# Patient Record
Sex: Male | Born: 1950 | Race: White | Hispanic: No | Marital: Married | Smoking: Former smoker
Health system: Southern US, Community
[De-identification: ages and names within clinical notes are randomized; demographics above are authoritative.]

## PROBLEM LIST (undated history)

## (undated) DIAGNOSIS — I5042 Chronic combined systolic (congestive) and diastolic (congestive) heart failure: Secondary | ICD-10-CM

## (undated) DIAGNOSIS — L989 Disorder of the skin and subcutaneous tissue, unspecified: Secondary | ICD-10-CM

## (undated) DIAGNOSIS — I10 Essential (primary) hypertension: Secondary | ICD-10-CM

## (undated) DIAGNOSIS — G4730 Sleep apnea, unspecified: Secondary | ICD-10-CM

## (undated) DIAGNOSIS — M1710 Unilateral primary osteoarthritis, unspecified knee: Secondary | ICD-10-CM

## (undated) DIAGNOSIS — N529 Male erectile dysfunction, unspecified: Secondary | ICD-10-CM

## (undated) DIAGNOSIS — I4891 Unspecified atrial fibrillation: Secondary | ICD-10-CM

## (undated) DIAGNOSIS — R7301 Impaired fasting glucose: Secondary | ICD-10-CM

## (undated) DIAGNOSIS — I2510 Atherosclerotic heart disease of native coronary artery without angina pectoris: Secondary | ICD-10-CM

## (undated) DIAGNOSIS — K219 Gastro-esophageal reflux disease without esophagitis: Secondary | ICD-10-CM

## (undated) DIAGNOSIS — R5383 Other fatigue: Secondary | ICD-10-CM

## (undated) DIAGNOSIS — E785 Hyperlipidemia, unspecified: Secondary | ICD-10-CM

## (undated) DIAGNOSIS — F329 Major depressive disorder, single episode, unspecified: Secondary | ICD-10-CM

## (undated) DIAGNOSIS — F419 Anxiety disorder, unspecified: Secondary | ICD-10-CM

## (undated) DIAGNOSIS — F32A Depression, unspecified: Secondary | ICD-10-CM

## (undated) HISTORY — DX: Anxiety disorder, unspecified: F41.9

## (undated) HISTORY — DX: Hyperlipidemia, unspecified: E78.5

## (undated) HISTORY — PX: KNEE SURGERY: SHX244

## (undated) HISTORY — DX: Male erectile dysfunction, unspecified: N52.9

## (undated) HISTORY — DX: Major depressive disorder, single episode, unspecified: F32.9

## (undated) HISTORY — DX: Unspecified atrial fibrillation: I48.91

## (undated) HISTORY — DX: Other fatigue: R53.83

## (undated) HISTORY — DX: Unilateral primary osteoarthritis, unspecified knee: M17.10

## (undated) HISTORY — DX: Impaired fasting glucose: R73.01

## (undated) HISTORY — DX: Disorder of the skin and subcutaneous tissue, unspecified: L98.9

## (undated) HISTORY — DX: Depression, unspecified: F32.A

## (undated) HISTORY — DX: Essential (primary) hypertension: I10

## (undated) HISTORY — DX: Atherosclerotic heart disease of native coronary artery without angina pectoris: I25.10

## (undated) HISTORY — PX: HERNIA REPAIR: SHX51

## (undated) HISTORY — PX: WISDOM TOOTH EXTRACTION: SHX21

## (undated) HISTORY — PX: TONSILLECTOMY: SUR1361

## (undated) HISTORY — DX: Chronic combined systolic (congestive) and diastolic (congestive) heart failure: I50.42

---

## 2007-03-04 ENCOUNTER — Emergency Department (HOSPITAL_COMMUNITY): Admission: EM | Admit: 2007-03-04 | Discharge: 2007-03-04 | Payer: Self-pay | Admitting: Emergency Medicine

## 2009-03-02 ENCOUNTER — Encounter (INDEPENDENT_AMBULATORY_CARE_PROVIDER_SITE_OTHER): Payer: Self-pay | Admitting: General Practice

## 2009-12-19 DIAGNOSIS — E785 Hyperlipidemia, unspecified: Secondary | ICD-10-CM

## 2009-12-19 DIAGNOSIS — I10 Essential (primary) hypertension: Secondary | ICD-10-CM

## 2009-12-19 DIAGNOSIS — M179 Osteoarthritis of knee, unspecified: Secondary | ICD-10-CM

## 2009-12-19 DIAGNOSIS — F419 Anxiety disorder, unspecified: Secondary | ICD-10-CM

## 2009-12-19 DIAGNOSIS — F32A Depression, unspecified: Secondary | ICD-10-CM

## 2009-12-19 DIAGNOSIS — R7301 Impaired fasting glucose: Secondary | ICD-10-CM | POA: Insufficient documentation

## 2009-12-19 DIAGNOSIS — Z008 Encounter for other general examination: Secondary | ICD-10-CM | POA: Insufficient documentation

## 2009-12-19 DIAGNOSIS — R5383 Other fatigue: Secondary | ICD-10-CM | POA: Insufficient documentation

## 2009-12-19 DIAGNOSIS — Z79899 Other long term (current) drug therapy: Secondary | ICD-10-CM | POA: Insufficient documentation

## 2009-12-19 DIAGNOSIS — M1710 Unilateral primary osteoarthritis, unspecified knee: Secondary | ICD-10-CM

## 2009-12-19 DIAGNOSIS — K921 Melena: Secondary | ICD-10-CM | POA: Insufficient documentation

## 2009-12-19 HISTORY — DX: Osteoarthritis of knee, unspecified: M17.9

## 2009-12-19 HISTORY — DX: Depression, unspecified: F32.A

## 2009-12-19 HISTORY — DX: Anxiety disorder, unspecified: F41.9

## 2009-12-19 HISTORY — DX: Unilateral primary osteoarthritis, unspecified knee: M17.10

## 2009-12-19 HISTORY — DX: Essential (primary) hypertension: I10

## 2009-12-19 HISTORY — DX: Other fatigue: R53.83

## 2009-12-19 HISTORY — DX: Hyperlipidemia, unspecified: E78.5

## 2009-12-19 HISTORY — DX: Impaired fasting glucose: R73.01

## 2010-06-05 ENCOUNTER — Encounter (INDEPENDENT_AMBULATORY_CARE_PROVIDER_SITE_OTHER): Payer: Self-pay | Admitting: *Deleted

## 2010-07-25 ENCOUNTER — Encounter (INDEPENDENT_AMBULATORY_CARE_PROVIDER_SITE_OTHER): Payer: Self-pay | Admitting: *Deleted

## 2010-07-26 ENCOUNTER — Ambulatory Visit: Payer: Self-pay | Admitting: Gastroenterology

## 2010-07-31 ENCOUNTER — Ambulatory Visit
Admission: RE | Admit: 2010-07-31 | Discharge: 2010-07-31 | Payer: Self-pay | Source: Home / Self Care | Attending: Gastroenterology | Admitting: Gastroenterology

## 2010-08-08 ENCOUNTER — Telehealth: Payer: Self-pay | Admitting: Gastroenterology

## 2010-08-09 ENCOUNTER — Ambulatory Visit: Admit: 2010-08-09 | Payer: Self-pay | Admitting: Gastroenterology

## 2010-08-28 NOTE — Letter (Signed)
Summary: Pre Visit Letter Revised  [facility]  [address] [place], [place] [address]   Phone: [phone]  Fax: [phone]        06/05/2010 MRN: [id number] [facility] [address] [place], [place]  [address]             Procedure Date:  08-09-10   Welcome to the Gastroenterology Division at [facility].    You are scheduled to see a nurse for your pre-procedure visit on 07-26-10 at 8:00a.m. on the 3rd floor at [facility], [address]  We ask that you try to arrive at our office 15 minutes prior to your appointment time to allow for check-in.  Please take a minute to review the attached form.  If you answer "Yes" to one or more of the questions on the first page, we ask that you call the person listed at your earliest opportunity.  If you answer "No" to all of the questions, please complete the rest of the form and bring it to your appointment.    Your nurse visit will consist of discussing your medical and surgical history, your immediate family medical history, and your medications.   If you are unable to list all of your medications on the form, please bring the medication bottles to your appointment and we will list them.  We will need to be aware of both prescribed and over the counter drugs.  We will need to know exact dosage information as well.    Please be prepared to read and sign documents such as consent forms, a financial agreement, and acknowledgement forms.  If necessary, and with your consent, a friend or relative is welcome to sit-in on the nurse visit with you.  Please bring your insurance card so that we may make a copy of it.  If your insurance requires a referral to see a specialist, please bring your referral form from your primary care physician.  No co-pay is required for this nurse visit.     If you cannot keep your appointment, please call [phone] to cancel or reschedule prior to your appointment date.  This  allows [place] the opportunity to schedule an appointment for another patient in need of care.    Thank you for choosing [facility] for your medical needs.  We appreciate the opportunity to care for you.  Please visit [place] at our website  to learn more about our practice.  Sincerely, The Gastroenterology Division

## 2010-08-30 NOTE — Letter (Signed)
Summary: [facility] Instructions  [facility]  [address] [place], [place] [address]   Phone: [phone]  Fax: [phone]       Abdulla Thackeray    Nov 24, 1950    MRN: [id number]        Procedure Day /Date:  Thursday 08/09/2010     Arrival Time: 7:30 am      Procedure Time:  8:30 am     Location of Procedure:                    _ x_  [facility] (4th Floor)                        PREPARATION FOR COLONOSCOPY WITH MOVIPREP   Starting 5 days prior to your procedure Saturday 08/04/10 do not eat nuts, seeds, popcorn, corn, beans, peas,  salads, or any raw vegetables.  Do not take any fiber supplements (e.g. Metamucil, Citrucel, and Benefiber).  THE DAY BEFORE YOUR PROCEDURE         DATE: Wednesday 08/08/10  1.  Drink clear liquids the entire day-NO SOLID FOOD  2.  Do not drink anything colored red or purple.  Avoid juices with pulp.  No orange juice.  3.  Drink at least 64 oz. (8 glasses) of fluid/clear liquids during the day to prevent dehydration and help the prep work efficiently.  CLEAR LIQUIDS INCLUDE: Water Jello Ice Popsicles Tea (sugar ok, no milk/cream) Powdered fruit flavored drinks Coffee (sugar ok, no milk/cream) Gatorade Juice: apple, white grape, white cranberry  Lemonade Clear bullion, consomm, broth Carbonated beverages (any kind) Strained chicken noodle soup Hard Candy                             4.  In the morning, mix first dose of MoviPrep solution:    Empty 1 Pouch A and 1 Pouch B into the disposable container    Add lukewarm drinking water to the top line of the container. Mix to dissolve    Refrigerate (mixed solution should be used within 24 hrs)  5.  Begin drinking the prep at 5:00 p.m. The MoviPrep container is divided by 4 marks.   Every 15 minutes drink the solution down to the next mark (approximately 8 oz) until the full liter is complete.   6.  Follow completed prep with 16 oz of clear liquid of your choice  (Nothing red or purple).  Continue to drink clear liquids until bedtime.  7.  Before going to bed, mix second dose of MoviPrep solution:    Empty 1 Pouch A and 1 Pouch B into the disposable container    Add lukewarm drinking water to the top line of the container. Mix to dissolve    Refrigerate  THE DAY OF YOUR PROCEDURE      DATE: Thursday 08/09/10  Beginning at 3:30 a.m. (5 hours before procedure):         1. Every 15 minutes, drink the solution down to the next mark (approx 8 oz) until the full liter is complete.  2. Follow completed prep with 16 oz. of clear liquid of your choice.    3. You may drink clear liquids until 6:30 am (2 HOURS BEFORE PROCEDURE).   MEDICATION INSTRUCTIONS  Unless otherwise instructed, you should take regular prescription medications with a small sip of water   as early as possible the morning  of your procedure.  Additional medication instruction:  Hold Lisinopril/HCTZ the morning of procedure.         OTHER INSTRUCTIONS  You will need a responsible adult at least 59 years of age to accompany you and drive you home.   This person must remain in the waiting room during your procedure.  Wear loose fitting clothing that is easily removed.  Leave jewelry and other valuables at home.  However, you may wish to bring a book to read or  an iPod/MP3 player to listen to music as you wait for your procedure to start.  Remove all body piercing jewelry and leave at home.  Total time from sign-in until discharge is approximately 2-3 hours.  You should go home directly after your procedure and rest.  You can resume normal activities the  day after your procedure.  The day of your procedure you should not:   Drive   Make legal decisions   Operate machinery   Drink alcohol   Return to work  You will receive specific instructions about eating, activities and medications before you leave.    The above instructions have been reviewed and  explained to me by   Wyona Almas RN  July 31, 2010 10:06 AM     I fully understand and can verbalize these instructions _____________________________ Date _________

## 2010-08-30 NOTE — Miscellaneous (Signed)
Summary: [facility] Previsit/prep  Clinical Lists Changes  Medications: Added new medication of MOVIPREP 100 GM  SOLR (PEG-KCL-NACL-NASULF-NA ASC-C) As per prep instructions. - Signed Rx of MOVIPREP 100 GM  SOLR (PEG-KCL-NACL-NASULF-NA ASC-C) As per prep instructions.;  #1 x 0;  Signed;  Entered by: Wyona Almas RN;  Authorized by: Meryl Dare MD Clementeen Graham;  Method used: Electronically to [id number] [address], [place], [place], [place]  [address], Ph: [phone] or [phone], Fax: [phone] Observations: Added new observation of NKA: T (07/31/2010 9:42)    Prescriptions: MOVIPREP 100 GM  SOLR (PEG-KCL-NACL-NASULF-NA ASC-C) As per prep instructions.  #1 x 0   Entered by:   Wyona Almas RN   Authorized by:   Meryl Dare MD [facility]   Signed by:   Wyona Almas RN on 07/31/2010   Method used:   Electronically to        [facility] [phone]* (retail)       [address]       [place], [place]  [address]       Ph: [phone] or [phone]       Fax: [phone]   RxID:   [id number]

## 2010-08-30 NOTE — Progress Notes (Signed)
Summary: Procedure tomorrow  Phone Note Call from Patient Call back at Home Phone [phone] Call back at Work Phone [phone]   Call For: Dr Russella Dar Summary of Call: Scheduled for a procedure tomorrow and his friend at work told him he was unable to drink the moviprep. Wants to speak to nurse because he thinks he wont be able to drink it either. Initial call taken by: Leanor Kail [facility],  August 08, 2010 10:39 AM  Follow-up for Phone Call        instructed pt. to please try to drink prep that he may be able to drink it even though his friend was not able to drink it and that if he is having trouble getting it down in the . intervals that he could drink it at a slower pace and if he has a problem than call the on call Dr. if having any problems with prep. Pt. stated that he would try. Follow-up by: Greer Ee RN,  August 08, 2010 1:16 PM

## 2010-09-05 NOTE — Progress Notes (Signed)
Summary: Canceled COL  Phone Note Call from Patient Call back at [facility] Phone [phone]   Caller: Patient Call For: Dr. Russella Dar Summary of Call: pt. canceled his COL for tomorrow b/c his deductible is too high and he cannot afford it right now. Would you like pt. charge the cancelation fee? Initial call taken by: Karna Christmas,  August 08, 2010 4:58 PM  Follow-up for Phone Call        Yes to charging cancellation fee. Follow-up by: Meryl Dare MD FACG,  August 08, 2010 10:29 PM  Additional Follow-up for Phone Call Additional follow up Details #1::        Patient BILLED.  Additional Follow-up by: Leanor Kail [facility],  August 28, 2010 5:03 PM

## 2010-11-16 DIAGNOSIS — M79643 Pain in unspecified hand: Secondary | ICD-10-CM | POA: Insufficient documentation

## 2011-05-13 LAB — I-STAT 8, (EC8 V) (CONVERTED LAB)
Acid-Base Excess: 2
Bicarbonate: 25.8 — ABNORMAL HIGH
Chloride: 103
HCT: 43
Operator id: 282201
TCO2: 27
pCO2, Ven: 36.2 — ABNORMAL LOW

## 2011-05-13 LAB — CBC
HCT: 41.2
Hemoglobin: 14.2
Platelets: 219
WBC: 4.9

## 2011-05-13 LAB — POCT CARDIAC MARKERS
CKMB, poc: 4.8
Operator id: 282201
Troponin i, poc: <0.05

## 2011-05-13 LAB — DIFFERENTIAL
Eosinophils Relative: 3
Lymphocytes Relative: 27
Lymphs Abs: 1.4

## 2011-05-13 LAB — POCT I-STAT CREATININE: Operator id: 282201

## 2011-08-16 DIAGNOSIS — J111 Influenza due to unidentified influenza virus with other respiratory manifestations: Secondary | ICD-10-CM | POA: Insufficient documentation

## 2011-09-02 ENCOUNTER — Encounter: Payer: Self-pay | Admitting: Gastroenterology

## 2011-09-02 DIAGNOSIS — L989 Disorder of the skin and subcutaneous tissue, unspecified: Secondary | ICD-10-CM

## 2011-09-02 DIAGNOSIS — N529 Male erectile dysfunction, unspecified: Secondary | ICD-10-CM

## 2011-09-02 HISTORY — DX: Male erectile dysfunction, unspecified: N52.9

## 2011-09-02 HISTORY — DX: Disorder of the skin and subcutaneous tissue, unspecified: L98.9

## 2011-09-26 ENCOUNTER — Encounter: Payer: Self-pay | Admitting: Gastroenterology

## 2011-10-16 DIAGNOSIS — R42 Dizziness and giddiness: Secondary | ICD-10-CM | POA: Insufficient documentation

## 2013-08-09 DIAGNOSIS — R05 Cough: Secondary | ICD-10-CM | POA: Insufficient documentation

## 2013-08-09 DIAGNOSIS — R059 Cough, unspecified: Secondary | ICD-10-CM | POA: Insufficient documentation

## 2013-09-20 DIAGNOSIS — Z1389 Encounter for screening for other disorder: Secondary | ICD-10-CM | POA: Insufficient documentation

## 2013-09-20 DIAGNOSIS — I4891 Unspecified atrial fibrillation: Secondary | ICD-10-CM

## 2013-09-20 DIAGNOSIS — I480 Paroxysmal atrial fibrillation: Secondary | ICD-10-CM | POA: Insufficient documentation

## 2013-09-20 HISTORY — DX: Unspecified atrial fibrillation: I48.91

## 2013-09-21 ENCOUNTER — Institutional Professional Consult (permissible substitution): Payer: Self-pay | Admitting: Cardiology

## 2013-09-22 ENCOUNTER — Ambulatory Visit (INDEPENDENT_AMBULATORY_CARE_PROVIDER_SITE_OTHER): Payer: BC Managed Care – PPO | Admitting: Cardiovascular Disease

## 2013-09-22 ENCOUNTER — Other Ambulatory Visit: Payer: Self-pay | Admitting: Cardiology

## 2013-09-22 ENCOUNTER — Other Ambulatory Visit: Payer: Self-pay | Admitting: *Deleted

## 2013-09-22 ENCOUNTER — Encounter: Payer: Self-pay | Admitting: Cardiovascular Disease

## 2013-09-22 VITALS — BP 120/84 | HR 76 | Ht 68.0 in | Wt 195.0 lb

## 2013-09-22 DIAGNOSIS — I10 Essential (primary) hypertension: Secondary | ICD-10-CM

## 2013-09-22 DIAGNOSIS — I4891 Unspecified atrial fibrillation: Secondary | ICD-10-CM

## 2013-09-22 LAB — T4, FREE: FREE T4: 0.9 ng/dL (ref 0.60–1.60)

## 2013-09-22 LAB — TSH: TSH: 1.28 u[IU]/mL (ref 0.35–5.50)

## 2013-09-22 LAB — BASIC METABOLIC PANEL
BUN: 19 mg/dL (ref 6–23)
CHLORIDE: 102 meq/L (ref 96–112)
CO2: 27 mEq/L (ref 19–32)
CREATININE: 1 mg/dL (ref 0.4–1.5)
Calcium: 9.2 mg/dL (ref 8.4–10.5)
GFR: 81.43 mL/min (ref >60.00)
Glucose, Bld: 76 mg/dL (ref 70–99)
POTASSIUM: 3.2 meq/L — AB (ref 3.5–5.1)
SODIUM: 137 meq/L (ref 135–145)

## 2013-09-22 LAB — CBC WITH DIFFERENTIAL/PLATELET
BASOS ABS: 0 10*3/uL (ref 0.0–0.1)
Basophils Relative: 0.3 % (ref 0.0–3.0)
EOS ABS: 0.1 10*3/uL (ref 0.0–0.7)
Eosinophils Relative: 1.5 % (ref 0.0–5.0)
HCT: 46.8 % (ref 39.0–52.0)
Hemoglobin: 15.3 g/dL (ref 13.0–17.0)
LYMPHS PCT: 25.9 % (ref 12.0–46.0)
Lymphs Abs: 1.4 10*3/uL (ref 0.7–4.0)
MCHC: 32.6 g/dL (ref 30.0–36.0)
MCV: 97.1 fl (ref 78.0–100.0)
Monocytes Absolute: 0.5 10*3/uL (ref 0.1–1.0)
Monocytes Relative: 9.8 % (ref 3.0–12.0)
NEUTROS PCT: 62.5 % (ref 43.0–77.0)
Neutro Abs: 3.3 10*3/uL (ref 1.4–7.7)
PLATELETS: 221 10*3/uL (ref 150.0–400.0)
RBC: 4.82 Mil/uL (ref 4.22–5.81)
RDW: 12.9 % (ref 11.5–14.6)
WBC: 5.3 10*3/uL (ref 4.5–10.5)

## 2013-09-22 MED ORDER — APIXABAN 5 MG PO TABS: 5.0000 mg | ORAL_TABLET | Freq: Two times a day (BID) | ORAL | Status: DC

## 2013-09-22 NOTE — Assessment & Plan Note (Signed)
Well controlled.  Continue current medications and low sodium Dash type diet.    

## 2013-09-22 NOTE — Progress Notes (Signed)
Patient ID: Juan Montes, male   DOB: 04/04/1951, 63 y.o.   MRN: [id number]  63 yo patient of Dr Abner Greenspan referred for afib.  No history of cardiac issues.  Over 10 years ago went to [facility] for chest pain but was hypokalemic and thought due to cramps.  Had URI couple of months ago and has not felt well since. Fatigue dyspnea and dizziness .  Doesn't have stamina.  Does not note palpitations. Started on ASA and metoprolol  CHADVASC score 1.  ETOH most nights but only a beer.  Chest gets tight during day but seems more related to fatigue than exertion No history of MI.  No cough fever Still has head congestion No family history of DCM.  No edema or swelling.  No bleeding issues or contraindications to anticoagulation  Has well Rx HTN     ROS: Denies fever, malais, weight loss, blurry vision, decreased visual acuity, cough, sputum, SOB, hemoptysis, pleuritic pain, palpitaitons, heartburn, abdominal pain, melena, lower extremity edema, claudication, or rash.  All other systems reviewed and negative   General: Affect appropriate Healthy:  appears stated age 63: normal Neck supple with no adenopathy JVP normal no bruits no thyromegaly Lungs clear with no wheezing and good diaphragmatic motion Heart:  S1/S2 no murmur,rub, gallop or click PMI normal Abdomen: benighn, BS positve, no tenderness, no AAA previous right hernia repair  no bruit.  No HSM or HJR Distal pulses intact with no bruits No edema Neuro non-focal Skin warm and dry No muscular weakness Previous left knee surgery   Medications Current Outpatient Prescriptions  Medication Sig Dispense Refill  . lisinopril-hydrochlorothiazide (PRINZIDE,ZESTORETIC) 20-25 MG per tablet Take 1 tablet by mouth daily.      . metoprolol tartrate (LOPRESSOR) 25 MG tablet Take 25 mg by mouth 2 (two) times daily.      . sertraline (ZOLOFT) 100 MG tablet 100 mg daily.      Marland Kitchen apixaban (ELIQUIS) 5 MG TABS tablet Take 1 tablet (5 mg total) by mouth 2 (two)  times daily.  60 tablet  3   No current facility-administered medications for this visit.    Allergies Review of patient's allergies indicates not on file.  Family History: Family History  Problem Relation Age of Onset  . Hypertension Father   . Kidney Stones Mother   . Other Father     kidney problems    Social History: History   Social History  . Marital Status: Married    Spouse Name: N/A    Number of Children: N/A  . Years of Education: N/A   Occupational History  . Not on file.   Social History Main Topics  . Smoking status: Light Tobacco Smoker  . Smokeless tobacco: Not on file     Comment: occasional cigar  . Alcohol Use: No  . Drug Use: No  . Sexual Activity: Not on file   Other Topics Concern  . Not on file   Social History Narrative  . No narrative on file    Electrocardiogram: afib rate controlled no ST/ T wave changes   Assessment and Plan

## 2013-09-22 NOTE — Patient Instructions (Signed)
Your physician recommends that you schedule a follow-up appointment in:  NEXT AVAILABLE WITH DR [facility]  Your physician has recommended you make the following change in your medication:  START  ELIQUIS   5 MG  Reader physician has requested that you have an echocardiogram. Echocardiography is a painless test that uses sound waves to create images of your heart. It provides your doctor with information about the size and shape of your heart and how well your heart's chambers and valves are working. This procedure takes approximately one hour. There are no restrictions for this procedure.  Your physician recommends that you return for lab work in:  TODAY  BMET  CBC   TSH  FREE T4

## 2013-09-22 NOTE — Assessment & Plan Note (Signed)
Long discussion regarding diagnosis , Rx and risk of stroke.  Try to limit ETOH  Check labs to include CBC, BMET and TSH.  Echo to assess EF and atrial size  Concerned that he may have DCM given fatigue and dyspnea.  Start Eliquis with eye torward [place] in 4 weeks.  Will need ETT at some point but prefer to see what echo shows and take care of afib first.

## 2013-09-22 NOTE — Addendum Note (Signed)
Addended by: Phineas Inches D on: 09/22/2013 01:53 PM   Modules accepted: Orders

## 2013-09-28 ENCOUNTER — Institutional Professional Consult (permissible substitution): Payer: Self-pay | Admitting: Internal Medicine

## 2013-10-08 ENCOUNTER — Encounter: Payer: Self-pay | Admitting: Cardiology

## 2013-10-08 ENCOUNTER — Ambulatory Visit (HOSPITAL_COMMUNITY): Payer: BC Managed Care – PPO | Attending: Cardiology | Admitting: Cardiology

## 2013-10-08 DIAGNOSIS — I4891 Unspecified atrial fibrillation: Secondary | ICD-10-CM

## 2013-10-08 NOTE — Progress Notes (Signed)
Echo performed. 

## 2013-10-11 ENCOUNTER — Other Ambulatory Visit: Payer: Self-pay | Admitting: *Deleted

## 2013-10-11 DIAGNOSIS — I8290 Acute embolism and thrombosis of unspecified vein: Secondary | ICD-10-CM

## 2013-10-19 ENCOUNTER — Ambulatory Visit (INDEPENDENT_AMBULATORY_CARE_PROVIDER_SITE_OTHER): Payer: BC Managed Care – PPO | Admitting: Cardiovascular Disease

## 2013-10-19 ENCOUNTER — Encounter: Payer: Self-pay | Admitting: Cardiovascular Disease

## 2013-10-19 VITALS — BP 130/86 | HR 59 | Ht 68.0 in | Wt 198.0 lb

## 2013-10-19 DIAGNOSIS — R0989 Other specified symptoms and signs involving the circulatory and respiratory systems: Secondary | ICD-10-CM

## 2013-10-19 DIAGNOSIS — I10 Essential (primary) hypertension: Secondary | ICD-10-CM

## 2013-10-19 DIAGNOSIS — R0600 Dyspnea, unspecified: Secondary | ICD-10-CM | POA: Insufficient documentation

## 2013-10-19 DIAGNOSIS — R0609 Other forms of dyspnea: Secondary | ICD-10-CM

## 2013-10-19 DIAGNOSIS — I4891 Unspecified atrial fibrillation: Secondary | ICD-10-CM

## 2013-10-19 NOTE — Assessment & Plan Note (Signed)
Well controlled.  Continue current medications and low sodium Dash type diet.    

## 2013-10-19 NOTE — Progress Notes (Signed)
Patient ID: Juan Montes, male   DOB: [birth year], 63 y.o.   MRN: [id number] 63 yo patient of Dr Abner Greenspan referred for afib. No history of cardiac issues. Over 10 years ago went to [facility] for chest pain but was hypokalemic and thought due to cramps. Had URI couple of months ago and has not felt well since. Fatigue dyspnea and dizziness . Doesn't have stamina. Does not note palpitations. Started on ASA and metoprolol CHADVASC score 1. ETOH most nights but only a beer. Chest gets tight during day but seems more related to fatigue than exertion No history of MI. No cough fever Still has head congestion  No family history of DCM. No edema or swelling. No bleeding issues or contraindications to anticoagulation Has well Rx HTN    F/u echo 3/23  With ? Apical thrombus EF 50-55%   Started on Eliquis last visit.  Has converted to NSR   No further pain, dyspnea or fatigue  MRI has not been scheduled yet     ROS: Denies fever, malais, weight loss, blurry vision, decreased visual acuity, cough, sputum, SOB, hemoptysis, pleuritic pain, palpitaitons, heartburn, abdominal pain, melena, lower extremity edema, claudication, or rash.  All other systems reviewed and negative  General: Affect appropriate Healthy:  appears stated age 63: normal Neck supple with no adenopathy JVP normal no bruits no thyromegaly Lungs clear with no wheezing and good diaphragmatic motion Heart:  S1/S2 no murmur, no rub, gallop or click PMI normal Abdomen: benighn, BS positve, no tenderness, no AAA no bruit.  No HSM or HJR Distal pulses intact with no bruits No edema Neuro non-focal Skin warm and dry No muscular weakness   Current Outpatient Prescriptions  Medication Sig Dispense Refill  . apixaban (ELIQUIS) 5 MG TABS tablet Take 1 tablet (5 mg total) by mouth 2 (two) times daily.  60 tablet  1  . calcium carbonate (TUMS) 500 MG chewable tablet 500 mg.      . lisinopril-hydrochlorothiazide (PRINZIDE,ZESTORETIC) 20-25 MG  per tablet Take 1 tablet by mouth daily.      . metoprolol tartrate (LOPRESSOR) 25 MG tablet Take 25 mg by mouth 2 (two) times daily.      . sertraline (ZOLOFT) 100 MG tablet 100 mg daily.       No current facility-administered medications for this visit.    Allergies  Celebrex  Electrocardiogram:  NSR rate 59  Normal ECG   Assessment and Plan

## 2013-10-19 NOTE — Assessment & Plan Note (Signed)
Dyspnea with EF 50-55% on echo CRF;s  F/U ETT to r/o CAD and assess BP response to exercise

## 2013-10-19 NOTE — Patient Instructions (Addendum)
Your physician recommends that you schedule a follow-up appointment in:  3 months with [place]  Your physician has requested that you have an exercise tolerance test. Can be done with NP or PA.  For further information please visit [email or web address]. Please also follow instruction sheet, as given.   Your physician has requested that you have a cardiac MRI. Cardiac MRI uses a computer to create images of your heart as its beating, producing both still and moving pictures of your heart and major blood vessels. For further information please visit [email or web address] Please follow the instruction sheet given to you today for more information. Scheduled for October 20, 2013. Please arrive at [facility] at 11:45.

## 2013-10-19 NOTE — Assessment & Plan Note (Signed)
Converted to NSR  Need cardiac MRI to r/o thrombus before stopping eliquis  Would be unusual to have this without distinct RWMA  Can also assess LAA given recent PAF

## 2013-10-20 ENCOUNTER — Ambulatory Visit (HOSPITAL_COMMUNITY)
Admission: RE | Admit: 2013-10-20 | Discharge: 2013-10-20 | Disposition: A | Discharge status: Home / Self Care | Payer: BC Managed Care – PPO | Source: Ambulatory Visit | Attending: Cardiovascular Disease | Admitting: Cardiovascular Disease

## 2013-10-20 DIAGNOSIS — I749 Embolism and thrombosis of unspecified artery: Secondary | ICD-10-CM

## 2013-10-20 DIAGNOSIS — I517 Cardiomegaly: Secondary | ICD-10-CM | POA: Insufficient documentation

## 2013-10-20 DIAGNOSIS — I8290 Acute embolism and thrombosis of unspecified vein: Secondary | ICD-10-CM

## 2013-10-20 MED ORDER — GADOBENATE DIMEGLUMINE 529 MG/ML IV SOLN
30.0000 mL | Freq: Once | INTRAVENOUS | Status: AC | PRN
  Administered 2013-10-20: 30 mL via INTRAVENOUS

## 2013-11-03 ENCOUNTER — Ambulatory Visit (INDEPENDENT_AMBULATORY_CARE_PROVIDER_SITE_OTHER): Payer: BC Managed Care – PPO

## 2013-11-03 DIAGNOSIS — R0609 Other forms of dyspnea: Secondary | ICD-10-CM

## 2013-11-03 DIAGNOSIS — R0600 Dyspnea, unspecified: Secondary | ICD-10-CM

## 2013-11-03 DIAGNOSIS — R0989 Other specified symptoms and signs involving the circulatory and respiratory systems: Secondary | ICD-10-CM

## 2013-11-03 DIAGNOSIS — I4891 Unspecified atrial fibrillation: Secondary | ICD-10-CM

## 2013-11-03 NOTE — Progress Notes (Signed)
Exercise Treadmill Test  Pre-Exercise Testing Evaluation Rhythm: normal sinus  Rate: 74 bpm     Test  Exercise Tolerance Test Ordering MD: Jenkins Rouge, MD  Interpreting MD: Melina Copa, PA  Unique Test No: 1  Treadmill:  1  Indication for ETT: exertional dyspnea/afib  Contraindication to ETT: No   Stress Modality: exercise - treadmill  Cardiac Imaging Performed: non   Protocol: standard Bruce - maximal  Max BP:  200/102  Max MPHR (bpm):  158 85% MPR (bpm):  134  MPHR obtained (bpm):  164 % MPHR obtained:  103%  Reached 85% MPHR (min:sec):  05:34 Total Exercise Time (min-sec):  08:59  Workload in METS:  10.1 Borg Scale: 17  Reason ETT Terminated:  dyspnea, history of hip pain, target HR achieved    ST Segment Analysis At Rest: normal ST segments - no evidence of significant ST depression, rare PVC With Exercise: no evidence of significant ST depression  Other Information Arrhythmia:  No sustained arrhythmias - rare PVC, one couplet  Angina during ETT:  absent (0) Quality of ETT:  diagnostic  ETT Interpretation:  normal - no evidence of ischemia by ST analysis  Comments: Excellent exercise tolerance.  Minor upsloping ST depression (<0.16mm) inf-lat but no significant ST changes suggestive of ischemia. No ST elevation. No chest pain. Mild dyspnea towards end of test but still able to conversate. Hypertensive response to exercise at very end. Rare PVC. Patient was asymptomatic in recovery.  Recommendations: No significant changes or symptoms to suggest ischemia. Followup with Dr. Johnsie Cancel as planned.

## 2013-11-11 ENCOUNTER — Other Ambulatory Visit (HOSPITAL_COMMUNITY): Payer: BC Managed Care – PPO

## 2013-12-03 ENCOUNTER — Telehealth: Payer: Self-pay | Admitting: Cardiovascular Disease

## 2013-12-03 ENCOUNTER — Encounter: Payer: Self-pay | Admitting: Cardiovascular Disease

## 2013-12-03 NOTE — Telephone Encounter (Signed)
error 

## 2014-01-18 ENCOUNTER — Encounter: Payer: Self-pay | Admitting: Cardiovascular Disease

## 2014-01-19 ENCOUNTER — Ambulatory Visit (INDEPENDENT_AMBULATORY_CARE_PROVIDER_SITE_OTHER): Payer: BC Managed Care – PPO | Admitting: Cardiovascular Disease

## 2014-01-19 ENCOUNTER — Encounter (INDEPENDENT_AMBULATORY_CARE_PROVIDER_SITE_OTHER): Payer: Self-pay

## 2014-01-19 ENCOUNTER — Encounter: Payer: Self-pay | Admitting: Cardiovascular Disease

## 2014-01-19 VITALS — BP 140/90 | HR 62 | Ht 68.0 in | Wt 190.1 lb

## 2014-01-19 DIAGNOSIS — E785 Hyperlipidemia, unspecified: Secondary | ICD-10-CM

## 2014-01-19 DIAGNOSIS — I10 Essential (primary) hypertension: Secondary | ICD-10-CM

## 2014-01-19 DIAGNOSIS — I480 Paroxysmal atrial fibrillation: Secondary | ICD-10-CM

## 2014-01-19 DIAGNOSIS — I4891 Unspecified atrial fibrillation: Secondary | ICD-10-CM

## 2014-01-19 NOTE — Patient Instructions (Signed)
Your physician wants you to follow-up in:  6 MONTHS WITH DR NISHAN  You will receive a reminder letter in the mail two months in advance. If you don't receive a letter, please call our office to schedule the follow-up appointment. Your physician recommends that you continue on your current medications as directed. Please refer to the Current Medication list given to you today. 

## 2014-01-19 NOTE — Assessment & Plan Note (Signed)
Well controlled.  Continue current medications and low sodium Dash type diet.    

## 2014-01-19 NOTE — Assessment & Plan Note (Signed)
Cholesterol is at goal.  Continue current dose of statin and diet Rx.  No myalgias or side effects.  F/U  LFT's in 6 months. No results found for this basename: LDLCALC  Labs with primary            

## 2014-01-19 NOTE — Assessment & Plan Note (Signed)
Maint NSR  Continue asa and beta blocker He is cutting back on the beer

## 2014-01-19 NOTE — Progress Notes (Signed)
Patient ID: Juan Montes, male   DOB: Nov 01, 1950, 63 y.o.   MRN: [id number] 63 yo patient of Dr Abner Greenspan referred for afib. No history of cardiac issues. Over 10 years ago went to [facility] for chest pain but was hypokalemic and thought due to cramps. Had URI couple of months ago and has not felt well since. Fatigue dyspnea and dizziness . Doesn't have stamina. Does not note palpitations. Started on ASA and metoprolol CHADVASC score 1. ETOH most nights but only a beer. Chest gets tight during day but seems more related to fatigue than exertion No history of MI. No cough fever Still has head congestion  No family history of DCM. No edema or swelling. No bleeding issues or contraindications to anticoagulation Has well Rx HTN  F/u echo 3/23 With ? Apical thrombus EF 50-55%  Started on Eliquis  converted to NSR No further pain, dyspnea or fatigue   MRI 3/16  EF 54% no apical thrombus    On ASA now   Normal ETT 4/15    Daughter getting married Saturday and son who is assoc. Pastor at church in [place] coming home for wedding   Works at [employer] and thinking of slowing down and working at [employer] or [employer]: Denies fever, malais, weight loss, blurry vision, decreased visual acuity, cough, sputum, SOB, hemoptysis, pleuritic pain, palpitaitons, heartburn, abdominal pain, melena, lower extremity edema, claudication, or rash.  All other systems reviewed and negative  General: Affect appropriate Healthy:  appears stated age 89: normal Neck supple with no adenopathy JVP normal no bruits no thyromegaly Lungs clear with no wheezing and good diaphragmatic motion Heart:  S1/S2 no murmur, no rub, gallop or click PMI normal Abdomen: benighn, BS positve, no tenderness, no AAA no bruit.  No HSM or HJR Distal pulses intact with no bruits No edema Neuro non-focal Skin warm and dry No muscular weakness   Current Outpatient Prescriptions  Medication Sig Dispense Refill  . aspirin  325 MG tablet Take 325 mg by mouth daily.      . calcium carbonate (TUMS) 500 MG chewable tablet 500 mg as needed.       Marland Kitchen lisinopril-hydrochlorothiazide (PRINZIDE,ZESTORETIC) 20-25 MG per tablet Take 1 tablet by mouth daily.      . metoprolol tartrate (LOPRESSOR) 25 MG tablet Take 25 mg by mouth 2 (two) times daily.      . sertraline (ZOLOFT) 100 MG tablet 100 mg daily.       No current facility-administered medications for this visit.    Allergies  Celebrex  Electrocardiogram:  ECG SR normal   Assessment and Plan

## 2015-03-28 ENCOUNTER — Other Ambulatory Visit: Payer: Self-pay | Admitting: Internal Medicine

## 2015-03-28 ENCOUNTER — Ambulatory Visit (HOSPITAL_COMMUNITY)
Admission: RE | Admit: 2015-03-28 | Discharge: 2015-03-28 | Disposition: A | Discharge status: Home / Self Care | Payer: BLUE CROSS/BLUE SHIELD | Source: Ambulatory Visit | Attending: Vascular Surgery | Admitting: Vascular Surgery

## 2015-03-28 ENCOUNTER — Other Ambulatory Visit: Payer: Self-pay | Admitting: Vascular Surgery

## 2015-03-28 DIAGNOSIS — M79604 Pain in right leg: Secondary | ICD-10-CM

## 2015-03-28 DIAGNOSIS — I82811 Embolism and thrombosis of superficial veins of right lower extremities: Secondary | ICD-10-CM | POA: Insufficient documentation

## 2016-11-22 DIAGNOSIS — S069X9A Unspecified intracranial injury with loss of consciousness of unspecified duration, initial encounter: Secondary | ICD-10-CM | POA: Diagnosis not present

## 2016-11-22 DIAGNOSIS — S0190XA Unspecified open wound of unspecified part of head, initial encounter: Secondary | ICD-10-CM | POA: Diagnosis not present

## 2016-11-22 DIAGNOSIS — S098XXA Other specified injuries of head, initial encounter: Secondary | ICD-10-CM | POA: Diagnosis not present

## 2016-11-22 DIAGNOSIS — W19XXXA Unspecified fall, initial encounter: Secondary | ICD-10-CM | POA: Diagnosis not present

## 2016-11-22 DIAGNOSIS — S0181XA Laceration without foreign body of other part of head, initial encounter: Secondary | ICD-10-CM | POA: Diagnosis not present

## 2016-11-22 DIAGNOSIS — Y9301 Activity, walking, marching and hiking: Secondary | ICD-10-CM | POA: Diagnosis not present

## 2016-11-22 DIAGNOSIS — S0101XA Laceration without foreign body of scalp, initial encounter: Secondary | ICD-10-CM | POA: Diagnosis not present

## 2016-11-22 DIAGNOSIS — W1781XA Fall down embankment (hill), initial encounter: Secondary | ICD-10-CM | POA: Diagnosis not present

## 2016-12-11 ENCOUNTER — Encounter (HOSPITAL_COMMUNITY): Payer: Self-pay

## 2016-12-11 ENCOUNTER — Emergency Department (HOSPITAL_COMMUNITY)
Admission: EM | Admit: 2016-12-11 | Discharge: 2016-12-11 | Disposition: A | Discharge status: Home / Self Care | Payer: PPO | Attending: Emergency Medicine | Admitting: Emergency Medicine

## 2016-12-11 DIAGNOSIS — Z203 Contact with and (suspected) exposure to rabies: Secondary | ICD-10-CM | POA: Diagnosis not present

## 2016-12-11 DIAGNOSIS — Z23 Encounter for immunization: Secondary | ICD-10-CM | POA: Insufficient documentation

## 2016-12-11 DIAGNOSIS — Z87891 Personal history of nicotine dependence: Secondary | ICD-10-CM | POA: Insufficient documentation

## 2016-12-11 DIAGNOSIS — I10 Essential (primary) hypertension: Secondary | ICD-10-CM | POA: Insufficient documentation

## 2016-12-11 DIAGNOSIS — Z79899 Other long term (current) drug therapy: Secondary | ICD-10-CM | POA: Insufficient documentation

## 2016-12-11 DIAGNOSIS — Z7982 Long term (current) use of aspirin: Secondary | ICD-10-CM | POA: Insufficient documentation

## 2016-12-11 DIAGNOSIS — Z209 Contact with and (suspected) exposure to unspecified communicable disease: Secondary | ICD-10-CM

## 2016-12-11 MED ORDER — RABIES VACCINE, PCEC IM SUSR
1.0000 mL | Freq: Once | INTRAMUSCULAR | Status: AC
  Administered 2016-12-11: 1 mL via INTRAMUSCULAR
  Filled 2016-12-11: qty 1

## 2016-12-11 MED ORDER — RABIES IMMUNE GLOBULIN 150 UNIT/ML IM INJ
20.0000 [IU]/kg | INJECTION | Freq: Once | INTRAMUSCULAR | Status: AC
  Administered 2016-12-11: 1650 [IU] via INTRAMUSCULAR
  Filled 2016-12-11: qty 11
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
## 2016-12-11 NOTE — ED Provider Notes (Signed)
[facility] DEPT Provider Note   CSN: [id number] Arrival date & time: 12/11/16  1828  By signing my name below, I, Margit Banda, attest that this documentation has been prepared under the direction and in the presence of Carmon Sails, PA-C.  Electronically Signed: Margit Banda, ED Scribe. 12/11/16. 7:37 PM.  History   Chief Complaint No chief complaint on file.   HPI Juan Montes is a 66 y.o. male who presents to the Emergency Department complaining of a possible bat bite that occurred during the night of 12/10/16. Per pt's wife, she was woken up by an unknown sound. She originally thought their cats had caught something, after searching the house she found something in their master bedroom shower. She proceed to wake up her husband and he went to see what it was. When he realized it was a bat he made sure it was contained in the shower and was unable to get out. They called animal control in the morning and who came to remove the bat. Bat was released and was not tested or quarantined. Pt was unsure if they were bitten or scratched and were told by their PCP to come to the ED to get checked out. Pt denies rash, or scrapes.  The history is provided by the patient and the spouse. No language interpreter was used.    Past Medical History:  Diagnosis Date  . A-fib ([place]) 09/20/2013  . Anxiety disorder 12/19/2009  . Arthritis of knee, degenerative 12/19/2009  . BP (high blood pressure) 12/19/2009  . Clinical depression 12/19/2009  . Depression   . 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 Elevated fasting blood sugar 12/19/2009  . Fatigue 12/19/2009  . HLD (hyperlipidemia) 12/19/2009  . HTN (hypertension)     Patient Active Problem List   Diagnosis Date Noted  . Dyspnea 10/19/2013  . A-fib ([place]) 09/20/2013  . Screening for gout 09/20/2013  . Cough 08/09/2013  . Dizziness 10/16/2011  . ED (erectile dysfunction) of organic origin  09/02/2011  . 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 Influenza-like illness 08/16/2011  . Hand discomfort 11/16/2010  . Anxiety disorder 12/19/2009  . Blood in feces 12/19/2009  . Clinical depression 12/19/2009  . Encounter for long-term (current) use of other medications 12/19/2009  . Fatigue 12/19/2009  . Other and unspecified general psychiatric examination 12/19/2009  . HLD (hyperlipidemia) 12/19/2009  . BP (high blood pressure) 12/19/2009  . Elevated fasting blood sugar 12/19/2009  . Arthritis of knee, degenerative 12/19/2009    History reviewed. No pertinent surgical history.     Home Medications    Prior to Admission medications   Medication Sig Start Date End Date Taking? Authorizing Provider  aspirin 325 MG tablet Take 325 mg by mouth daily.    [provider]  calcium carbonate (TUMS) 500 MG chewable tablet 500 mg as needed.  12/19/09   [provider]  lisinopril-hydrochlorothiazide (PRINZIDE,ZESTORETIC) 20-25 MG per tablet Take 1 tablet by mouth daily. 11/23/12   [provider]  metoprolol tartrate (LOPRESSOR) 25 MG tablet Take 25 mg by mouth 2 (two) times daily.    [provider]  sertraline (ZOLOFT) 100 MG tablet 100 mg daily. 08/08/10   [provider]    Family History Family History  Problem Relation Age of Onset  . Kidney Stones Mother   . Hypertension Father   . Other Father        kidney problems    Social History Social  History  Substance Use Topics  . Smoking status: Former Research scientist (life sciences)  . Smokeless tobacco: Never Used     Comment: Quit about 3-4 months ago the occasional cigar  . Alcohol use No     Allergies   Celebrex [celecoxib]   Review of Systems Review of Systems  Constitutional: Negative for chills and fever.  HENT: Negative for congestion and sore throat.   Eyes: Negative for visual disturbance.  Respiratory: Negative for cough, chest tightness and shortness of breath.   Cardiovascular:  Negative for chest pain.  Gastrointestinal: Negative for abdominal pain, constipation, diarrhea, nausea and vomiting.  Genitourinary: Negative for decreased urine volume and difficulty urinating.  Musculoskeletal: Negative for arthralgias and joint swelling.  Skin: Negative for rash.  Neurological: Negative for dizziness, light-headedness and headaches.     Physical Exam Updated Vital Signs BP (!) 139/96 (BP Location: Right Arm)   Pulse 70   Temp 98.9 F (37.2 C) (Oral)   Resp 17   Wt 83.9 kg   SpO2 96%   BMI 28.13 kg/m   Physical Exam  Constitutional: He is oriented to person, place, and time. He appears well-developed and well-nourished. No distress.  HENT:  Head: Normocephalic and atraumatic.  Perioral and intraoral mucus membranes are normal.   Eyes: Conjunctivae and EOM are normal. No scleral icterus.  Neck: Normal range of motion.  Cardiovascular: Normal rate, regular rhythm, normal heart sounds and intact distal pulses.   No murmur heard. Pulmonary/Chest: Effort normal and breath sounds normal. He has no wheezes.  Musculoskeletal: Normal range of motion. He exhibits no deformity.  Lymphadenopathy:    He has no cervical adenopathy.  Neurological: He is alert and oriented to person, place, and time.  Skin: Skin is warm and dry. Capillary refill takes less than 2 seconds.  Thorough skin check performed of anterior and posterior trunk, upper and lower extremities reviewed, and no signs of bites or scratches.   Psychiatric: He has a normal mood and affect. His behavior is normal. Judgment and thought content normal.  Nursing note and vitals reviewed.    ED Treatments / Results  DIAGNOSTIC STUDIES: Oxygen Saturation is 96% on RA, adequate by my interpretation.   COORDINATION OF CARE: 7:18 PM-Discussed next steps with pt. Pt verbalized understanding and is agreeable with the plan.   Labs (all labs ordered are listed, but only abnormal results are displayed) Labs  Reviewed - No data to display  EKG  EKG Interpretation None       Radiology No results found.  Procedures Procedures (including critical care time)  Medications Ordered in ED Medications  rabies immune globulin (HYPERAB) injection 1,650 Units (1,650 Units Intramuscular Given 12/11/16 2048)  rabies vaccine (RABAVERT) injection 1 mL (1 mL Intramuscular Given 12/11/16 2046)     Initial Impression / Assessment and Plan / ED Course  I have reviewed the triage vital signs and the nursing notes.  Pertinent labs & imaging results that were available during my care of the patient were reviewed by me and considered in my medical decision making (see chart for details).    66 yo male presents to ED with exposure to live bat. Patient and wife woke up to bat flying in bedroom/bathroom.  Unable to r/o bite or scratch. No signs of bites or scratches in thorough skin check.  PCP sent patient to ED for rabies ppx. Discussed risks vs benefit of rabies ppx.  Patient ultimately chose to start rabies prophylaxis today.  Rabies IG and vaccine  given today.  Gave patient rabies schedule and instructions to f/u on day 3, 7 and 14. All questions and concerns addressed prior to d/c. Patient aware of symptoms to monitor for that would warrant return to ED for re-eval.   Final Clinical Impressions(s) / ED Diagnoses   Final diagnoses:  Exposure to bat without known bite    New Prescriptions Discharge Medication List as of 12/11/2016  8:11 PM     I personally performed the services described in this documentation, which was scribed in my presence. The recorded information has been reviewed and is accurate.     Kinnie Feil, PA-C 12/11/16 2112    Virgel Manifold, MD 12/11/16 [phone]

## 2016-12-11 NOTE — Discharge Instructions (Signed)
Given your recent exposure to a bat we started post exposure rabies prophylaxis.    The vaccine schedule for a person who has NOT been previously vaccinated is as follows: Day 0: Rabies immunoglobulin + Rabies vaccine (Rabavert) Day 3: Rabies vaccine (Rabavert) Day 7: Rabies vaccine (Rabavert) Day 14: Rabies vaccine (Rabavert)  Today was Day 0 and you received Rabies immunoglobulin + Rabies vaccine (Rabavert).  Please report to a medical facility for the remaining rabies vaccines. You can do this at any emergency department, urgent care and/or possibly primary care office.

## 2016-12-14 ENCOUNTER — Encounter (HOSPITAL_COMMUNITY): Payer: Self-pay | Admitting: Emergency Medicine

## 2016-12-14 ENCOUNTER — Ambulatory Visit (HOSPITAL_COMMUNITY)
Admission: EM | Admit: 2016-12-14 | Discharge: 2016-12-14 | Disposition: A | Discharge status: Home / Self Care | Payer: PPO | Attending: Internal Medicine | Admitting: Internal Medicine

## 2016-12-14 DIAGNOSIS — Z23 Encounter for immunization: Secondary | ICD-10-CM

## 2016-12-14 DIAGNOSIS — Z203 Contact with and (suspected) exposure to rabies: Secondary | ICD-10-CM | POA: Diagnosis not present

## 2016-12-14 MED ORDER — RABIES VACCINE, PCEC IM SUSR
INTRAMUSCULAR | Status: AC
  Filled 2016-12-14: qty 1

## 2016-12-14 MED ORDER — RABIES VACCINE, PCEC IM SUSR
1.0000 mL | Freq: Once | INTRAMUSCULAR | Status: AC
  Administered 2016-12-14: 1 mL via INTRAMUSCULAR

## 2016-12-14 NOTE — Discharge Instructions (Signed)
Return on 5/23 for 3rd rabies vaccination

## 2016-12-14 NOTE — ED Triage Notes (Signed)
Pt is here for 2nd rabies vaccination... Voices no new concerns... A&O x4... NAD... Ambulatory

## 2016-12-18 ENCOUNTER — Ambulatory Visit (HOSPITAL_COMMUNITY)
Admission: EM | Admit: 2016-12-18 | Discharge: 2016-12-18 | Disposition: A | Discharge status: Home / Self Care | Payer: PPO | Attending: Family Medicine | Admitting: Family Medicine

## 2016-12-18 ENCOUNTER — Encounter (HOSPITAL_COMMUNITY): Payer: Self-pay | Admitting: Emergency Medicine

## 2016-12-18 DIAGNOSIS — Z203 Contact with and (suspected) exposure to rabies: Secondary | ICD-10-CM | POA: Diagnosis not present

## 2016-12-18 DIAGNOSIS — Z23 Encounter for immunization: Secondary | ICD-10-CM | POA: Diagnosis not present

## 2016-12-18 MED ORDER — RABIES VACCINE, PCEC IM SUSR
1.0000 mL | Freq: Once | INTRAMUSCULAR | Status: AC
  Administered 2016-12-18: 1 mL via INTRAMUSCULAR

## 2016-12-18 MED ORDER — RABIES VACCINE, PCEC IM SUSR
INTRAMUSCULAR | Status: AC
  Filled 2016-12-18: qty 1

## 2016-12-18 NOTE — ED Triage Notes (Signed)
The patient presented to the [facility] to receive the Day 7 rabies booster injection.

## 2016-12-18 NOTE — Discharge Instructions (Signed)
Please return to the Urgent Care Center on May 30 ,2018 for your Day 14 rabies booster injection.

## 2016-12-25 ENCOUNTER — Encounter (HOSPITAL_COMMUNITY): Payer: Self-pay | Admitting: Family Medicine

## 2016-12-25 ENCOUNTER — Ambulatory Visit (HOSPITAL_COMMUNITY)
Admission: EM | Admit: 2016-12-25 | Discharge: 2016-12-25 | Disposition: A | Discharge status: Home / Self Care | Payer: PPO | Attending: Internal Medicine | Admitting: Internal Medicine

## 2016-12-25 DIAGNOSIS — Z23 Encounter for immunization: Secondary | ICD-10-CM

## 2016-12-25 DIAGNOSIS — Z203 Contact with and (suspected) exposure to rabies: Secondary | ICD-10-CM

## 2016-12-25 MED ORDER — RABIES VACCINE, PCEC IM SUSR
INTRAMUSCULAR | Status: AC
  Filled 2016-12-25: qty 1

## 2016-12-25 MED ORDER — RABIES VACCINE, PCEC IM SUSR
1.0000 mL | Freq: Once | INTRAMUSCULAR | Status: AC
  Administered 2016-12-25: 1 mL via INTRAMUSCULAR

## 2016-12-25 NOTE — ED Triage Notes (Signed)
Pt here for final rabies  °

## 2017-01-06 DIAGNOSIS — Z125 Encounter for screening for malignant neoplasm of prostate: Secondary | ICD-10-CM | POA: Diagnosis not present

## 2017-01-06 DIAGNOSIS — R7301 Impaired fasting glucose: Secondary | ICD-10-CM | POA: Diagnosis not present

## 2017-01-06 DIAGNOSIS — I10 Essential (primary) hypertension: Secondary | ICD-10-CM | POA: Diagnosis not present

## 2017-01-13 DIAGNOSIS — M79643 Pain in unspecified hand: Secondary | ICD-10-CM | POA: Diagnosis not present

## 2017-01-13 DIAGNOSIS — M1712 Unilateral primary osteoarthritis, left knee: Secondary | ICD-10-CM | POA: Diagnosis not present

## 2017-01-13 DIAGNOSIS — Z6829 Body mass index (BMI) 29.0-29.9, adult: Secondary | ICD-10-CM | POA: Diagnosis not present

## 2017-01-13 DIAGNOSIS — Z0000 Encounter for general adult medical examination without abnormal findings: Secondary | ICD-10-CM | POA: Diagnosis not present

## 2017-01-13 DIAGNOSIS — R7301 Impaired fasting glucose: Secondary | ICD-10-CM | POA: Diagnosis not present

## 2017-01-13 DIAGNOSIS — Z1389 Encounter for screening for other disorder: Secondary | ICD-10-CM | POA: Diagnosis not present

## 2017-01-13 DIAGNOSIS — R42 Dizziness and giddiness: Secondary | ICD-10-CM | POA: Diagnosis not present

## 2017-01-13 DIAGNOSIS — I4891 Unspecified atrial fibrillation: Secondary | ICD-10-CM | POA: Diagnosis not present

## 2017-01-13 DIAGNOSIS — E784 Other hyperlipidemia: Secondary | ICD-10-CM | POA: Diagnosis not present

## 2017-01-13 DIAGNOSIS — R05 Cough: Secondary | ICD-10-CM | POA: Diagnosis not present

## 2017-01-13 DIAGNOSIS — I809 Phlebitis and thrombophlebitis of unspecified site: Secondary | ICD-10-CM | POA: Diagnosis not present

## 2017-01-13 DIAGNOSIS — Z125 Encounter for screening for malignant neoplasm of prostate: Secondary | ICD-10-CM | POA: Diagnosis not present

## 2017-03-10 ENCOUNTER — Encounter: Payer: Self-pay | Admitting: Physician Assistant

## 2017-03-10 DIAGNOSIS — Z6830 Body mass index (BMI) 30.0-30.9, adult: Secondary | ICD-10-CM | POA: Diagnosis not present

## 2017-03-10 DIAGNOSIS — J028 Acute pharyngitis due to other specified organisms: Secondary | ICD-10-CM | POA: Diagnosis not present

## 2017-03-10 DIAGNOSIS — J069 Acute upper respiratory infection, unspecified: Secondary | ICD-10-CM | POA: Diagnosis not present

## 2017-05-15 DIAGNOSIS — Z23 Encounter for immunization: Secondary | ICD-10-CM | POA: Diagnosis not present

## 2017-09-17 DIAGNOSIS — H524 Presbyopia: Secondary | ICD-10-CM | POA: Diagnosis not present

## 2017-09-17 DIAGNOSIS — H5213 Myopia, bilateral: Secondary | ICD-10-CM | POA: Diagnosis not present

## 2017-09-17 DIAGNOSIS — H52203 Unspecified astigmatism, bilateral: Secondary | ICD-10-CM | POA: Diagnosis not present

## 2017-09-17 DIAGNOSIS — H2513 Age-related nuclear cataract, bilateral: Secondary | ICD-10-CM | POA: Diagnosis not present

## 2018-01-07 DIAGNOSIS — R42 Dizziness and giddiness: Secondary | ICD-10-CM | POA: Diagnosis not present

## 2018-01-07 DIAGNOSIS — Z6829 Body mass index (BMI) 29.0-29.9, adult: Secondary | ICD-10-CM | POA: Diagnosis not present

## 2018-01-07 DIAGNOSIS — I4891 Unspecified atrial fibrillation: Secondary | ICD-10-CM | POA: Diagnosis not present

## 2018-01-07 DIAGNOSIS — I10 Essential (primary) hypertension: Secondary | ICD-10-CM | POA: Diagnosis not present

## 2018-02-11 ENCOUNTER — Encounter (HOSPITAL_COMMUNITY): Payer: Self-pay | Admitting: Emergency Medicine

## 2018-02-11 ENCOUNTER — Other Ambulatory Visit: Payer: Self-pay

## 2018-02-11 ENCOUNTER — Emergency Department (HOSPITAL_COMMUNITY)
Admission: EM | Admit: 2018-02-11 | Discharge: 2018-02-11 | Disposition: A | Discharge status: Home / Self Care | Payer: PPO | Attending: Emergency Medicine | Admitting: Emergency Medicine

## 2018-02-11 ENCOUNTER — Emergency Department (HOSPITAL_COMMUNITY): Payer: PPO

## 2018-02-11 DIAGNOSIS — I10 Essential (primary) hypertension: Secondary | ICD-10-CM | POA: Insufficient documentation

## 2018-02-11 DIAGNOSIS — R531 Weakness: Secondary | ICD-10-CM | POA: Diagnosis not present

## 2018-02-11 DIAGNOSIS — Z87891 Personal history of nicotine dependence: Secondary | ICD-10-CM | POA: Insufficient documentation

## 2018-02-11 DIAGNOSIS — R55 Syncope and collapse: Secondary | ICD-10-CM | POA: Diagnosis not present

## 2018-02-11 DIAGNOSIS — Z7982 Long term (current) use of aspirin: Secondary | ICD-10-CM | POA: Insufficient documentation

## 2018-02-11 DIAGNOSIS — Z79899 Other long term (current) drug therapy: Secondary | ICD-10-CM | POA: Insufficient documentation

## 2018-02-11 LAB — BASIC METABOLIC PANEL
Anion gap: 7 (ref 5–15)
BUN: 17 mg/dL (ref 8–23)
CHLORIDE: 104 mmol/L (ref 98–111)
CO2: 28 mmol/L (ref 22–32)
Calcium: 9.1 mg/dL (ref 8.9–10.3)
Creatinine, Ser: 0.97 mg/dL (ref 0.61–1.24)
GFR calc Af Amer: >60 mL/min (ref >60)
GFR calc non Af Amer: >60 mL/min (ref >60)
Glucose, Bld: 103 mg/dL — ABNORMAL HIGH (ref 70–99)
POTASSIUM: 3.3 mmol/L — AB (ref 3.5–5.1)
Sodium: 139 mmol/L (ref 135–145)

## 2018-02-11 LAB — URINALYSIS, ROUTINE W REFLEX MICROSCOPIC
BACTERIA UA: NONE SEEN
BILIRUBIN URINE: NEGATIVE
Glucose, UA: NEGATIVE mg/dL
Ketones, ur: NEGATIVE mg/dL
LEUKOCYTES UA: NEGATIVE
Nitrite: NEGATIVE
Protein, ur: NEGATIVE mg/dL
SPECIFIC GRAVITY, URINE: 1.023 (ref 1.005–1.030)
pH: 5 (ref 5.0–8.0)

## 2018-02-11 LAB — CBC
HEMATOCRIT: 43.5 % (ref 39.0–52.0)
Hemoglobin: 14.6 g/dL (ref 13.0–17.0)
MCH: 31.7 pg (ref 26.0–34.0)
MCHC: 33.6 g/dL (ref 30.0–36.0)
MCV: 94.6 fL (ref 78.0–100.0)
Platelets: 176 10*3/uL (ref 150–400)
RBC: 4.6 MIL/uL (ref 4.22–5.81)
RDW: 11.9 % (ref 11.5–15.5)
WBC: 4.8 10*3/uL (ref 4.0–10.5)

## 2018-02-11 LAB — CBG MONITORING, ED: GLUCOSE-CAPILLARY: 114 mg/dL — AB (ref 70–99)

## 2018-02-11 MED ORDER — HYDROCODONE-ACETAMINOPHEN 7.5-325 MG/15ML PO SOLN: 10.0000 mL | Freq: Four times a day (QID) | ORAL | 0 refills | Status: DC | PRN

## 2018-02-11 MED ORDER — METOPROLOL TARTRATE 25 MG PO TABS
25.0000 mg | ORAL_TABLET | Freq: Once | ORAL | Status: AC
  Administered 2018-02-11: 25 mg via ORAL
  Filled 2018-02-11: qty 1

## 2018-02-11 MED ORDER — LISINOPRIL 20 MG PO TABS
20.0000 mg | ORAL_TABLET | Freq: Once | ORAL | Status: AC
  Administered 2018-02-11: 20 mg via ORAL
  Filled 2018-02-11: qty 1

## 2018-02-11 MED ORDER — HYDROCODONE-ACETAMINOPHEN 7.5-325 MG/15ML PO SOLN
10.0000 mL | Freq: Once | ORAL | Status: AC
  Administered 2018-02-11: 10 mL via ORAL
  Filled 2018-02-11: qty 15

## 2018-02-11 NOTE — ED Provider Notes (Signed)
[facility] EMERGENCY DEPARTMENT Provider Note   CSN: [id number] Arrival date & time: 02/11/18  1039     History   Chief Complaint Chief Complaint  Patient presents with  . Near Syncope    HPI CAMILLO QUADROS is a 67 y.o. male.  The history is provided by the patient. No language interpreter was used.    67 year old male with history of anxiety disorder, atrial fibrillation not on any anticoagulant, recurrent dizziness brought here via EMS from the dental office for near syncope.  Patient had prior root canal involving his right lower tooth several months ago.  On a routine dental checkup x-ray demonstrate some abnormalities in the tooth and today he had a procedure to clean out his tooth with the dentist.  He was injected with local anesthetic of lidocaine with epinephrine.  With the procedures is almost through, patient developed sensation of lightheadedness and feeling as if he was going to passed out.  Blood pressure at that time was elevated.  The procedure was discontinued and patient sent here for further evaluation.  While waiting in the hospital, patient felt better.  He denies any significant chest pain, shortness of breath, abdominal pain, focal numbness or weakness or headache.  Now he is endorse pain to the dental region from procedure but pain is manageable.  He did not take his blood pressure medication this morning.  He has history of paroxysmal A. fib on aspirin.  He has cardiac stress test 4 years ago.  He is scheduled to be seen by PCP for a follow-up soon.  Patient did complaining of generalized fatigue for the past 3 to 4 weeks usually with exertion.  No associated pain.  He is not a smoker, no significant family history of cardiac disease, no history of diabetes.  Past Medical History:  Diagnosis Date  . A-fib ([place]) 09/20/2013  . Anxiety disorder 12/19/2009  . Arthritis of knee, degenerative 12/19/2009  . BP (high blood pressure) 12/19/2009  . Clinical  depression 12/19/2009  . Depression   . 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 Elevated fasting blood sugar 12/19/2009  . Fatigue 12/19/2009  . HLD (hyperlipidemia) 12/19/2009  . HTN (hypertension)     Patient Active Problem List   Diagnosis Date Noted  . Dyspnea 10/19/2013  . A-fib ([place]) 09/20/2013  . Screening for gout 09/20/2013  . Cough 08/09/2013  . Dizziness 10/16/2011  . 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 Influenza-like illness 08/16/2011  . Hand discomfort 11/16/2010  . Anxiety disorder 12/19/2009  . Blood in feces 12/19/2009  . Clinical depression 12/19/2009  . Encounter for long-term (current) use of other medications 12/19/2009  . Fatigue 12/19/2009  . Other and unspecified general psychiatric examination 12/19/2009  . HLD (hyperlipidemia) 12/19/2009  . BP (high blood pressure) 12/19/2009  . Elevated fasting blood sugar 12/19/2009  . Arthritis of knee, degenerative 12/19/2009    History reviewed. No pertinent surgical history.      Home Medications    Prior to Admission medications   Medication Sig Start Date End Date Taking? Authorizing Provider  aspirin 325 MG tablet Take 325 mg by mouth daily.    [provider]  calcium carbonate (TUMS) 500 MG chewable tablet 500 mg as needed.  12/19/09   [provider]  lisinopril-hydrochlorothiazide (PRINZIDE,ZESTORETIC) 20-25 MG per tablet Take 1 tablet by mouth daily. 11/23/12   [provider]  metoprolol tartrate (LOPRESSOR) 25 MG tablet Take 25 mg by mouth 2 (two) times daily.    [provider]  sertraline (ZOLOFT) 100 MG tablet 100 mg daily. 08/08/10   [provider]    Family History Family History  Problem Relation Age of Onset  . Kidney Stones Mother   . Hypertension Father   . Other Father        kidney problems    Social History Social History    Tobacco Use  . Smoking status: Former Research scientist (life sciences)  . Smokeless tobacco: Never Used  . Tobacco comment: Quit about 3-4 months ago the occasional cigar  Substance Use Topics  . Alcohol use: No  . Drug use: No     Allergies   Celebrex [celecoxib]   Review of Systems Review of Systems  All other systems reviewed and are negative.    Physical Exam Updated Vital Signs BP (!) 166/102 (BP Location: Left Arm)   Pulse 77   Temp 98.8 F (37.1 C) (Oral)   Resp 18   SpO2 99%   Physical Exam  Constitutional: He is oriented to person, place, and time. He appears well-developed and well-nourished. No distress.  HENT:  Head: Atraumatic.  Mouth: Recent surgical manipulation to the right lower gumline with sutures in place.  Eyes: Conjunctivae are normal.  Neck: Normal range of motion. Neck supple. No JVD present.  Cardiovascular: Normal rate and regular rhythm.  Pulmonary/Chest: Effort normal and breath sounds normal.  Abdominal: Soft. Bowel sounds are normal. He exhibits no distension. There is no tenderness.  Musculoskeletal:  5 out of 5 strength all 4 extremities  Neurological: He is alert and oriented to person, place, and time. No cranial nerve deficit or sensory deficit. GCS eye subscore is 4. GCS verbal subscore is 5. GCS motor subscore is 6.  Skin: No rash noted.  Psychiatric: He has a normal mood and affect.  Nursing note and vitals reviewed.    ED Treatments / Results  Labs (all labs ordered are listed, but only abnormal results are displayed) Labs Reviewed  BASIC METABOLIC PANEL - Abnormal; Notable for the following components:      Result Value   Potassium 3.3 (*)    Glucose, Bld 103 (*)    All other components within normal limits  URINALYSIS, ROUTINE W REFLEX MICROSCOPIC - Abnormal; Notable for the following components:   Hgb urine dipstick SMALL (*)    All other components within normal limits  CBG MONITORING, ED - Abnormal; Notable for the following  components:   Glucose-Capillary 114 (*)    All other components within normal limits  CBC    EKG EKG Interpretation  Date/Time:  Wednesday February 11 2018 10:45:47 EDT Ventricular Rate:  71 PR Interval:  188 QRS Duration: 98 QT Interval:  432 QTC Calculation: 469 R Axis:   19 Text Interpretation:  Normal sinus rhythm Normal ECG No significant change since last tracing Confirmed by Deno Etienne [phone]) on 02/11/2018 1:04:46 PM    Date: 02/11/2018  Rate: 71  Rhythm: normal sinus rhythm  QRS Axis: normal  Intervals: normal  ST/T Wave abnormalities: normal  Conduction Disutrbances: none  Narrative Interpretation:   Old EKG Reviewed: No significant changes noted     Radiology Ct Head Wo Contrast  Result Date: 02/11/2018 CLINICAL DATA:  Syncopal episode during dental procedure. Weakness and fatigue for 1 month. EXAM: CT HEAD WITHOUT CONTRAST TECHNIQUE: Contiguous axial images were obtained from the base of the skull through the  vertex without intravenous contrast. COMPARISON:  None. FINDINGS: Brain: There is no evidence of acute intracranial hemorrhage, mass lesion, brain edema or extra-axial fluid collection. The ventricles and subarachnoid spaces are appropriately sized for age. There is no CT evidence of acute cortical infarction. Vascular: Intracranial vascular calcifications. No hyperdense vessel identified. Skull: Negative for fracture or focal lesion. Sinuses/Orbits: The visualized paranasal sinuses and mastoid air cells are clear. No orbital abnormalities are seen. Other: None. IMPRESSION: Negative noncontrast head CT. Electronically Signed   By: Richardean Sale M.D.   On: 02/11/2018 15:17    Procedures Procedures (including critical care time)  Medications Ordered in ED Medications  HYDROcodone-acetaminophen (HYCET) 7.5-325 mg/15 ml solution 10 mL (10 mLs Oral Given 02/11/18 1405)  lisinopril (PRINIVIL,ZESTRIL) tablet 20 mg (20 mg Oral Given 02/11/18 1510)  metoprolol tartrate  (LOPRESSOR) tablet 25 mg (25 mg Oral Given 02/11/18 1510)     Initial Impression / Assessment and Plan / ED Course  I have reviewed the triage vital signs and the nursing notes.  Pertinent labs & imaging results that were available during my care of the patient were reviewed by me and considered in my medical decision making (see chart for details).     BP (!) 166/102 (BP Location: Left Arm)   Pulse 77   Temp 98.8 F (37.1 C) (Oral)   Resp 18   SpO2 99%    Final Clinical Impressions(s) / ED Diagnoses   Final diagnoses:  Near syncope    ED Discharge Orders        Ordered    HYDROcodone-acetaminophen (HYCET) 7.5-325 mg/15 ml solution  Every 6 hours PRN     02/11/18 1529     1:03 PM Patient here for near syncope while he was undergoing a root canal cleansing at the dentist office.  He felt better at this time.  History of paroxysmal A. fib however EKG shows normal sinus rhythm without any concerning changes.  Labs are reassuring, normal CBG, mild hypokalemia with a potassium of 3.3, normal WBC, hemoglobin is 14.6.   3:30 PM Head CT scan unremarkable.  Patient received blood pressure medication as well as pain medication and his blood pressure improved to 149/103.  Patient felt much better.  He is stable for discharge.  He will follow-up closely with his primary care provider for further care.  Short course of pain medication prescribed. In order to decrease risk of narcotic abuse. Pt's record were checked using the [place] Controlled Substance database.     Domenic Moras, PA-C 02/11/18 [place], [place], DO 02/11/18 1620

## 2018-02-11 NOTE — ED Notes (Signed)
Patient Alert and oriented to baseline. Stable and ambulatory to baseline. Patient verbalized understanding of the discharge instructions.  Patient belongings were taken by the patient.   

## 2018-02-11 NOTE — ED Notes (Signed)
  States he was in the oral surgery office getting a root canal and during the procedure he started feeling bad his blood pressure elevated.  C/o headache.

## 2018-02-11 NOTE — ED Triage Notes (Addendum)
Pt arrives via [facility], ems reports pt was at his dental office having a procedure done (w/local anesthetic of lido w/epi) when he began feeling like he was going to pass out, dental staff reports pt was hypertensive, pt states he has not had his bp meds this am. C/o general fatigue/weakness for the past month, esp after doing physical activity. cbg 83, bp 170/98, HR 60, o2 98%. Pt a/ox4, speech clear, no neuro deficits noted.

## 2018-02-16 ENCOUNTER — Encounter: Payer: Self-pay | Admitting: Physician Assistant

## 2018-02-16 DIAGNOSIS — Z125 Encounter for screening for malignant neoplasm of prostate: Secondary | ICD-10-CM | POA: Diagnosis not present

## 2018-02-16 DIAGNOSIS — F418 Other specified anxiety disorders: Secondary | ICD-10-CM | POA: Diagnosis not present

## 2018-02-16 DIAGNOSIS — R7301 Impaired fasting glucose: Secondary | ICD-10-CM | POA: Diagnosis not present

## 2018-02-16 DIAGNOSIS — N528 Other male erectile dysfunction: Secondary | ICD-10-CM | POA: Diagnosis not present

## 2018-02-16 DIAGNOSIS — Z0000 Encounter for general adult medical examination without abnormal findings: Secondary | ICD-10-CM | POA: Diagnosis not present

## 2018-02-16 DIAGNOSIS — I10 Essential (primary) hypertension: Secondary | ICD-10-CM | POA: Diagnosis not present

## 2018-02-16 DIAGNOSIS — R05 Cough: Secondary | ICD-10-CM | POA: Diagnosis not present

## 2018-02-16 DIAGNOSIS — M79642 Pain in left hand: Secondary | ICD-10-CM | POA: Diagnosis not present

## 2018-02-16 DIAGNOSIS — M25562 Pain in left knee: Secondary | ICD-10-CM | POA: Diagnosis not present

## 2018-02-16 DIAGNOSIS — Z1389 Encounter for screening for other disorder: Secondary | ICD-10-CM | POA: Diagnosis not present

## 2018-02-16 DIAGNOSIS — M79641 Pain in right hand: Secondary | ICD-10-CM | POA: Diagnosis not present

## 2018-02-16 DIAGNOSIS — Z6829 Body mass index (BMI) 29.0-29.9, adult: Secondary | ICD-10-CM | POA: Diagnosis not present

## 2018-02-16 DIAGNOSIS — I4891 Unspecified atrial fibrillation: Secondary | ICD-10-CM | POA: Diagnosis not present

## 2018-02-16 DIAGNOSIS — E7849 Other hyperlipidemia: Secondary | ICD-10-CM | POA: Diagnosis not present

## 2018-02-16 DIAGNOSIS — R82998 Other abnormal findings in urine: Secondary | ICD-10-CM | POA: Diagnosis not present

## 2018-03-04 DIAGNOSIS — I10 Essential (primary) hypertension: Secondary | ICD-10-CM | POA: Diagnosis not present

## 2018-03-04 DIAGNOSIS — Z6830 Body mass index (BMI) 30.0-30.9, adult: Secondary | ICD-10-CM | POA: Diagnosis not present

## 2018-03-04 DIAGNOSIS — I4891 Unspecified atrial fibrillation: Secondary | ICD-10-CM | POA: Diagnosis not present

## 2018-03-06 ENCOUNTER — Encounter: Payer: Self-pay | Admitting: Physician Assistant

## 2018-03-06 ENCOUNTER — Ambulatory Visit: Payer: PPO | Admitting: Physician Assistant

## 2018-03-06 VITALS — BP 130/90 | HR 61 | Ht 68.0 in | Wt 188.0 lb

## 2018-03-06 DIAGNOSIS — I351 Nonrheumatic aortic (valve) insufficiency: Secondary | ICD-10-CM

## 2018-03-06 DIAGNOSIS — I10 Essential (primary) hypertension: Secondary | ICD-10-CM | POA: Diagnosis not present

## 2018-03-06 DIAGNOSIS — I480 Paroxysmal atrial fibrillation: Secondary | ICD-10-CM | POA: Diagnosis not present

## 2018-03-06 DIAGNOSIS — E785 Hyperlipidemia, unspecified: Secondary | ICD-10-CM | POA: Diagnosis not present

## 2018-03-06 DIAGNOSIS — R0600 Dyspnea, unspecified: Secondary | ICD-10-CM | POA: Diagnosis not present

## 2018-03-06 NOTE — Progress Notes (Signed)
Cardiology Office Note    Date:  03/06/2018   ID:  Juan Montes, DOB [birth year], MRN [id number]  PCP:  Crist Infante, MD  Cardiologist:  Case discussed with Dr. Stanford Breed (Plan to reestablish with Dr. Johnsie Cancel)   Chief Complaint  Patient presents with  . New Patient (Initial Visit)    here to reestablish with Dr. Johnsie Cancel, referred by Dr. Joylene Draft. Case discussed with DOD Dr. Stanford Breed    History of Present Illness:  Juan Montes is a 67 y.o. male with PMH of HTN, HLD and PAF.  Patient has been previously followed by Dr. Johnsie Cancel.  He was initially placed on aspirin and metoprolol for atrial fibrillation due to CHA2DS2-Vasc score of 1 remotely (now 2 since age 47 and HTN).  Later he was switched to Eliquis as echocardiogram obtained on 10/08/2013 showed possible clot in the LV apex, EF was 50 to 55%, mild AI.  Cardiac MRI obtained on 10/20/2013 was negative for apical thrombus, Eliquis was stopped.  ETT obtained on 11/03/2013 showed minor upsloping ST depression in inferolateral leads, however no significant ST changes to suggest ischemia.  He was last seen by Dr. Johnsie Cancel on 01/19/2014, he has been lost to follow-up since.  More recently, patient was seen in the ED on 02/11/2018 for near syncope.  This happened while the patient was getting a root canal and appears to be vasovagal according to ED physician.  Due to concern of right-sided headache, CT of the head was obtained which was negative for intracranial hemorrhage.  Patient presents today to reestablish with cardiology service.  He denies any recent chest pain.  He says he was not feeling very well for the past several month however did not know was causing the issue.  During the ED visit, he realized his blood pressure was very high.  Since then his metoprolol has been doubled and he was started on a high-dose amlodipine as well.  His blood pressure now is very well controlled and he is feeling much better.  He denies any obvious exertional chest pain,  however does admit to have occasional exertional dyspnea.  I recommended a echocardiogram as initial evaluation.  Although he had aortic regurgitation on the previous echocardiogram, I do not hear any significant heart murmur on physical exam.  Otherwise, even though his CHADS VASC score is now 2 given age greater than 99, however without obvious evidence of recurrent atrial fibrillation, I am hesitant to subject the patient to a lifelong of systemic anticoagulation.  We will continue observation for now and consider switching him to Eliquis if he does have recurrence of atrial fibrillation.  Of note, with the previous episode of atrial fibrillation 4 years ago, he did not have any obvious palpitation, he is presenting symptom at the time was fatigued and dyspnea on exertion.   Past Medical History:  Diagnosis Date  . A-fib ([place]) 09/20/2013  . Anxiety disorder 12/19/2009  . Arthritis of knee, degenerative 12/19/2009  . BP (high blood pressure) 12/19/2009  . Clinical depression 12/19/2009  . Depression   . 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 Elevated fasting blood sugar 12/19/2009  . Fatigue 12/19/2009  . HLD (hyperlipidemia) 12/19/2009  . HTN (hypertension)     History reviewed. No pertinent surgical history.  Current Medications: Outpatient Medications Prior to Visit  Medication Sig Dispense Refill  . amLODipine (NORVASC) 10 MG tablet Take 1 tablet by mouth 2 (two) times daily.    Marland Kitchen  aspirin 325 MG tablet Take 325 mg by mouth daily.    . calcium carbonate (TUMS) 500 MG chewable tablet 500 mg as needed.     Marland Kitchen HYDROcodone-acetaminophen (HYCET) 7.5-325 mg/15 ml solution Take 10 mLs by mouth every 6 (six) hours as needed for moderate pain or severe pain. 150 mL 0  . lisinopril-hydrochlorothiazide (PRINZIDE,ZESTORETIC) 20-25 MG per tablet Take 1 tablet by mouth daily.    . metoprolol tartrate (LOPRESSOR) 50 MG tablet Take 1 tablet by mouth 2 (two)  times daily.  3  . sertraline (ZOLOFT) 100 MG tablet 100 mg daily.    . metoprolol tartrate (LOPRESSOR) 25 MG tablet Take 25 mg by mouth 2 (two) times daily.     No facility-administered medications prior to visit.      Allergies:   Celebrex [celecoxib]   Social History   Socioeconomic History  . Marital status: Married    Spouse name: Not on file  . Number of children: Not on file  . Years of education: Not on file  . Highest education level: Not on file  Occupational History  . Not on file  Social Needs  . Financial resource strain: Not on file  . Food insecurity:    Worry: Not on file    Inability: Not on file  . Transportation needs:    Medical: Not on file    Non-medical: Not on file  Tobacco Use  . Smoking status: Former Research scientist (life sciences)  . Smokeless tobacco: Never Used  . Tobacco comment: Quit about 3-4 months ago the occasional cigar  Substance and Sexual Activity  . Alcohol use: No  . Drug use: No  . Sexual activity: Not on file  Lifestyle  . Physical activity:    Days per week: Not on file    Minutes per session: Not on file  . Stress: Not on file  Relationships  . Social connections:    Talks on phone: Not on file    Gets together: Not on file    Attends religious service: Not on file    Active member of club or organization: Not on file    Attends meetings of clubs or organizations: Not on file    Relationship status: Not on file  Other Topics Concern  . Not on file  Social History Narrative  . Not on file     Family History:  [name] includes Heart attack in his brother; Hypertension in his brother and father; Kidney Stones in his mother; Other in his father.   ROS:   Please see the history of present illness.    ROS All other systems reviewed and are negative.   PHYSICAL EXAM:   VS:  BP 130/90   Pulse 61   Ht 5\' 8"  (1.727 m)   Wt 188 lb (85.3 kg)   SpO2 97%   BMI 28.59 kg/m    GEN: Well nourished, well developed, in no  acute distress  HEENT: normal  Neck: no JVD, carotid bruits, or masses Cardiac: RRR; no murmurs, rubs, or gallops,no edema  Respiratory:  clear to auscultation bilaterally, normal work of breathing GI: soft, nontender, nondistended, + BS MS: no deformity or atrophy  Skin: warm and dry, no rash Neuro:  Alert and Oriented x 3, Strength and sensation are intact Psych: euthymic mood, full affect  Wt Readings from Last 3 Encounters:  03/06/18 188 lb (85.3 kg)  12/11/16 185 lb (83.9 kg)  01/19/14 190 lb 1.9 oz (86.2 kg)  Studies/Labs Reviewed:   EKG:  EKG is not ordered today.    Recent Labs: 02/11/2018: BUN 17; Creatinine, Ser 0.97; Hemoglobin 14.6; Platelets 176; Potassium 3.3; Sodium 139   Lipid Panel No results found for: CHOL, TRIG, HDL, CHOLHDL, VLDL, LDLCALC, LDLDIRECT  Additional studies/ records that were reviewed today include:   Echo 10/08/2013 LV EF: 50% -  55% Study Conclusions  - Left ventricle: The left ventricular apex is trabeculated, a thrombus cannot be excluded. An echo study with contrast is recommended. The cavity size was normal. There was mild concentric hypertrophy. Systolic function was normal. The estimated ejection fraction was in the range of 50% to 55%. Campoli motion was normal; there were no regional Carreno motion abnormalities. Doppler parameters are consistent with abnormal left ventricular relaxation (grade 1 diastolic dysfunction). There was no evidence of elevated ventricular filling pressure by Doppler parameters. - Aortic valve: Trileaflet; normal thickness leaflets. Sclerosis without stenosis. Mild regurgitation. - Aortic root: The aortic root was normal in size. - Mitral valve: Structurally normal valve. No regurgitation. - Left atrium: The atrium was moderately dilated. - Right ventricle: The cavity size was normal. Nery thickness was normal. Systolic function was normal. - Tricuspid valve: Trivial  regurgitation. - Pulmonic valve: Trivial regurgitation. - Pulmonary arteries: Systolic pressure was within the normal range. - Inferior vena cava: The vessel was normal in size. - Pericardium, extracardiac: There is large leftpleural effusion with adhesions. There was no pericardial effusion.    Myoview 10/20/2013 IMPRESSION: 1) No mural apical thrombus No LAA thrombus  2) Mild LVE with no RWMAls EF 54%  3) No delayed enhancement infiltration or scar  4) Ventricular noncompaction with persevered EF  5) Mild LAE  6) Mild MR  7) Normal RA/RV    ETT 11/03/2013 ETT Interpretation:  normal - no evidence of ischemia by ST analysis  Comments: Excellent exercise tolerance.  Minor upsloping ST depression (<0.60mm) inf-lat but no significant ST changes suggestive of ischemia. No ST elevation. No chest pain. Mild dyspnea towards end of test but still able to conversate. Hypertensive response to exercise at very end. Rare PVC. Patient was asymptomatic in recovery.  Recommendations: No significant changes or symptoms to suggest ischemia. Followup with Dr. Johnsie Cancel as planned.    ASSESSMENT:    1. Dyspnea, unspecified type   2. Aortic valve insufficiency, etiology of cardiac valve disease unspecified   3. Paroxysmal atrial fibrillation (HCC)   4. Essential hypertension   5. Hyperlipidemia, unspecified hyperlipidemia type      PLAN:  In order of problems listed above:  1. Dyspnea: Only intermittent, patient does not seems to be very concerned about this and attributed to his age.  He does not have any exertional chest pain.  I will obtain a repeat echocardiogram as initial assessment.  He has a history of very mild aortic regurgitation.  I do not see the referral note, will request record from primary care provider.  2. Aortic regurgitation: Seen on previous echocardiogram, however I do not appreciate a significant heart murmur on today's physical  exam.  3. Paroxysmal atrial fibrillation: He does not seem to have clear cardiac awareness of atrial fibrillation, he does not experience palpitation, his previous symptom associated with atrial fibrillation was more dyspnea and fatigue.  Although he has been feeling bad for several months, however after his blood pressure medication was titrated recently, his symptom has significantly improved. His CHA2DS2-Vasc score is now 2 (age and HTN), however since he has not had any obvious  recurrence of atrial fibrillation in the last 4 years, I am hesitant to subject him to chronic systemic anticoagulation therapy   4. Hypertension: Blood pressure was high during the recent ED visit, since then, his blood pressure medication has been uptitrated.  His beta-blocker was doubled and a amlodipine 10 mg was added.  5. Hyperlipidemia: Annual lipid panel followed by primary care provider.    Medication Adjustments/Labs and Tests Ordered: Current medicines are reviewed at length with the patient today.  Concerns regarding medicines are outlined above.  Medication changes, Labs and Tests ordered today are listed in the Patient Instructions below. Patient Instructions  Medication Instructions:  NO CHANGES- Your physician recommends that you continue on your current medications as directed. Please refer to the Current Medication list given to you today.  If you need a refill on your cardiac medications before your next appointment, please call your pharmacy.  Testing/Procedures: Echocardiogram - Your physician has requested that you have an echocardiogram. Echocardiography is a painless test that uses sound waves to create images of your heart. It provides your doctor with information about the size and shape of your heart and how well your heart's chambers and valves are working. This procedure takes approximately one hour. There are no restrictions for this procedure. This will be performed at our [facility]  location - [address].  Follow-Up: Your physician wants you to follow-up in: 3-4 months with DR Johnsie Cancel.    Thank you for choosing [facility] at [employer], [place]  03/06/2018 11:05 AM    [address] [place], [place], [place]  [address] Phone: [phone]; Fax: [phone]

## 2018-03-06 NOTE — Patient Instructions (Signed)
Medication Instructions:  NO CHANGES- Your physician recommends that you continue on your current medications as directed. Please refer to the Current Medication list given to you today.  If you need a refill on your cardiac medications before your next appointment, please call your pharmacy.  Testing/Procedures: Echocardiogram - Your physician has requested that you have an echocardiogram. Echocardiography is a painless test that uses sound waves to create images of your heart. It provides your doctor with information about the size and shape of your heart and how well your heart's chambers and valves are working. This procedure takes approximately one hour. There are no restrictions for this procedure. This will be performed at our [facility] location - [address].  Follow-Up: Your physician wants you to follow-up in: 3-4 months with DR Johnsie Cancel.    Thank you for choosing [facility] at [facility]!!

## 2018-03-16 ENCOUNTER — Ambulatory Visit (HOSPITAL_COMMUNITY): Payer: PPO | Attending: Cardiology

## 2018-03-16 ENCOUNTER — Other Ambulatory Visit: Payer: Self-pay

## 2018-03-16 DIAGNOSIS — I480 Paroxysmal atrial fibrillation: Secondary | ICD-10-CM | POA: Insufficient documentation

## 2018-03-16 DIAGNOSIS — I351 Nonrheumatic aortic (valve) insufficiency: Secondary | ICD-10-CM | POA: Diagnosis not present

## 2018-03-16 DIAGNOSIS — Z87891 Personal history of nicotine dependence: Secondary | ICD-10-CM | POA: Diagnosis not present

## 2018-03-16 DIAGNOSIS — I4891 Unspecified atrial fibrillation: Secondary | ICD-10-CM | POA: Insufficient documentation

## 2018-03-16 DIAGNOSIS — E785 Hyperlipidemia, unspecified: Secondary | ICD-10-CM | POA: Diagnosis not present

## 2018-03-16 DIAGNOSIS — R0600 Dyspnea, unspecified: Secondary | ICD-10-CM | POA: Diagnosis not present

## 2018-03-16 DIAGNOSIS — I10 Essential (primary) hypertension: Secondary | ICD-10-CM | POA: Diagnosis not present

## 2018-03-16 MED ORDER — PERFLUTREN LIPID MICROSPHERE
1.0000 mL | INTRAVENOUS | Status: AC | PRN
Start: 2018-03-16 — End: 2018-03-16
  Administered 2018-03-16: 1 mL via INTRAVENOUS

## 2018-03-18 ENCOUNTER — Other Ambulatory Visit: Payer: Self-pay

## 2018-03-18 DIAGNOSIS — I519 Heart disease, unspecified: Secondary | ICD-10-CM

## 2018-03-18 NOTE — Progress Notes (Signed)
LV dysfunction

## 2018-03-18 NOTE — Progress Notes (Signed)
Order placed message sent to schedulers to have test scheduled.

## 2018-03-19 ENCOUNTER — Encounter: Payer: Self-pay | Admitting: Physician Assistant

## 2018-03-20 ENCOUNTER — Telehealth: Payer: Self-pay

## 2018-03-20 NOTE — Telephone Encounter (Signed)
Spoke with patient to make sure he was aware that he did not need to hold any medication prior to testing per [facility]. Patient stated he would me mailed a letter with the stress test instructions. I offered to review instructions with patient but patient stated he will wait for the letter to come in the mail.

## 2018-03-20 NOTE — Telephone Encounter (Signed)
-----   Message from [place], [place] sent at 03/19/2018  5:12 PM EDT ----- Regarding: RE: dx for lexiscan Nope. Since it is a lexiscan stress test, he can take all BP meds including metoprolol with sips of water in AM of stress test  Signed, Almyra Deforest PA Pager: [phone]    ----- Message ----- From: Harold Hedge, CMA Sent: 03/18/2018   4:49 PM EDT To: Almyra Deforest, PA Subject: RE: dx for lexiscan                            Was there any medication he needed to hold?   ----- Message ----- From: Almyra Deforest, PA Sent: 03/18/2018   4:27 PM EDT To: Harold Hedge, CMA Subject: RE: dx for lexiscan                            Diagnosis is LV dysfunction.   Thanks  ----- Message ----- From: Harold Hedge, CMA Sent: 03/18/2018   3:50 PM EDT To: Almyra Deforest, PA Subject: dx for lexiscan                                Hey hao want dx should I use for the lexiscan for this patient   Thanks,  Tee

## 2018-03-27 ENCOUNTER — Telehealth (HOSPITAL_COMMUNITY): Payer: Self-pay

## 2018-03-27 NOTE — Telephone Encounter (Signed)
Encounter complete. 

## 2018-03-31 ENCOUNTER — Telehealth (HOSPITAL_COMMUNITY): Payer: Self-pay

## 2018-03-31 NOTE — Telephone Encounter (Signed)
Encounter complete. 

## 2018-04-01 ENCOUNTER — Ambulatory Visit (HOSPITAL_COMMUNITY)
Admission: RE | Admit: 2018-04-01 | Discharge: 2018-04-01 | Disposition: A | Discharge status: Home / Self Care | Payer: PPO | Source: Ambulatory Visit | Attending: Cardiovascular Disease | Admitting: Cardiovascular Disease

## 2018-04-01 DIAGNOSIS — I519 Heart disease, unspecified: Secondary | ICD-10-CM | POA: Insufficient documentation

## 2018-04-01 LAB — MYOCARDIAL PERFUSION IMAGING
CHL CUP NUCLEAR SRS: 0
CHL CUP NUCLEAR SSS: 0
LV dias vol: 170 mL (ref 62–150)
LVSYSVOL: 98 mL
Peak HR: 83 {beats}/min
Rest HR: 57 {beats}/min
SDS: 0
TID: 1.17

## 2018-04-01 MED ORDER — TECHNETIUM TC 99M TETROFOSMIN IV KIT
25.1000 | PACK | Freq: Once | INTRAVENOUS | Status: AC | PRN
  Administered 2018-04-01: 25.1 via INTRAVENOUS
  Filled 2018-04-01: qty 26

## 2018-04-01 MED ORDER — TECHNETIUM TC 99M TETROFOSMIN IV KIT
7.9000 | PACK | Freq: Once | INTRAVENOUS | Status: AC | PRN
  Administered 2018-04-01: 7.9 via INTRAVENOUS
  Filled 2018-04-01: qty 8

## 2018-04-01 MED ORDER — REGADENOSON 0.4 MG/5ML IV SOLN
0.4000 mg | Freq: Once | INTRAVENOUS | Status: AC
  Administered 2018-04-01: 0.4 mg via INTRAVENOUS

## 2018-04-03 ENCOUNTER — Other Ambulatory Visit: Payer: Self-pay | Admitting: Physician Assistant

## 2018-04-03 DIAGNOSIS — R9439 Abnormal result of other cardiovascular function study: Secondary | ICD-10-CM

## 2018-04-08 ENCOUNTER — Encounter: Payer: Self-pay | Admitting: *Deleted

## 2018-04-10 ENCOUNTER — Telehealth: Payer: Self-pay | Admitting: Physician Assistant

## 2018-04-10 NOTE — Telephone Encounter (Signed)
Call patient to discuss stress test results. Patient describes symptoms that are concerning, he gets weakness whenever he tries to exert himself.  Symptoms are consistent with exertion. Concern for possible anginal equivalent, patient is to come in 9/16 to discuss cath.  Rosaria Ferries, PA-C 04/10/2018 3:51 PM Beeper [phone]

## 2018-04-13 ENCOUNTER — Ambulatory Visit (INDEPENDENT_AMBULATORY_CARE_PROVIDER_SITE_OTHER): Payer: PPO | Admitting: Physician Assistant

## 2018-04-13 ENCOUNTER — Encounter: Payer: Self-pay | Admitting: Physician Assistant

## 2018-04-13 VITALS — BP 132/83 | HR 69 | Ht 69.0 in | Wt 189.0 lb

## 2018-04-13 DIAGNOSIS — I10 Essential (primary) hypertension: Secondary | ICD-10-CM | POA: Diagnosis not present

## 2018-04-13 DIAGNOSIS — R9439 Abnormal result of other cardiovascular function study: Secondary | ICD-10-CM | POA: Diagnosis not present

## 2018-04-13 DIAGNOSIS — I429 Cardiomyopathy, unspecified: Secondary | ICD-10-CM

## 2018-04-13 NOTE — H&P (View-Only) (Signed)
Cardiology Office Note   Date:  04/13/2018   ID:  KANNAN PROIA, DOB [birth year], MRN [id number]  PCP:  Crist Infante, MD  Cardiologist: Dr Johnsie Cancel 12/2013 Almyra Deforest, PA-C, 03/06/2018, pt reviewed w/ Dr Alcide Evener, PA-C   No chief complaint on file.   History of Present Illness: Juan Montes is a 67 y.o. male with a history of HTN, HLD, PAF, LV thrombus by echo 09/2013 on Eliquis>>resolved and Eliquis stopped, POET 2015 w/out ECG changes  ER visit 02/11/2018 for near syncope, possible vasovagal event, BP very high 03/06/2018 office visit, patient feeling better after beta-blocker doubled and amlodipine added, dyspnea on exertion noted and echo/stress test ordered, MV low risk fbut findings consistent with ischemia.  09/13 phone note regarding stress test results, appt made.   Juan Montes presents for cardiology follow up.  His BP has been high at times. It was 150/86 last week, this was late in the evening. He is compliant w/ meds, taking the 2nd metoprolol dose at bedtime.  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 This is emotional stress as well.   According to his wife, he is exhausted when he comes home. He does not do anything after he comes home, is too tired. He also has trouble getting going in the morning.   He coughs when he is under stress. He has a mild cough at times anyway, but it worsens when he is under stress.   His fatigue is much worse than it used to be. He used to have a lot more energy. He has to force himself to do things.   He does not ever get chest pain.   He has always been a Research officer, trade union. He has had chest pain in the remote past, at that time, it was felt to be from nerves.   His brother had a heart attack at the age of 42 and needed bypass surgery.  He is concerned that coronary artery disease could be causing some of his symptoms.   Past Medical History:  Diagnosis  Date  . A-fib ([place]) 09/20/2013  . Anxiety disorder 12/19/2009  . Arthritis of knee, degenerative 12/19/2009  . BP (high blood pressure) 12/19/2009  . Clinical depression 12/19/2009  . Depression   . 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 Elevated fasting blood sugar 12/19/2009  . Fatigue 12/19/2009  . HLD (hyperlipidemia) 12/19/2009  . HTN (hypertension)     History reviewed. No pertinent surgical history.  Current Outpatient Medications  Medication Sig Dispense Refill  . calcium carbonate (TUMS) 500 MG chewable tablet 500 mg as needed.     Marland Kitchen HYDROcodone-acetaminophen (HYCET) 7.5-325 mg/15 ml solution Take 10 mLs by mouth every 6 (six) hours as needed for moderate pain or severe pain. 150 mL 0  . lisinopril-hydrochlorothiazide (PRINZIDE,ZESTORETIC) 20-25 MG per tablet Take 1 tablet by mouth daily.    . metoprolol tartrate (LOPRESSOR) 50 MG tablet Take 1 tablet by mouth 2 (two) times daily.  3  . sertraline (ZOLOFT) 100 MG tablet 100 mg daily.     No current facility-administered medications for this visit.     Allergies:   Celebrex [celecoxib]    Social History:  The patient  reports that he has quit smoking. He has never used smokeless tobacco. He reports that he does not drink alcohol or use drugs.  Family History:  [name] includes Heart attack (age of onset: 66) in his brother; Hypertension in his brother and father; Kidney Stones in his mother.  The patient indicated that his mother is alive. He indicated that his father is alive. He indicated that his brother is alive.  ROS:  Please see the history of present illness. All other systems are reviewed and negative.    PHYSICAL EXAM: VS:  BP 132/83   Pulse 69   Ht 5\' 9"  (1.753 m)   Wt 189 lb (85.7 kg)   BMI 27.91 kg/m  , BMI Body mass index is 27.91 kg/m. GEN: Well nourished, well developed, male in no acute distress  HEENT: normal for age  Neck: no  JVD, no carotid bruit, no masses Cardiac: RRR; no murmur, no rubs, or gallops Respiratory:  clear to auscultation bilaterally, normal work of breathing GI: soft, nontender, nondistended, + BS MS: no deformity or atrophy; no edema; distal pulses are 2+ in all 4 extremities   Skin: warm and dry, no rash Neuro:  Strength and sensation are intact Psych: euthymic mood, full affect   EKG:  EKG is ordered today. The ekg ordered today demonstrates sinus rhythm, heart rate 61, no acute ischemic changes and no pathologic Q waves   Recent Labs: 02/11/2018: BUN 17; Creatinine, Ser 0.97; Hemoglobin 14.6; Platelets 176; Potassium 3.3; Sodium 139    Lipid Panel No results found for: CHOL, TRIG, HDL, CHOLHDL, VLDL, LDLCALC, LDLDIRECT   Wt Readings from Last 3 Encounters:  04/13/18 189 lb (85.7 kg)  04/01/18 188 lb (85.3 kg)  03/06/18 188 lb (85.3 kg)     Other studies Reviewed: The patient was reviewed and discussed with Dr. Debara Pickett, DOD, who agrees with the plan Additional studies/ records that were reviewed today include: Office notes, hospital records, and testing.  ASSESSMENT AND PLAN:  1.  Abnormal stress test: He is not having typical chest pain, but his fatigue and weakness are concerning for an anginal equivalent, in the setting of left ventricular dysfunction and an abnormal stress test. - Cardiac catheterization is indicated. - The risks and benefits of a cardiac catheterization including, but not limited to, death, stroke, MI, kidney damage and bleeding were discussed with the patient and his wife who indicate understanding and agree to proceed.  -The procedure has been scheduled for this Wednesday. -Hold the lisinopril HCTZ the day of the cath and make sure to take the metoprolol and the aspirin.  2.  Hypertension: His blood pressure is elevated at times, it seems to happen more often in the evening. -I requested he take the evening dose of metoprolol at suppertime instead of at  bedtime, this may help give him improved 24-hour control.  3.  Cardiomyopathy: Unclear if this is ischemic or nonischemic>> cath - He is on beta-blocker and an ACE inhibitor plus a diuretic. - Once catheterization results are known, decide on changing amlodipine 10 mg daily which she is tolerating well to hydralazine 25 mg 3 times daily and uptitrate as needed.   Current medicines are reviewed at length with the patient today.  The patient does not have concerns regarding medicines.  The following changes have been made: Change the timing of the evening dose of metoprolol  Labs/ tests ordered today include:   Orders Placed This Encounter  Procedures  . Basic metabolic panel  . CBC  . Protime-INR  . EKG 12-Lead     Disposition:   FU with Dr Johnsie Cancel in  November as scheduled  Jonetta Speak, PA-C  04/13/2018 12:30 PM    [facility] Phone: [phone]; Fax: [phone]  This note was written with the assistance of speech recognition software. Please excuse any transcriptional errors.

## 2018-04-13 NOTE — Patient Instructions (Addendum)
Medication Instructions:  No Changes. If you need a refill on your cardiac medications before your next appointment, please call your pharmacy.  Labwork: BMET, CBC, PT/INR HERE IN OUR OFFICE AT [facility]  Take the provided lab slips with you to the lab for your blood draw.   Testing/Procedures:  Your physician has requested that you have a cardiac catheterization. Cardiac catheterization is used to diagnose and/or treat various heart conditions. Doctors may recommend this procedure for a number of different reasons. The most common reason is to evaluate chest pain. Chest pain can be a symptom of coronary artery disease (CAD), and cardiac catheterization can show whether plaque is narrowing or blocking your heart's arteries. This procedure is also used to evaluate the valves, as well as measure the blood flow and oxygen levels in different parts of your heart. For further information please visit [email or web address]. Please follow instruction sheet, as given.    Follow-Up: Your physician wants you to keep follow up appointment with Dr.Nishan. Call if you need [place] sooner.    Thank you for choosing [facility] at [facility]!!            [place] [place] [facility] [place] [address] Dept: [phone] Loc: [phone]  Juan Montes  04/13/2018  You are scheduled for a Cardiac Catheterization on Wednesday, September 18 with Dr. Glenetta Hew.  1. Please arrive at the [facility] (Main Entrance A) at [facility]: [address] [place], [place] [address] at 8:00 AM (This time is two hours before your procedure to ensure your preparation). Free valet parking service is available.   Special note: Every effort is made to have your procedure done on time. Please understand that emergencies sometimes delay scheduled procedures.  2. Diet: Do not eat solid foods after midnight.  The patient  may have clear liquids until 5am upon the day of the procedure.  3. Labs: You will need to have blood drawn today, CBC, BMET, PT/INR  4. Medication instructions in preparation for your procedure:   Contrast Allergy: No  Stop taking, Lisinopril (Zestril or Prinivil) on Wednesday, September 18.  On the morning of your procedure, take your Aspirin and any morning medicines NOT listed above.  You may use sips of water.  5. Plan for one night stay--bring personal belongings. 6. Bring a current list of your medications and current insurance cards. 7. You MUST have a responsible person to drive you home. 8. Someone MUST be with you the first 24 hours after you arrive home or your discharge will be delayed. 9. Please wear clothes that are easy to get on and off and wear slip-on shoes.  Thank you for allowing [place] to care for you!   -- [facility]

## 2018-04-13 NOTE — Progress Notes (Addendum)
Cardiology Office Note   Date:  04/13/2018   ID:  Juan Montes, DOB 11-22-1950, MRN [id number]  PCP:  Crist Infante, MD  Cardiologist: Dr Johnsie Cancel 12/2013 Almyra Deforest, PA-C, 03/06/2018, pt reviewed w/ Dr Alcide Evener, PA-C   No chief complaint on file.   History of Present Illness: Juan Montes is a 67 y.o. male with a history of HTN, HLD, PAF, LV thrombus by echo 09/2013 on Eliquis>>resolved and Eliquis stopped, POET 2015 w/out ECG changes  ER visit 02/11/2018 for near syncope, possible vasovagal event, BP very high 03/06/2018 office visit, patient feeling better after beta-blocker doubled and amlodipine added, dyspnea on exertion noted and echo/stress test ordered, MV low risk fbut findings consistent with ischemia.  09/13 phone note regarding stress test results, appt made.   Juan Montes presents for cardiology follow up.  His BP has been high at times. It was 150/86 last week, this was late in the evening. He is compliant w/ meds, taking the 2nd metoprolol dose at bedtime.  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 He does a lot of the work. 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 This is emotional stress as well.   According to his wife, he is exhausted when he comes home. He does not do anything after he comes home, is too tired. He also has trouble getting going in the morning.   He coughs when he is under stress. He has a mild cough at times anyway, but it worsens when he is under stress.   His fatigue is much worse than it used to be. He used to have a lot more energy. He has to force himself to do things.   He does not ever get chest pain.   He has always been a Research officer, trade [employer]. He has had chest pain in the remote past, at that time, it was felt to be from nerves.   His brother had a heart attack at the age of 47 and needed bypass surgery.  He is concerned that coronary artery disease could be causing some of his symptoms.   Past Medical History:  Diagnosis  Date  . A-fib ([place]) 09/20/2013  . Anxiety disorder 12/19/2009  . Arthritis of knee, degenerative 12/19/2009  . BP (high blood pressure) 12/19/2009  . Clinical depression 12/19/2009  . Depression   . 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 Elevated fasting blood sugar 12/19/2009  . Fatigue 12/19/2009  . HLD (hyperlipidemia) 12/19/2009  . HTN (hypertension)     History reviewed. No pertinent surgical history.  Current Outpatient Medications  Medication Sig Dispense Refill  . calcium carbonate (TUMS) 500 MG chewable tablet 500 mg as needed.     Marland Kitchen HYDROcodone-acetaminophen (HYCET) 7.5-325 mg/15 ml solution Take 10 mLs by mouth every 6 (six) hours as needed for moderate pain or severe pain. 150 mL 0  . lisinopril-hydrochlorothiazide (PRINZIDE,ZESTORETIC) 20-25 MG per tablet Take 1 tablet by mouth daily.    . metoprolol tartrate (LOPRESSOR) 50 MG tablet Take 1 tablet by mouth 2 (two) times daily.  3  . sertraline (ZOLOFT) 100 MG tablet 100 mg daily.     No current facility-administered medications for this visit.     Allergies:   Celebrex [celecoxib]    Social History:  The patient  reports that he has quit smoking. He has never used smokeless tobacco. He reports that he does not drink alcohol or use drugs.  Family History:  [name] includes Heart attack (age of onset: 81) in his brother; Hypertension in his brother and father; Kidney Stones in his mother.  The patient indicated that his mother is alive. He indicated that his father is alive. He indicated that his brother is alive.  ROS:  Please see the history of present illness. All other systems are reviewed and negative.    PHYSICAL EXAM: VS:  BP 132/83   Pulse 69   Ht 5\' 9"  (1.753 m)   Wt 189 lb (85.7 kg)   BMI 27.91 kg/m  , BMI Body mass index is 27.91 kg/m. GEN: Well nourished, well developed, male in no acute distress  HEENT: normal for age  Neck: no  JVD, no carotid bruit, no masses Cardiac: RRR; no murmur, no rubs, or gallops Respiratory:  clear to auscultation bilaterally, normal work of breathing GI: soft, nontender, nondistended, + BS MS: no deformity or atrophy; no edema; distal pulses are 2+ in all 4 extremities   Skin: warm and dry, no rash Neuro:  Strength and sensation are intact Psych: euthymic mood, full affect   EKG:  EKG is ordered today. The ekg ordered today demonstrates sinus rhythm, heart rate 61, no acute ischemic changes and no pathologic Q waves   Recent Labs: 02/11/2018: BUN 17; Creatinine, Ser 0.97; Hemoglobin 14.6; Platelets 176; Potassium 3.3; Sodium 139    Lipid Panel No results found for: CHOL, TRIG, HDL, CHOLHDL, VLDL, LDLCALC, LDLDIRECT   Wt Readings from Last 3 Encounters:  04/13/18 189 lb (85.7 kg)  04/01/18 188 lb (85.3 kg)  03/06/18 188 lb (85.3 kg)     Other studies Reviewed: The patient was reviewed and discussed with Dr. Debara Pickett, DOD, who agrees with the plan Additional studies/ records that were reviewed today include: Office notes, hospital records, and testing.  ASSESSMENT AND PLAN:  1.  Abnormal stress test: He is not having typical chest pain, but his fatigue and weakness are concerning for an anginal equivalent, in the setting of left ventricular dysfunction and an abnormal stress test. - Cardiac catheterization is indicated. - The risks and benefits of a cardiac catheterization including, but not limited to, death, stroke, MI, kidney damage and bleeding were discussed with the patient and his wife who indicate understanding and agree to proceed.  -The procedure has been scheduled for this Wednesday. -Hold the lisinopril HCTZ the day of the cath and make sure to take the metoprolol and the aspirin.  2.  Hypertension: His blood pressure is elevated at times, it seems to happen more often in the evening. -I requested he take the evening dose of metoprolol at suppertime instead of at  bedtime, this may help give him improved 24-hour control.  3.  Cardiomyopathy: Unclear if this is ischemic or nonischemic>> cath - He is on beta-blocker and an ACE inhibitor plus a diuretic. - Once catheterization results are known, decide on changing amlodipine 10 mg daily which she is tolerating well to hydralazine 25 mg 3 times daily and uptitrate as needed.   Current medicines are reviewed at length with the patient today.  The patient does not have concerns regarding medicines.  The following changes have been made: Change the timing of the evening dose of metoprolol  Labs/ tests ordered today include:   Orders Placed This Encounter  Procedures  . Basic metabolic panel  . CBC  . Protime-INR  . EKG 12-Lead     Disposition:   FU with Dr Johnsie Cancel in  November as scheduled  Jonetta Speak, PA-C  04/13/2018 12:30 PM    [facility] Phone: [phone]; Fax: [phone]  This note was written with the assistance of speech recognition software. Please excuse any transcriptional errors.

## 2018-04-14 ENCOUNTER — Telehealth: Payer: Self-pay | Admitting: *Deleted

## 2018-04-14 LAB — PROTIME-INR
INR: 1 (ref 0.8–1.2)
Prothrombin Time: 10.3 s (ref 9.1–12.0)

## 2018-04-14 LAB — CBC
Hematocrit: 44.1 % (ref 37.5–51.0)
Hemoglobin: 14.6 g/dL (ref 13.0–17.7)
MCH: 31.5 pg (ref 26.6–33.0)
MCHC: 33.1 g/dL (ref 31.5–35.7)
MCV: 95 fL (ref 79–97)
Platelets: 224 10*3/uL (ref 150–450)
RBC: 4.64 x10E6/uL (ref 4.14–5.80)
RDW: 13.5 % (ref 12.3–15.4)
WBC: 5.7 10*3/uL (ref 3.4–10.8)

## 2018-04-14 LAB — BASIC METABOLIC PANEL
BUN/Creatinine Ratio: 19 (ref 10–24)
BUN: 18 mg/dL (ref 8–27)
CO2: 25 mmol/L (ref 20–29)
Calcium: 9.8 mg/dL (ref 8.6–10.2)
Chloride: 97 mmol/L (ref 96–106)
Creatinine, Ser: 0.93 mg/dL (ref 0.76–1.27)
GFR calc Af Amer: 99 mL/min/{1.73_m2} (ref >59)
GFR, EST NON AFRICAN AMERICAN: 85 mL/min/{1.73_m2} (ref >59)
Glucose: 79 mg/dL (ref 65–99)
Potassium: 4 mmol/L (ref 3.5–5.2)
SODIUM: 141 mmol/L (ref 134–144)

## 2018-04-14 NOTE — Telephone Encounter (Signed)
Pt contacted pre-catheterization scheduled at [facility] for: Wednesday April 15, 2018 10 AM Verified arrival time and place: [place] Entrance A at: 8 AM  No solid food after midnight prior to cath, clear liquids until 5 AM day of procedure. Verified allergies in Epic Verified no diabetes medications.  Hold:Marland Kitchen Lisinopril-HCTZ-AM of procedure.  Except hold medications AM meds can be  taken pre-cath with sip of water including: ASA 81 mg  Confirmed patient has responsible person to drive home post procedure and for 24 hours after you arrive home: yes

## 2018-04-15 ENCOUNTER — Encounter (HOSPITAL_COMMUNITY)
Admission: RE | Disposition: A | Discharge status: Home / Self Care | Payer: Self-pay | Source: Ambulatory Visit | Attending: Cardiology

## 2018-04-15 ENCOUNTER — Other Ambulatory Visit: Payer: Self-pay

## 2018-04-15 ENCOUNTER — Encounter (HOSPITAL_COMMUNITY): Payer: Self-pay | Admitting: Cardiology

## 2018-04-15 ENCOUNTER — Ambulatory Visit (HOSPITAL_COMMUNITY)
Admission: RE | Admit: 2018-04-15 | Discharge: 2018-04-15 | Disposition: A | Discharge status: Home / Self Care | Payer: PPO | Source: Ambulatory Visit | Attending: Cardiology | Admitting: Cardiology

## 2018-04-15 DIAGNOSIS — Z79899 Other long term (current) drug therapy: Secondary | ICD-10-CM | POA: Diagnosis not present

## 2018-04-15 DIAGNOSIS — I4891 Unspecified atrial fibrillation: Secondary | ICD-10-CM | POA: Insufficient documentation

## 2018-04-15 DIAGNOSIS — E785 Hyperlipidemia, unspecified: Secondary | ICD-10-CM | POA: Diagnosis not present

## 2018-04-15 DIAGNOSIS — Z886 Allergy status to analgesic agent status: Secondary | ICD-10-CM | POA: Diagnosis not present

## 2018-04-15 DIAGNOSIS — F329 Major depressive disorder, single episode, unspecified: Secondary | ICD-10-CM | POA: Diagnosis not present

## 2018-04-15 DIAGNOSIS — R9439 Abnormal result of other cardiovascular function study: Secondary | ICD-10-CM | POA: Diagnosis present

## 2018-04-15 DIAGNOSIS — I25118 Atherosclerotic heart disease of native coronary artery with other forms of angina pectoris: Secondary | ICD-10-CM | POA: Diagnosis not present

## 2018-04-15 DIAGNOSIS — I2089 Other forms of angina pectoris: Secondary | ICD-10-CM | POA: Clinically undetermined

## 2018-04-15 DIAGNOSIS — Z8249 Family history of ischemic heart disease and other diseases of the circulatory system: Secondary | ICD-10-CM | POA: Diagnosis not present

## 2018-04-15 DIAGNOSIS — I208 Other forms of angina pectoris: Secondary | ICD-10-CM | POA: Diagnosis not present

## 2018-04-15 DIAGNOSIS — N529 Male erectile dysfunction, unspecified: Secondary | ICD-10-CM | POA: Diagnosis not present

## 2018-04-15 DIAGNOSIS — M179 Osteoarthritis of knee, unspecified: Secondary | ICD-10-CM | POA: Insufficient documentation

## 2018-04-15 DIAGNOSIS — R5383 Other fatigue: Secondary | ICD-10-CM | POA: Diagnosis not present

## 2018-04-15 DIAGNOSIS — I10 Essential (primary) hypertension: Secondary | ICD-10-CM | POA: Diagnosis not present

## 2018-04-15 DIAGNOSIS — R531 Weakness: Secondary | ICD-10-CM | POA: Diagnosis not present

## 2018-04-15 DIAGNOSIS — I429 Cardiomyopathy, unspecified: Secondary | ICD-10-CM | POA: Insufficient documentation

## 2018-04-15 DIAGNOSIS — F419 Anxiety disorder, unspecified: Secondary | ICD-10-CM | POA: Insufficient documentation

## 2018-04-15 DIAGNOSIS — Z87891 Personal history of nicotine dependence: Secondary | ICD-10-CM | POA: Diagnosis not present

## 2018-04-15 HISTORY — PX: LEFT HEART CATH AND CORONARY ANGIOGRAPHY: CATH118249

## 2018-04-15 SURGERY — LEFT HEART CATH AND CORONARY ANGIOGRAPHY
Anesthesia: LOCAL

## 2018-04-15 MED ORDER — ONDANSETRON HCL 4 MG/2ML IJ SOLN: 4.0000 mg | Freq: Four times a day (QID) | INTRAMUSCULAR | Status: DC | PRN

## 2018-04-15 MED ORDER — ACETAMINOPHEN 325 MG PO TABS: 650.0000 mg | ORAL_TABLET | ORAL | Status: DC | PRN

## 2018-04-15 MED ORDER — SODIUM CHLORIDE 0.9% FLUSH: 3.0000 mL | INTRAVENOUS | Status: DC | PRN

## 2018-04-15 MED ORDER — ASPIRIN 81 MG PO CHEW: 81.0000 mg | CHEWABLE_TABLET | ORAL | Status: DC

## 2018-04-15 MED ORDER — SODIUM CHLORIDE 0.9 % IV SOLN: 250.0000 mL | INTRAVENOUS | Status: DC | PRN

## 2018-04-15 MED ORDER — IOHEXOL 350 MG/ML SOLN
INTRAVENOUS | Status: DC | PRN
  Administered 2018-04-15: 75 mL via INTRA_ARTERIAL

## 2018-04-15 MED ORDER — VERAPAMIL HCL 2.5 MG/ML IV SOLN
INTRAVENOUS | Status: AC
  Filled 2018-04-15: qty 2

## 2018-04-15 MED ORDER — SODIUM CHLORIDE 0.9 % IV SOLN: INTRAVENOUS | Status: DC

## 2018-04-15 MED ORDER — MIDAZOLAM HCL 2 MG/2ML IJ SOLN
INTRAMUSCULAR | Status: AC
  Filled 2018-04-15: qty 2

## 2018-04-15 MED ORDER — MIDAZOLAM HCL 2 MG/2ML IJ SOLN
INTRAMUSCULAR | Status: DC | PRN
  Administered 2018-04-15: 2 mg via INTRAVENOUS

## 2018-04-15 MED ORDER — SODIUM CHLORIDE 0.9% FLUSH: 3.0000 mL | Freq: Two times a day (BID) | INTRAVENOUS | Status: DC

## 2018-04-15 MED ORDER — HEPARIN (PORCINE) IN NACL 1000-0.9 UT/500ML-% IV SOLN
INTRAVENOUS | Status: DC | PRN
  Administered 2018-04-15: 500 mL

## 2018-04-15 MED ORDER — FENTANYL CITRATE (PF) 100 MCG/2ML IJ SOLN
INTRAMUSCULAR | Status: AC
  Filled 2018-04-15: qty 2

## 2018-04-15 MED ORDER — SODIUM CHLORIDE 0.9 % WEIGHT BASED INFUSION: 1.0000 mL/kg/h | INTRAVENOUS | Status: DC

## 2018-04-15 MED ORDER — HEPARIN SODIUM (PORCINE) 1000 UNIT/ML IJ SOLN
INTRAMUSCULAR | Status: AC
  Filled 2018-04-15: qty 1

## 2018-04-15 MED ORDER — FENTANYL CITRATE (PF) 100 MCG/2ML IJ SOLN
INTRAMUSCULAR | Status: DC | PRN
  Administered 2018-04-15: 25 ug via INTRAVENOUS

## 2018-04-15 MED ORDER — HEPARIN SODIUM (PORCINE) 1000 UNIT/ML IJ SOLN
INTRAMUSCULAR | Status: DC | PRN
  Administered 2018-04-15: 4500 [IU] via INTRAVENOUS

## 2018-04-15 MED ORDER — HEPARIN (PORCINE) IN NACL 1000-0.9 UT/500ML-% IV SOLN
INTRAVENOUS | Status: AC
  Filled 2018-04-15: qty 1000

## 2018-04-15 MED ORDER — SODIUM CHLORIDE 0.9 % WEIGHT BASED INFUSION
3.0000 mL/kg/h | INTRAVENOUS | Status: AC
  Administered 2018-04-15: 3 mL/kg/h via INTRAVENOUS

## 2018-04-15 MED ORDER — LIDOCAINE HCL (PF) 1 % IJ SOLN
INTRAMUSCULAR | Status: AC
  Filled 2018-04-15: qty 30

## 2018-04-15 MED ORDER — LIDOCAINE HCL (PF) 1 % IJ SOLN
INTRAMUSCULAR | Status: DC | PRN
  Administered 2018-04-15: 2 mL

## 2018-04-15 MED ORDER — VERAPAMIL HCL 2.5 MG/ML IV SOLN
INTRAVENOUS | Status: DC | PRN
  Administered 2018-04-15: 10 mL via INTRA_ARTERIAL

## 2018-04-15 SURGICAL SUPPLY — 11 items
CATH INFINITI 5FR ANG PIGTAIL (CATHETERS) ×2 IMPLANT
CATH OPTITORQUE TIG 4.0 5F (CATHETERS) ×2 IMPLANT
DEVICE RAD COMP TR BAND LRG (VASCULAR PRODUCTS) ×2 IMPLANT
GLIDESHEATH SLEND A-KIT 6F 22G (SHEATH) ×2 IMPLANT
GUIDEWIRE INQWIRE 1.5J.035X260 (WIRE) ×1 IMPLANT
INQWIRE 1.5J .035X260CM (WIRE) ×2
KIT HEART LEFT (KITS) ×2 IMPLANT
PACK CARDIAC CATHETERIZATION (CUSTOM PROCEDURE TRAY) ×2 IMPLANT
SYR MEDRAD MARK V 150ML (SYRINGE) ×2 IMPLANT
TRANSDUCER W/STOPCOCK (MISCELLANEOUS) ×2 IMPLANT
TUBING CIL FLEX 10 FLL-RA (TUBING) ×2 IMPLANT

## 2018-04-15 NOTE — Interval H&P Note (Signed)
History and Physical Interval Note:  04/15/2018 9:48 AM  Juan Montes  has presented today for surgery, with the diagnosis of Atypical Angina - Abnormal Nuclear Stress Test:  The various methods of treatment have been discussed with the patient and family. After consideration of risks, benefits and other options for treatment, the patient has consented to  Procedure(s): LEFT HEART CATH AND CORONARY ANGIOGRAPHY (N/A) with possible PERCUTANEOUS CORONARY INTERVENTION as a surgical intervention .  The patient's history has been reviewed, patient examined, no change in status, stable for surgery.  I have reviewed the patient's chart and labs.  Questions were answered to the patient's satisfaction.    Cath Lab Visit (complete for each Cath Lab visit)  Clinical Evaluation Leading to the Procedure:   ACS: No.  Non-ACS:    Anginal Classification: CCS II - Atypical Sx.  Anti-ischemic medical therapy: Minimal Therapy (1 class of medications)  Non-Invasive Test Results: Low-risk stress test findings: cardiac mortality <1%/year - but with reduced LVEF & continued Sx.  Prior CABG: No previous CABG   Glenetta Hew

## 2018-04-15 NOTE — Discharge Instructions (Signed)

## 2018-04-16 ENCOUNTER — Telehealth: Payer: Self-pay | Admitting: Cardiovascular Disease

## 2018-04-16 NOTE — Telephone Encounter (Signed)
New message   Patient's wife wants to know if it is okay for him to swim after the cath surgery on 04/15/2018? Please call the patient to discuss.

## 2018-04-16 NOTE — Telephone Encounter (Signed)
Called patient back about his wife's message. Informed patient that he should wait for about 2 weeks to be on the safe side to go swimming. Patient was concerned about his EF being low and having heart failure. Informed patient that he should keep a low salt diet, watch his cholesterol intake, and exercise to help improve his heart failure. Patient verbalized understanding. Encouraged patient to keep his office visit with Dr. Johnsie Cancel later this year.

## 2018-04-27 ENCOUNTER — Telehealth: Payer: Self-pay | Admitting: Cardiovascular Disease

## 2018-04-27 NOTE — Telephone Encounter (Signed)
New Message            Patient had a heart catherization, and he needs to know when he can resume normal activities. Please call and advise.

## 2018-04-28 NOTE — Telephone Encounter (Signed)
Please let him know that it is far enough out from the heart catheterization that he should be able to do what ever he wants, as long as he does not over exert. His heart muscle is a little weak, that is probably where the shortness of breath is coming from. Please make sure he has a follow-up appointment scheduled in the next 2 weeks, need to make sure his heart failure is managed appropriately Thank you

## 2018-04-29 NOTE — Telephone Encounter (Signed)
Left message for patient to contact office.

## 2018-04-30 NOTE — Telephone Encounter (Signed)
Returned call to patient Juan Montes's advice given.2 week follow up appointment scheduled with Juan Ferries PA 05/14/18 at 8:00 am.

## 2018-04-30 NOTE — Telephone Encounter (Signed)
Patient returning call, please call back.

## 2018-05-04 DIAGNOSIS — Z6830 Body mass index (BMI) 30.0-30.9, adult: Secondary | ICD-10-CM | POA: Diagnosis not present

## 2018-05-04 DIAGNOSIS — I428 Other cardiomyopathies: Secondary | ICD-10-CM | POA: Diagnosis not present

## 2018-05-04 DIAGNOSIS — I2510 Atherosclerotic heart disease of native coronary artery without angina pectoris: Secondary | ICD-10-CM | POA: Diagnosis not present

## 2018-05-04 DIAGNOSIS — J988 Other specified respiratory disorders: Secondary | ICD-10-CM | POA: Diagnosis not present

## 2018-05-04 DIAGNOSIS — I10 Essential (primary) hypertension: Secondary | ICD-10-CM | POA: Diagnosis not present

## 2018-05-13 DIAGNOSIS — R042 Hemoptysis: Secondary | ICD-10-CM | POA: Diagnosis not present

## 2018-05-13 DIAGNOSIS — Z6830 Body mass index (BMI) 30.0-30.9, adult: Secondary | ICD-10-CM | POA: Diagnosis not present

## 2018-05-13 DIAGNOSIS — R05 Cough: Secondary | ICD-10-CM | POA: Diagnosis not present

## 2018-05-13 DIAGNOSIS — I4891 Unspecified atrial fibrillation: Secondary | ICD-10-CM | POA: Diagnosis not present

## 2018-05-14 ENCOUNTER — Ambulatory Visit (INDEPENDENT_AMBULATORY_CARE_PROVIDER_SITE_OTHER): Payer: PPO | Admitting: Physician Assistant

## 2018-05-14 ENCOUNTER — Encounter: Payer: Self-pay | Admitting: Physician Assistant

## 2018-05-14 VITALS — BP 130/84 | HR 73 | Ht >= 80 in | Wt 192.0 lb

## 2018-05-14 DIAGNOSIS — I10 Essential (primary) hypertension: Secondary | ICD-10-CM | POA: Diagnosis not present

## 2018-05-14 DIAGNOSIS — I5042 Chronic combined systolic (congestive) and diastolic (congestive) heart failure: Secondary | ICD-10-CM

## 2018-05-14 DIAGNOSIS — I428 Other cardiomyopathies: Secondary | ICD-10-CM | POA: Diagnosis not present

## 2018-05-14 MED ORDER — ISOSORB DINITRATE-HYDRALAZINE 20-37.5 MG PO TABS: 1.0000 | ORAL_TABLET | Freq: Three times a day (TID) | ORAL | 0 refills | Status: DC

## 2018-05-14 NOTE — Progress Notes (Signed)
Cardiology Office Note   Date:  05/14/2018   ID:  Juan Montes, DOB April 01, 1951, MRN [id number]  PCP:  Crist Infante, MD Cardiologist:  Jenkins Rouge, MD 12/2013 Rosaria Ferries, PA-C 04/13/2018  No chief complaint on file.   History of Present Illness: Juan Montes is a 67 y.o. male with a history of HTN, HLD, PAF, LV thrombus by echo 09/2013 on Eliquis>>resolved and Eliquis stopped, POET 2015 w/out ECG changes  04/10/2018, DOE>>MV w/ some ischemia and decreased EF>>cath w/ no sig CAD 04/27/2018 phone notes regarding DOE>>appt made  Juan Montes presents for cardiology follow up.  He got some airway inflammation from using Chlorox, is on a short course of steroids.  The steroids are helping, he is not coughing nearly as much.  He had been coughing a great deal, not sleeping because of that. On the steroids, he has not been sleeping well either.   His activity level has been decreased recently because of the cough and his father's cardiac health issues (med rx only option for sig CAD).  His father also has dementia.  Has been checking his BP at home. 814/48-185/63 is the range. Most SBPs in the 130s .  Has not been waking up with lower extremity edema.  Denies orthopnea or PND.  Has not had chest pain.  Feels well, but is worried that is just because he has not been doing anything.  Wants to get back to his previous activity level.  However, he feels limited by shortness of breath.  He has always been very high energy and high intensity.  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  Is trying to eat healthy, feels he is doing pretty well with that.   Past Medical History:  Diagnosis Date  . A-fib ([place]) 09/20/2013  . Anxiety disorder 12/19/2009  . Arthritis of knee, degenerative 12/19/2009  . BP (high blood pressure) 12/19/2009  . Clinical depression 12/19/2009  . Depression   . 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 ED  (erectile dysfunction) of organic origin 09/02/2011  . Elevated fasting blood sugar 12/19/2009  . Fatigue 12/19/2009  . HLD (hyperlipidemia) 12/19/2009  . HTN (hypertension)     Past Surgical History:  Procedure Laterality Date  . LEFT HEART CATH AND CORONARY ANGIOGRAPHY N/A 04/15/2018   Procedure: LEFT HEART CATH AND CORONARY ANGIOGRAPHY;  Surgeon: Leonie Man, MD;  Location: [facility];  Service: Cardiovascular;  Laterality: N/A;    Current Outpatient Medications  Medication Sig Dispense Refill  . acetaminophen (TYLENOL) 500 MG tablet Take 500 mg by mouth every 6 (six) hours as needed (for pain.).    Marland Kitchen amLODipine (NORVASC) 10 MG tablet Take 10 mg by mouth daily.    Marland Kitchen aspirin EC 81 MG tablet Take 81 mg by mouth daily.    . calcium carbonate (TUMS) 500 MG chewable tablet Chew 1-2 tablets by mouth 3 (three) times daily as needed for indigestion or heartburn.     . Cholecalciferol (VITAMIN D3) 2000 units TABS Take 2,000 Units by mouth daily.    . diphenhydramine-acetaminophen (TYLENOL PM) 25-500 MG TABS tablet Take 1 tablet by mouth at bedtime as needed (pain/sleep).    Marland Kitchen lisinopril-hydrochlorothiazide (PRINZIDE,ZESTORETIC) 20-25 MG per tablet Take 1 tablet by mouth daily.    . metoprolol tartrate (LOPRESSOR) 50 MG tablet Take 50 mg by mouth 2 (two) times daily.   3  . sertraline (ZOLOFT)  100 MG tablet Take 100 mg by mouth daily.      No current facility-administered medications for this visit.     Allergies:   Celebrex [celecoxib]    Social History:  The patient  reports that he has quit smoking. He has never used smokeless tobacco. He reports that he does not drink alcohol or use drugs.   Family History:  [name] includes Heart attack (age of onset: 78) in his brother; Hypertension in his brother and father; Kidney Stones in his mother.  He indicated that his mother is alive. He indicated that his father is alive. He indicated that his brother is  alive.    ROS:  Please see the history of present illness. All other systems are reviewed and negative.    PHYSICAL EXAM: VS:  BP 130/84   Pulse 73   Ht 7' (2.134 m)   Wt 192 lb (87.1 kg)   BMI 19.13 kg/m  , BMI Body mass index is 19.13 kg/m. GEN: Well nourished, well developed, male in no acute distress HEENT: normal for age  Neck: no JVD, no carotid bruit, no masses Cardiac: RRR; no murmur, no rubs, or gallops Respiratory:  clear to auscultation bilaterally, normal work of breathing GI: soft, nontender, nondistended, + BS MS: no deformity or atrophy; no edema; distal pulses are 2+ in all 4 extremities  Skin: warm and dry, no rash Neuro:  Strength and sensation are intact Psych: euthymic mood, full affect   EKG:  EKG is not ordered today.   ECHO: 03/16/2018 - Procedure narrative: Transthoracic echocardiography. Image   quality was adequate. Intravenous contrast (Definity) was   administered. - Left ventricle: The cavity size was mildly dilated. Systolic   function was mildly reduced. The estimated ejection fraction was   in the range of 45% to 50%. Diffuse hypokinesis. Doppler   parameters are consistent with abnormal left ventricular   relaxation (grade 1 diastolic dysfunction). - Aortic valve: There was trivial regurgitation. - Aorta: Aortic root dimension: 40 mm (ED). - Ascending aorta: The ascending aorta was mildly dilated. - Mitral valve: Mildly thickened leaflets . There was trivial   regurgitation. - Right ventricle: The cavity size was mildly dilated. Mcclintock   thickness was normal.   CATH: 04/15/2018  Lat Ramus lesion is 40% stenosed. Otherwise minimal CAD.  There is mild left ventricular systolic dysfunction. The left ventricular ejection fraction is 45-50% by visual estimate.   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  This could potentially be the reason for his symptoms, but not anginal equivalent.  Recommendation: Discharge home after  bedrest and TR band removal.  Follow-up as scheduled.  No indication for antiplatelet therapy at this time.     Recent Labs: 04/13/2018: BUN 18; Creatinine, Ser 0.93; Hemoglobin 14.6; Platelets 224; Potassium 4.0; Sodium 141  CBC    Component Value Date/Time   WBC 5.7 04/13/2018 1226   WBC 4.8 02/11/2018 1059   RBC 4.64 04/13/2018 1226   RBC 4.60 02/11/2018 1059   HGB 14.6 04/13/2018 1226   HCT 44.1 04/13/2018 1226   PLT 224 04/13/2018 1226   MCV 95 04/13/2018 1226   MCH 31.5 04/13/2018 1226   MCH 31.7 02/11/2018 1059   MCHC 33.1 04/13/2018 1226   MCHC 33.6 02/11/2018 1059   RDW 13.5 04/13/2018 1226   LYMPHSABS 1.4 09/22/2013 1353   MONOABS 0.5 09/22/2013 1353   EOSABS 0.1 09/22/2013 1353   BASOSABS 0.0 09/22/2013 1353   CMP Latest  Ref Rng & Units 04/13/2018 02/11/2018 09/22/2013  Glucose 65 - 99 mg/dL 79 103(H) 76  BUN 8 - 27 mg/dL 18 17 19   Creatinine 0.76 - 1.27 mg/dL 0.93 0.97 1.0  Sodium 134 - 144 mmol/L 141 139 137  Potassium 3.5 - 5.2 mmol/L 4.0 3.3(L) 3.2(L)  Chloride 96 - 106 mmol/L 97 104 102  CO2 20 - 29 mmol/L 25 28 27   Calcium 8.6 - 10.2 mg/dL 9.8 9.1 9.2     Lipid Panel No results found for: CHOL, TRIG, HDL, CHOLHDL, VLDL, LDLCALC, LDLDIRECT   Wt Readings from Last 3 Encounters:  05/14/18 192 lb (87.1 kg)  04/15/18 189 lb (85.7 kg)  04/13/18 189 lb (85.7 kg)     Other studies Reviewed: Additional studies/ records that were reviewed today include: Office notes, hospital records and testing.  ASSESSMENT AND PLAN:  1.  Chronic combined systolic and diastolic CHF: His EF is 38% and he has grade 1 diastolic dysfunction. - He is tolerating lisinopril HCTZ 20-25 mg and metoprolol 50 mg twice daily. - Add BiDil, and see how tolerated. - I explained that we want him to exercise and we want him to be active.  However, his heart will struggle to keep up if he tries to push himself too hard.  He agrees to limit the intensity of his activity.  2.   Hypertension: I explained that his goal blood sugar is less than 130/80. - He is a little bit above this, and I think he will tolerate the BiDil. - I explained that amlodipine is not the best blood pressure medication for people with weak hearts.  If he has any issues with his blood pressure, decrease the amlodipine  3.  Nonischemic cardiomyopathy: Recheck an echocardiogram in 3 months  Plan: The [place] office is more convenient for him, he wishes to change to a cardiologist out of this office.  We will facilitate  Current medicines are reviewed at length with the patient today.  The patient does not have concerns regarding medicines.  The following changes have been made: Add BiDil  Labs/ tests ordered today include:   Orders Placed This Encounter  Procedures  . ECHOCARDIOGRAM COMPLETE     Disposition:   FU with MD in [employer], Rosaria Ferries, PA-C  05/14/2018 9:22 AM    [facility] Phone: [phone]; Fax: [phone]  This note was written with the assistance of speech recognition software.  Please excuse any transcriptional errors.

## 2018-05-14 NOTE — Patient Instructions (Signed)
Medication Instructions:  Start Bidil 20/37.5 mg tablet 3 times daily.  If you need a refill on your cardiac medications before your next appointment, please call your pharmacy.   Lab work: None Ordered. If you have labs (blood work) drawn today and your tests are completely normal, you will receive your results only by: Marland Kitchen MyChart Message (if you have MyChart) OR . A paper copy in the mail If you have any lab test that is abnormal or we need to change your treatment, we will call you to review the results.  Testing/Procedures: Your physician has requested that you have an echocardiogram. Echocardiography is a painless test that uses sound waves to create images of your heart. It provides your doctor with information about the size and shape of your heart and how well your heart's chambers and valves are working. This procedure takes approximately one hour. There are no restrictions for this procedure.  Follow-Up: At [facility], you and your health needs are our priority.  As part of our continuing mission to provide you with exceptional heart care, we have created designated Provider Care Teams.  These Care Teams include your primary Cardiologist (physician) and Advanced Practice Providers (APPs -  Physician Assistants and Nurse Practitioners) who all work together to provide you with the care you need, when you need it. Areatha Keas Providers. Needs New Patient appointment with MD in 3 months.  . Schedule ECHO before this appointment.  Any Other Special Instructions Will Be Listed Below (If Applicable). Limit Sodium intake to 500 mg per meal daily. Unless sweating a lot limit fluid to 2 Liters daily. Goal BP is less than 794/32 If systolic BP number is less than 100, cut Amlodipine in half.

## 2018-06-11 ENCOUNTER — Ambulatory Visit: Payer: PPO | Admitting: Cardiovascular Disease

## 2018-06-18 DIAGNOSIS — Z23 Encounter for immunization: Secondary | ICD-10-CM | POA: Diagnosis not present

## 2018-07-20 DIAGNOSIS — Z6830 Body mass index (BMI) 30.0-30.9, adult: Secondary | ICD-10-CM | POA: Diagnosis not present

## 2018-07-20 DIAGNOSIS — R05 Cough: Secondary | ICD-10-CM | POA: Diagnosis not present

## 2018-07-20 DIAGNOSIS — R6883 Chills (without fever): Secondary | ICD-10-CM | POA: Diagnosis not present

## 2018-08-11 ENCOUNTER — Ambulatory Visit (HOSPITAL_COMMUNITY): Payer: PPO | Attending: Cardiology

## 2018-08-11 ENCOUNTER — Other Ambulatory Visit: Payer: Self-pay

## 2018-08-11 DIAGNOSIS — I428 Other cardiomyopathies: Secondary | ICD-10-CM | POA: Diagnosis not present

## 2018-08-18 NOTE — Progress Notes (Signed)
Cardiology Office Note:    Date:  08/19/2018   ID:  Juan Montes, DOB [birth year], MRN [id number]  PCP:  Crist Infante, MD  Cardiologist:  Buford Dresser, MD PhD  Referring MD: Crist Infante, MD   CC: new to me/follow up prior cardiac issues  History of Present Illness:    Juan Montes is a 68 y.o. male with a hx of HTN, HLD, atrial fibrillation, prior LV thrombus with resolution (2015),  who is seen for the evaluation and management of his cardiac conditions. He was last seen by Rosaria Ferries on 05/14/18 and previously by Dr. Johnsie Cancel in 2015.  Patient concerns today:  -wants to discuss heart failure, whether his function will improve. We discussed etiology and management of heart failure.  -under a lot of stress. 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 Has had multiple lung issues with this requiring steroids/antibiotics.  Has noticed weight gain, he is a stress eater (gained about 15-20 lbs). Interested in losing weight. No LE edema  Does snore at night.Wakes up fatigued. Energy level is low, doesn't feel well when he wakes up in the morning. Sleeps on 1 pillow, no PND/orthopnea. Used to go to the gym, but has decreased his activity level over time. Has painted ceilings recently, which is the most active thing he has done recently. Has knee arthritis, avoids stairs, etc.   Past Medical History:  Diagnosis Date  . A-fib ([place]) 09/20/2013  . Anxiety disorder 12/19/2009  . Arthritis of knee, degenerative 12/19/2009  . BP (high blood pressure) 12/19/2009  . Clinical depression 12/19/2009  . Depression   . 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 Elevated fasting blood sugar 12/19/2009  . Fatigue 12/19/2009  . HLD (hyperlipidemia) 12/19/2009  . HTN (hypertension)     Past Surgical History:  Procedure Laterality Date  . LEFT HEART CATH  AND CORONARY ANGIOGRAPHY N/A 04/15/2018   Procedure: LEFT HEART CATH AND CORONARY ANGIOGRAPHY;  Surgeon: Leonie Man, MD;  Location: [facility];  Service: Cardiovascular;  Laterality: N/A;    Current Medications: Current Outpatient Medications on File Prior to Visit  Medication Sig  . acetaminophen (TYLENOL) 500 MG tablet Take 500 mg by mouth every 6 (six) hours as needed (for pain.).  Marland Kitchen amLODipine (NORVASC) 10 MG tablet Take 10 mg by mouth daily.  Marland Kitchen aspirin EC 81 MG tablet Take 81 mg by mouth daily.  . calcium carbonate (TUMS) 500 MG chewable tablet Chew 1-2 tablets by mouth 3 (three) times daily as needed for indigestion or heartburn.   . Cholecalciferol (VITAMIN D3) 2000 units TABS Take 2,000 Units by mouth daily.  . diphenhydramine-acetaminophen (TYLENOL PM) 25-500 MG TABS tablet Take 1 tablet by mouth at bedtime as needed (pain/sleep).  Marland Kitchen lisinopril-hydrochlorothiazide (PRINZIDE,ZESTORETIC) 20-25 MG per tablet Take 1 tablet by mouth daily.  . sertraline (ZOLOFT) 100 MG tablet Take 100 mg by mouth daily.    No current facility-administered medications on file prior to visit.      Allergies:   Celebrex [celecoxib]   Social History   Socioeconomic History  . Marital status: Married    Spouse name: Not on file  . Number of children: Not on file  . Years of education: Not on file  . Highest education level: Not on file  Occupational History  . Not on file  Social  Needs  . Financial resource strain: Not on file  . Food insecurity:    Worry: Not on file    Inability: Not on file  . Transportation needs:    Medical: Not on file    Non-medical: Not on file  Tobacco Use  . Smoking status: Former Research scientist (life sciences)  . Smokeless tobacco: Never Used  . Tobacco comment: Quit about 3-4 months ago the occasional cigar  Substance and Sexual Activity  . Alcohol use: No  . Drug use: No  . Sexual activity: Not on file  Lifestyle  . Physical activity:    Days per week: Not on file     Minutes per session: Not on file  . Stress: Not on file  Relationships  . Social connections:    Talks on phone: Not on file    Gets together: Not on file    Attends religious service: Not on file    Active member of club or organization: Not on file    Attends meetings of clubs or organizations: Not on file    Relationship status: Not on file  Other Topics Concern  . Not on file  Social History Narrative  . Not on file     Family History: [name] includes Heart attack (age of onset: 43) in his brother; Hypertension in his brother and father; Kidney Stones in his mother. Father has severe CAD, med mgmt only option.  ROS:   Please see the history of present illness.  Additional pertinent ROS:  Constitutional: Negative for chills, fever, night sweats, unintentional weight loss  HENT: Negative for ear pain and hearing loss.   Eyes: Negative for loss of vision and eye pain.  Respiratory: Positive for occasional shortness of breath/cough. Negative for sputum, wheezing.   Cardiovascular: Negative for chest pain, palpitations, PND, orthopnea, lower extremity edema and claudication.  Gastrointestinal: Negative for abdominal pain, melena, and hematochezia.  Genitourinary: Negative for dysuria and hematuria.  Musculoskeletal: Negative for falls and myalgias.  Skin: Negative for itching and rash.  Neurological: Negative for focal weakness, focal sensory changes and loss of consciousness.  Endo/Heme/Allergies: Does not bruise/bleed easily.    EKGs/Labs/Other Studies Reviewed:    The following studies were reviewed today: ECHO: 03/16/2018 -The cavity size was mildly dilated. Systolic function was mildly reduced. The estimated ejection fraction was in the range of 45% to 50%. Diffuse hypokinesis. Doppler parameters are consistent with abnormal left ventricular relaxation (grade 1 diastolic dysfunction). - Aortic valve: There was trivial regurgitation. -  Aorta: Aortic root dimension: 40 mm (ED). - Ascending aorta: The ascending aorta was mildly dilated. - Mitral valve: Mildly thickened leaflets . There was trivial regurgitation. - Right ventricle: The cavity size was mildly dilated. Spurrier thickness was normal.   CATH: 04/15/2018  Lat Ramus lesion is 40% stenosed. Otherwise minimal CAD.  There is mild left ventricular systolic dysfunction. The left ventricular ejection fraction is 45-50% by visual estimate.  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 This could potentially be the reason for his symptoms, but not anginal equivalent.   EKG:  EKG is personally reviewed.  The ekg ordered 04/13/18 demonstrates NSR  Recent Labs: 04/13/2018: BUN 18; Creatinine, Ser 0.93; Hemoglobin 14.6; Platelets 224; Potassium 4.0; Sodium 141  Recent Lipid Panel No results found for: CHOL, TRIG, HDL, CHOLHDL, VLDL, LDLCALC, LDLDIRECT  Physical Exam:    VS:  BP 128/78   Pulse (!) 56   Ht 5\' 7"  (1.702 m)   Wt 199 lb 9.6  oz (90.5 kg)   SpO2 96%   BMI 31.26 kg/m     Wt Readings from Last 3 Encounters:  08/19/18 199 lb 9.6 oz (90.5 kg)  05/14/18 192 lb (87.1 kg)  04/15/18 189 lb (85.7 kg)     GEN: Well nourished, well developed in no acute distress HEENT: Normal NECK: No JVD; No carotid bruits LYMPHATICS: No lymphadenopathy CARDIAC: regular rhythm, normal S1 and S2, no murmurs, rubs, gallops. Radial and DP pulses 2+ bilaterally. RESPIRATORY:  Clear to auscultation without rales, wheezing or rhonchi  ABDOMEN: Soft, non-tender, non-distended MUSCULOSKELETAL:  No edema; No deformity  SKIN: Warm and dry NEUROLOGIC:  Alert and oriented x 3 PSYCHIATRIC:  Normal affect   ASSESSMENT:    1. Chronic combined systolic and diastolic heart failure ([place])   2. Medication management   3. Snores   4. Encounter for education about heart failure    PLAN:    1. 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 He would like to see if he tolerates, what it will cost him. Will have him go to PharmD clinic for initiation. Given instructions to hold lisinopril prior to entresto start.  -ordered for BiDil in 04/2018, too expensive, didn't start. Will not continue given plans for entresto  -rechecked echocardiogram 08/11/18, EF down to 30-35%.  -discussed etiology, heart failure education Do the following things EVERY DAY:  1) Weigh yourself EVERY morning after you go to the bathroom but before you eat or drink anything. Write this number down in a weight log/diary. If you gain 3 pounds overnight or 5 pounds in a week, call the office.  2) Take your medicines as prescribed. If you have concerns about your medications, please call us before you stop taking them.   3) Eat low salt foods-Limit salt (sodium) to 2000 mg per day. This will help prevent your body from holding onto fluid. Read food labels as many processed foods have a lot of sodium, especially canned goods and prepackaged meats. If you would like some assistance choosing low sodium foods, we would be happy to set you up with a nutritionist.  4) Stay as active as you can everyday. Staying active will give you more energy and make your muscles stronger. Start with 5 minutes at a time and work your way up to 30 minutes a day. Break up your activities--do some in the morning and some in the afternoon. Start with 3 days per week and work your way up to 5 days as you can.  If you have chest pain, feel short of breath, dizzy, or lightheaded, STOP. If you don't feel better after a short rest, call 911. If you do feel better, call the office to let us know you have symptoms with exercise.  5) Limit all fluids for the day to less than 2 liters. Fluid includes all drinks, coffee, juice, ice chips, soup, jello, and all  other liquids.  2. Hypertension: well controlled today. Adjusting medications as above. At home, 148/95, lowest 122/78. Should have room to titrate entresto  3. History of atrial fibrillation: diagnosed 4.5 years ago after an event at work. Paroxysmal, has occurred with stress/dental procedures in the past. He can tell when he is in afib generally.  -CHA2DS2/VAS Stroke Risk Points=3  -was on apixaban in the past for LV thrombus, but was not on blood  thinner for atrial fibrillation -concerned about cost of medications. We will start with entresto, and then we will discuss restart of DOAC at follow up.   4. Snoring, afib, HTN, fatigue: referred for sleep study  5. Secondary prevention: -recommend heart healthy/Mediterranean diet, with whole grains, fruits, vegetable, fish, lean meats, nuts, and olive oil. Limit salt. -recommend moderate walking, 3-5 times/week for 30-50 minutes each session. Aim for at least 150 minutes.week. Goal should be pace of 3 miles/hours, or walking 1.5 miles in 30 minutes -recommend avoidance of tobacco products. Avoid excess alcohol. -Additional risk factor control:  -Diabetes: A1c is 5.0%, no history  -Lipids: LDL 131 in 01/2018. Goal < 70 given nonobstructive CAD. Discuss at next visit, draw lipids at entresto check  -Blood pressure control: as goal, above  -Weight: BMI 31, interested in weight loss  Plan for follow up: 2 mos  TIME SPENT WITH PATIENT: 25 minutes of direct patient care. More than 50% of that time was spent on coordination of care and counseling regarding heart failure, test results, med changes.  Buford Dresser, MD, PhD [place]  [facility]   Medication Adjustments/Labs and Tests Ordered: Current medicines are reviewed at length with the patient today.  Concerns regarding medicines are outlined above.  Orders Placed This Encounter  Procedures  . Basic metabolic panel  . Lipid panel  . Split night study   Meds ordered this  encounter  Medications  . metoprolol succinate (TOPROL-XL) 100 MG 24 hr tablet    Sig: Take 1 tablet (100 mg total) by mouth daily. Take with or immediately following a meal.    Dispense:  90 tablet    Refill:  3    Patient Instructions  Medication Instructions:  Stop: Metoprolol tartrate(lopressor) 50 mg two times a day Start: Metoprolol Succinate(Toprol) 100 mg daily  Stop lisinopril three days before PharmD appointment, in preparation for starting entresto We will talk about apixaban at the next visit.  If you need a refill on your cardiac medications before your next appointment, please call your pharmacy.   Lab work: Your physician recommends that you return for lab work the day of Pham D appointment. (Lipid, BMP)  If you have labs (blood work) drawn today and your tests are completely normal, you will receive your results only by: Marland Kitchen MyChart Message (if you have MyChart) OR . A paper copy in the mail If you have any lab test that is abnormal or we need to change your treatment, we will call you to review the results.  Testing/Procedures: Your physician has recommended that you have a sleep study. This test records several body functions during sleep, including: brain activity, eye movement, oxygen and carbon dioxide blood levels, heart rate and rhythm, breathing rate and rhythm, the flow of air through your mouth and nose, snoring, body muscle movements, and chest and belly movement. [facility]   Follow-Up: At [facility], you and your health needs are our priority.  As part of our continuing mission to provide you with exceptional heart care, we have created designated Provider Care Teams.  These Care Teams include your primary Cardiologist (physician) and Advanced Practice Providers (APPs -  Physician Assistants and Nurse Practitioners) who all work together to provide you with the care you need, when you need it. You will need a follow up appointment in 2 months.   Please call our office 2 months in advance to schedule this appointment.  You may see Buford Dresser, MD or one  of the following Advanced Practice Providers on your designated Care Team:   Rosaria Ferries, PA-C . Jory Sims, DNP, ANP  Your physician recommends that you schedule a follow-up appointment in 3-4 weeks to discuss Entresto.         Signed, Buford Dresser, MD PhD 08/19/2018 11:45 PM    [place]

## 2018-08-19 ENCOUNTER — Encounter: Payer: Self-pay | Admitting: Cardiology

## 2018-08-19 ENCOUNTER — Ambulatory Visit (INDEPENDENT_AMBULATORY_CARE_PROVIDER_SITE_OTHER): Payer: PPO | Admitting: Cardiology

## 2018-08-19 VITALS — BP 128/78 | HR 56 | Ht 67.0 in | Wt 199.6 lb

## 2018-08-19 DIAGNOSIS — R0683 Snoring: Secondary | ICD-10-CM | POA: Diagnosis not present

## 2018-08-19 DIAGNOSIS — Z7189 Other specified counseling: Secondary | ICD-10-CM | POA: Diagnosis not present

## 2018-08-19 DIAGNOSIS — Z79899 Other long term (current) drug therapy: Secondary | ICD-10-CM | POA: Diagnosis not present

## 2018-08-19 DIAGNOSIS — I5042 Chronic combined systolic (congestive) and diastolic (congestive) heart failure: Secondary | ICD-10-CM | POA: Diagnosis not present

## 2018-08-19 DIAGNOSIS — I480 Paroxysmal atrial fibrillation: Secondary | ICD-10-CM | POA: Diagnosis not present

## 2018-08-19 MED ORDER — METOPROLOL SUCCINATE ER 100 MG PO TB24: 100.0000 mg | ORAL_TABLET | Freq: Every day | ORAL | 3 refills | Status: DC

## 2018-08-19 NOTE — Patient Instructions (Addendum)
Medication Instructions:  Stop: Metoprolol tartrate(lopressor) 50 mg two times a day Start: Metoprolol Succinate(Toprol) 100 mg daily  Stop lisinopril three days before PharmD appointment, in preparation for starting entresto We will talk about apixaban at the next visit.  If you need a refill on your cardiac medications before your next appointment, please call your pharmacy.   Lab work: Your physician recommends that you return for lab work the day of Pham D appointment. (Lipid, BMP)  If you have labs (blood work) drawn today and your tests are completely normal, you will receive your results only by: Marland Kitchen MyChart Message (if you have MyChart) OR . A paper copy in the mail If you have any lab test that is abnormal or we need to change your treatment, we will call you to review the results.  Testing/Procedures: Your physician has recommended that you have a sleep study. This test records several body functions during sleep, including: brain activity, eye movement, oxygen and carbon dioxide blood levels, heart rate and rhythm, breathing rate and rhythm, the flow of air through your mouth and nose, snoring, body muscle movements, and chest and belly movement. [facility]   Follow-Up: At [facility], you and your health needs are our priority.  As part of our continuing mission to provide you with exceptional heart care, we have created designated Provider Care Teams.  These Care Teams include your primary Cardiologist (physician) and Advanced Practice Providers (APPs -  Physician Assistants and Nurse Practitioners) who all work together to provide you with the care you need, when you need it. You will need a follow up appointment in 2 months.  Please call our office 2 months in advance to schedule this appointment.  You may see Buford Dresser, MD or one of the following Advanced Practice Providers on your designated Care Team:   Rosaria Ferries, PA-C . Jory Sims, DNP,  ANP  Your physician recommends that you schedule a follow-up appointment in 3-4 weeks to discuss Entresto.

## 2018-08-21 ENCOUNTER — Telehealth: Payer: Self-pay | Admitting: *Deleted

## 2018-08-21 ENCOUNTER — Encounter: Payer: Self-pay | Admitting: Cardiology

## 2018-08-21 NOTE — Telephone Encounter (Signed)
-----   Message from Meryl Crutch, RN sent at 08/19/2018  9:48 AM EST ----- Pt has an order for sleep study. thanks

## 2018-08-21 NOTE — Telephone Encounter (Signed)
PA submitted for split night sleep study to [facility] via web portal.

## 2018-08-26 ENCOUNTER — Telehealth: Payer: Self-pay | Admitting: *Deleted

## 2018-08-26 NOTE — Telephone Encounter (Signed)
Patient notified of 10/08/18 sleep study appointment.

## 2018-08-26 NOTE — Telephone Encounter (Signed)
-----   Message from Meryl Crutch, RN sent at 08/19/2018  9:48 AM EST ----- Pt has an order for sleep study. thanks

## 2018-08-27 ENCOUNTER — Ambulatory Visit (INDEPENDENT_AMBULATORY_CARE_PROVIDER_SITE_OTHER): Payer: PPO | Admitting: Pharmacist

## 2018-08-27 VITALS — BP 128/74 | HR 62 | Ht 67.0 in | Wt 199.4 lb

## 2018-08-27 DIAGNOSIS — I5042 Chronic combined systolic (congestive) and diastolic (congestive) heart failure: Secondary | ICD-10-CM | POA: Insufficient documentation

## 2018-08-27 MED ORDER — SACUBITRIL-VALSARTAN 49-51 MG PO TABS: 1.0000 | ORAL_TABLET | Freq: Two times a day (BID) | ORAL | 0 refills | Status: DC

## 2018-08-27 NOTE — Progress Notes (Signed)
Patient ID: [name]                 DOB: [birth year]                      MRN: [id number]     HPI: Juan Montes is a 68 y.o. male referred by Dr. [name] to pharmacist clinic for [facility] initiation and medication titration. PMH includes AFib, anxiety disorder, HTN, depression, hyperlipidemia, prior LV thrombus with resolution (2015), and HF with reduced EF of 30-35% on 08/11/2018.  Patient stopped taking lisinopril 3 days ago as instructed by Dr [name]. Denies swelling, shortens of breath, weight gain , headache or blurry vision.   Current HTN meds:  Amlodipine 10mg  daily Lisinopril/HCTZ 20-25 mg daily - stopped 3 days ago Metoprolol succinate 100mg  daily  BP goal: <130/80  Family History: [name] includes Heart attack (age of onset: 40) in his brother; Hypertension in his brother and father; Kidney Stones in his mother. Father has severe CAD, med mgmt only option.  Social History: denies alcohol, occasional smoker (cigar)  Diet: working on improving diet  Exercise: activities of daily living  Home BP readings: none provided  Wt Readings from Last 3 Encounters:  08/27/18 199 lb 6.4 oz (90.4 kg)  08/19/18 199 lb 9.6 oz (90.5 kg)  05/14/18 192 lb (87.1 kg)   BP Readings from Last 3 Encounters:  08/27/18 128/74  08/19/18 128/78  05/14/18 130/84   Pulse Readings from Last 3 Encounters:  08/27/18 62  08/19/18 (!) 56  05/14/18 73    Past Medical History:  Diagnosis Date  . A-fib ([place]) 09/20/2013  . Anxiety disorder 12/19/2009  . Arthritis of knee, degenerative 12/19/2009  . BP (high blood pressure) 12/19/2009  . Clinical depression 12/19/2009  . Depression   . 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 Elevated fasting blood sugar 12/19/2009  . Fatigue 12/19/2009  . HLD (hyperlipidemia) 12/19/2009  . HTN (hypertension)     Current Outpatient Medications on File Prior to Visit  Medication Sig  Dispense Refill  . acetaminophen (TYLENOL) 500 MG tablet Take 500 mg by mouth every 6 (six) hours as needed (for pain.).    Marland Kitchen amLODipine (NORVASC) 10 MG tablet Take 5 mg by mouth daily.    Marland Kitchen aspirin EC 81 MG tablet Take 81 mg by mouth daily.    . calcium carbonate (TUMS) 500 MG chewable tablet Chew 1-2 tablets by mouth 3 (three) times daily as needed for indigestion or heartburn.     . Cholecalciferol (VITAMIN D3) 2000 units TABS Take 2,000 Units by mouth daily.    . diphenhydramine-acetaminophen (TYLENOL PM) 25-500 MG TABS tablet Take 1 tablet by mouth at bedtime as needed (pain/sleep).    . metoprolol succinate (TOPROL-XL) 100 MG 24 hr tablet Take 1 tablet (100 mg total) by mouth daily. Take with or immediately following a meal. 90 tablet 3  . sertraline (ZOLOFT) 100 MG tablet Take 100 mg by mouth daily.      No current facility-administered medications on file prior to visit.     Allergies  Allergen Reactions  . Celebrex [Celecoxib]     itchy    Blood pressure 128/74, pulse 62, height 5\' 7"  (1.702 m), weight 199 lb 6.4 oz (90.4 kg).  Chronic combined systolic and diastolic heart failure (HCC) BP and HR appropriate for further medication titration. Patient stopped taking lisinopril 3 days ago; therefore  okay to initiate ENTRESTO today. Will decrease amlodipine to 5mg  daily and initiate Entresto at 49/51mg  twice daily. Patient is to repeat BMET in 10 days and f/u with pharmacist in 2 weeks for further Entresto titration.  [employer] patient assistance paperwork also provided today to complete if needed.    Nieves Barberi Rodriguez-Guzman PharmD, BCPS, CPP [place] [id number] 08/28/2018 12:15 PM

## 2018-08-27 NOTE — Patient Instructions (Signed)
Return for a  follow up appointment in 2 weeks  Go to the lab in 10 days  Check your blood pressure at home daily (if able) and keep record of the readings.  Take your BP meds as follows: *START taking Entresto 49/51mg  twice daily* *DECREASE amlodipine to 5mg  daily*  Bring all of your meds, your BP cuff and your record of home blood pressures to your next appointment.  Exercise as you're able, try to walk approximately 30 minutes per day.  Keep salt intake to a minimum, especially watch canned and prepared boxed foods.  Eat more fresh fruits and vegetables and fewer canned items.  Avoid eating in fast food restaurants.    HOW TO TAKE YOUR BLOOD PRESSURE: . Rest 5 minutes before taking your blood pressure. .  Don't smoke or drink caffeinated beverages for at least 30 minutes before. . Take your blood pressure before (not after) you eat. . Sit comfortably with your back supported and both feet on the floor (don't cross your legs). . Elevate your arm to heart level on a table or a desk. . Use the proper sized cuff. It should fit smoothly and snugly around your bare upper arm. There should be enough room to slip a fingertip under the cuff. The bottom edge of the cuff should be 1 inch above the crease of the elbow. . Ideally, take 3 measurements at one sitting and record the average.

## 2018-08-28 ENCOUNTER — Encounter: Payer: Self-pay | Admitting: Pharmacist

## 2018-08-28 NOTE — Assessment & Plan Note (Signed)
BP and HR appropriate for further medication titration. Patient stopped taking lisinopril 3 days ago; therefore okay to initiate ENTRESTO today. Will decrease amlodipine to 5mg  daily and initiate Entresto at 49/51mg  twice daily. Patient is to repeat BMET in 10 days and f/u with pharmacist in 2 weeks for further Entresto titration.  [employer] patient assistance paperwork also provided today to complete if needed.

## 2018-09-02 ENCOUNTER — Telehealth: Payer: Self-pay | Admitting: Cardiology

## 2018-09-02 NOTE — Telephone Encounter (Signed)
New Message:   Patient calling he has some concerns about his instructions about his lab work. Please call patient.

## 2018-09-02 NOTE — Telephone Encounter (Signed)
Called patient, he was confused on his visits, he seen PharmD last and she needs blood work before appointment with her in 2 weeks to further titrate medication.  Patient understood, he was thankful for the call had no other questions at this time, and will come for his blood work before the appointment with PharmD.

## 2018-09-03 DIAGNOSIS — I428 Other cardiomyopathies: Secondary | ICD-10-CM | POA: Diagnosis not present

## 2018-09-03 DIAGNOSIS — I5020 Unspecified systolic (congestive) heart failure: Secondary | ICD-10-CM | POA: Diagnosis not present

## 2018-09-03 DIAGNOSIS — I4891 Unspecified atrial fibrillation: Secondary | ICD-10-CM | POA: Diagnosis not present

## 2018-09-03 DIAGNOSIS — F3289 Other specified depressive episodes: Secondary | ICD-10-CM | POA: Diagnosis not present

## 2018-09-03 DIAGNOSIS — Z6831 Body mass index (BMI) 31.0-31.9, adult: Secondary | ICD-10-CM | POA: Diagnosis not present

## 2018-09-03 DIAGNOSIS — I2510 Atherosclerotic heart disease of native coronary artery without angina pectoris: Secondary | ICD-10-CM | POA: Diagnosis not present

## 2018-09-03 DIAGNOSIS — I10 Essential (primary) hypertension: Secondary | ICD-10-CM | POA: Diagnosis not present

## 2018-09-07 ENCOUNTER — Telehealth: Payer: Self-pay | Admitting: Cardiology

## 2018-09-07 ENCOUNTER — Ambulatory Visit: Payer: PPO

## 2018-09-07 DIAGNOSIS — Z79899 Other long term (current) drug therapy: Secondary | ICD-10-CM | POA: Diagnosis not present

## 2018-09-07 NOTE — Telephone Encounter (Signed)
New message   Pt is calling because he said he was suppose to get labs today but he wasn't sure if he was suppose to fast and he had a bowl of cereal and coffee early this morning. He needs to know if he can come at a later time today or another day Please advise

## 2018-09-07 NOTE — Telephone Encounter (Signed)
Appears pt is having BMET and does not need to be fasting Pt aware .Adonis Housekeeper

## 2018-09-08 LAB — LIPID PANEL
Chol/HDL Ratio: 3.4 ratio (ref 0.0–5.0)
Cholesterol, Total: 211 mg/dL — ABNORMAL HIGH (ref 100–199)
HDL: 62 mg/dL (ref >39)
LDL Calculated: 120 mg/dL — ABNORMAL HIGH (ref 0–99)
Triglycerides: 147 mg/dL (ref 0–149)
VLDL Cholesterol Cal: 29 mg/dL (ref 5–40)

## 2018-09-08 LAB — BASIC METABOLIC PANEL
BUN / CREAT RATIO: 15 (ref 10–24)
BUN: 16 mg/dL (ref 8–27)
CO2: 25 mmol/L (ref 20–29)
CREATININE: 1.07 mg/dL (ref 0.76–1.27)
Calcium: 9.5 mg/dL (ref 8.6–10.2)
Chloride: 103 mmol/L (ref 96–106)
GFR calc Af Amer: 83 mL/min/{1.73_m2} (ref >59)
GFR calc non Af Amer: 72 mL/min/{1.73_m2} (ref >59)
GLUCOSE: 83 mg/dL (ref 65–99)
Potassium: 4.6 mmol/L (ref 3.5–5.2)
SODIUM: 143 mmol/L (ref 134–144)

## 2018-09-10 ENCOUNTER — Encounter: Payer: Self-pay | Admitting: Pharmacist

## 2018-09-10 ENCOUNTER — Ambulatory Visit (INDEPENDENT_AMBULATORY_CARE_PROVIDER_SITE_OTHER): Payer: PPO | Admitting: Pharmacist

## 2018-09-10 VITALS — BP 118/76 | HR 74 | Ht 67.0 in | Wt 197.0 lb

## 2018-09-10 DIAGNOSIS — I5042 Chronic combined systolic (congestive) and diastolic (congestive) heart failure: Secondary | ICD-10-CM

## 2018-09-10 MED ORDER — SACUBITRIL-VALSARTAN 97-103 MG PO TABS: 1.0000 | ORAL_TABLET | Freq: Two times a day (BID) | ORAL | 0 refills | Status: DC

## 2018-09-10 NOTE — Assessment & Plan Note (Addendum)
BP stable during office visit and slightly elevate at home. Patient denies problems with Entresto and renal function remains stable. Will increase Entresto to 97/103mg  twice daily, repeat BMEt in 10 days and follow up in 2 weeks. Plan to discontinue amlodipine if patient becomes hypotensive. 30 day free card was provided to patient todqy. Paperwork for [employer] already faxed to determine eligibility to patient asistance program.

## 2018-09-10 NOTE — Patient Instructions (Addendum)
Return for a  follow up appointment in 2 weeks  Go to the lab in 10 days  Check your blood pressure at home daily (if able) and keep record of the readings.  Take your BP meds as follows: *NCREASE Entresto dose to 97/103mg  twice daily*  Bring all of your meds, your BP cuff and your record of home blood pressures to your next appointment.  Exercise as you're able, try to walk approximately 30 minutes per day.  Keep salt intake to a minimum, especially watch canned and prepared boxed foods.  Eat more fresh fruits and vegetables and fewer canned items.  Avoid eating in fast food restaurants.    HOW TO TAKE YOUR BLOOD PRESSURE: . Rest 5 minutes before taking your blood pressure. .  Don't smoke or drink caffeinated beverages for at least 30 minutes before. . Take your blood pressure before (not after) you eat. . Sit comfortably with your back supported and both feet on the floor (don't cross your legs). . Elevate your arm to heart level on a table or a desk. . Use the proper sized cuff. It should fit smoothly and snugly around your bare upper arm. There should be enough room to slip a fingertip under the cuff. The bottom edge of the cuff should be 1 inch above the crease of the elbow. . Ideally, take 3 measurements at one sitting and record the average.

## 2018-09-10 NOTE — Progress Notes (Signed)
Patient ID: Juan Montes                 DOB: 01-04-1951                      MRN: [id number]     HPI: Juan Montes is a 68 y.o. male referred by Dr. [name] to pharmacist clinic for ENTRESTO titration. PMH includes AFib, anxiety disorder, HTN, depression, hyperlipidemia, prior LV thrombus with resolution (2015), and HF with reduced EF of 30-35% on 08/11/2018.  During last visit we discontinued lisinopril, decreased amlodipine dose to 5mg  daily and started Entresto 49/51mg  twice daily. Patient denies swelling, shortens of breath, weight gain , headache or blurry vision. Noted recent BMET shows stable renal function and electrolytes.  Current HTN meds:  Amlodipine 5mg  daily Entresto 49/51mg  twice daily Metoprolol succinate 100mg  daily  BP goal: <130/80  Family History: [name] includes Heart attack (age of onset: 52) in his brother; Hypertension in his brother and father; Kidney Stones in his mother. Father has severe CAD, med mgmt only option.  Social History: denies alcohol, occasional smoker (cigar)  Diet: working on improving diet  Exercise: activities of daily living  Home BP readings: none provided today. Patient reports [id number] systolic BP at home and during recent PCP visit   Wt Readings from Last 3 Encounters:  09/10/18 197 lb (89.4 kg)  08/27/18 199 lb 6.4 oz (90.4 kg)  08/19/18 199 lb 9.6 oz (90.5 kg)   BP Readings from Last 3 Encounters:  09/10/18 118/76  08/27/18 128/74  08/19/18 128/78   Pulse Readings from Last 3 Encounters:  09/10/18 74  08/27/18 62  08/19/18 (!) 56    Past Medical History:  Diagnosis Date  . A-fib ([place]) 09/20/2013  . Anxiety disorder 12/19/2009  . Arthritis of knee, degenerative 12/19/2009  . BP (high blood pressure) 12/19/2009  . Clinical depression 12/19/2009  . Depression   . 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 Elevated fasting blood sugar 12/19/2009  .  Fatigue 12/19/2009  . HLD (hyperlipidemia) 12/19/2009  . HTN (hypertension)     Current Outpatient Medications on File Prior to Visit  Medication Sig Dispense Refill  . acetaminophen (TYLENOL) 500 MG tablet Take 500 mg by mouth every 6 (six) hours as needed (for pain.).    Marland Kitchen amLODipine (NORVASC) 10 MG tablet Take 5 mg by mouth daily.    Marland Kitchen aspirin EC 81 MG tablet Take 81 mg by mouth daily.    . calcium carbonate (TUMS) 500 MG chewable tablet Chew 1-2 tablets by mouth 3 (three) times daily as needed for indigestion or heartburn.     . Cholecalciferol (VITAMIN D3) 2000 units TABS Take 2,000 Units by mouth daily.    . diphenhydramine-acetaminophen (TYLENOL PM) 25-500 MG TABS tablet Take 1 tablet by mouth at bedtime as needed (pain/sleep).    . metoprolol succinate (TOPROL-XL) 100 MG 24 hr tablet Take 1 tablet (100 mg total) by mouth daily. Take with or immediately following a meal. 90 tablet 3  . sertraline (ZOLOFT) 100 MG tablet Take 100 mg by mouth daily.      No current facility-administered medications on file prior to visit.     Allergies  Allergen Reactions  . Celebrex [Celecoxib]     itchy    Blood pressure 118/76, pulse 74, height 5\' 7"  (1.702 m), weight 197 lb (89.4 kg).  Chronic combined systolic and diastolic  heart failure (HCC) BP stable during office visit and slightly elevate at home. Patient denies problems with Entresto and renal function remains stable. Will increase Entresto to 97/103mg  twice daily, repeat BMEt in 10 days and follow up in 2 weeks. Plan to discontinue amlodipine if patient becomes hypotensive. 30 day free card was provided to patient todqy. Paperwork for [employer] already faxed to determine eligibility to patient asistance program.    Juan Montes PharmD, BCPS, [place] [id number] 09/10/2018 5:05 PM

## 2018-09-16 ENCOUNTER — Telehealth: Payer: Self-pay

## 2018-09-16 NOTE — Telephone Encounter (Signed)
PA submitted to covermymeds for Entresto 97-103 mg on 09/16/2018. Awaiting approval.

## 2018-09-21 DIAGNOSIS — I5042 Chronic combined systolic (congestive) and diastolic (congestive) heart failure: Secondary | ICD-10-CM | POA: Diagnosis not present

## 2018-09-21 DIAGNOSIS — H2513 Age-related nuclear cataract, bilateral: Secondary | ICD-10-CM | POA: Diagnosis not present

## 2018-09-21 NOTE — Telephone Encounter (Signed)
Rep requesting the NYHA classification. Informed per Dr. [name], grade 2.

## 2018-09-21 NOTE — Telephone Encounter (Signed)
°  Follow up    Please Gregary Signs at [facility], she has additional questions Please call [phone]

## 2018-09-21 NOTE — Telephone Encounter (Signed)
Spoke with PA rep who states she is needing Dx code to process PA for [employer]. Chronic combined systolic and diastolic heart failure code I50.42 provided.

## 2018-09-21 NOTE — Telephone Encounter (Signed)
Follow Up:      Please call, she needs to ask a few more questions for his prior authorization for his Entresto.

## 2018-09-22 LAB — BASIC METABOLIC PANEL
BUN/Creatinine Ratio: 12 (ref 10–24)
BUN: 13 mg/dL (ref 8–27)
CO2: 24 mmol/L (ref 20–29)
Calcium: 9.4 mg/dL (ref 8.6–10.2)
Chloride: 104 mmol/L (ref 96–106)
Creatinine, Ser: 1.11 mg/dL (ref 0.76–1.27)
GFR calc Af Amer: 80 mL/min/{1.73_m2} (ref >59)
GFR calc non Af Amer: 69 mL/min/{1.73_m2} (ref >59)
Glucose: 80 mg/dL (ref 65–99)
Potassium: 4.5 mmol/L (ref 3.5–5.2)
Sodium: 142 mmol/L (ref 134–144)

## 2018-09-22 NOTE — Telephone Encounter (Signed)
Received PA fax stating Entresto 97-103 mg was approved through 09/21/19. Pt updated.

## 2018-09-23 ENCOUNTER — Ambulatory Visit (INDEPENDENT_AMBULATORY_CARE_PROVIDER_SITE_OTHER): Payer: PPO | Admitting: Pharmacist Clinician (PhC)/ Clinical Pharmacy Specialist

## 2018-09-23 DIAGNOSIS — I5042 Chronic combined systolic (congestive) and diastolic (congestive) heart failure: Secondary | ICD-10-CM

## 2018-09-23 MED ORDER — AMLODIPINE BESYLATE 5 MG PO TABS: 5.0000 mg | ORAL_TABLET | Freq: Every day | ORAL | 3 refills | Status: DC

## 2018-09-23 NOTE — Patient Instructions (Signed)
Your blood pressure today is 128/84  Do the following things EVERY DAY:  1) Weigh yourself EVERY morning after you go to the bathroom but before you eat or drink anything. Write this number down in a weight log/diary. If you gain 3 pounds overnight or 5 pounds in a week, call the office.  2) Take your medicines as prescribed. If you have concerns about your medications, please call us before you stop taking them.   3) Eat low salt foods-Limit salt (sodium) to 2000 mg per day. This will help prevent your body from holding onto fluid. Read food labels as many processed foods have a lot of sodium, especially canned goods and prepackaged meats. If you would like some assistance choosing low sodium foods, we would be happy to set you up with a nutritionist.  4) Stay as active as you can everyday. Staying active will give you more energy and make your muscles stronger. Start with 5 minutes at a time and work your way up to 30 minutes a day. Break up your activities--do some in the morning and some in the afternoon. Start with 3 days per week and work your way up to 5 days as you can.  If you have chest pain, feel short of breath, dizzy, or lightheaded, STOP. If you don't feel better after a short rest, call 911. If you do feel better, call the office to let us know you have symptoms with exercise.  5) Limit all fluids for the day to less than 2 liters. Fluid includes all drinks, coffee, juice, ice chips, soup, jello, and all other liquids.   Check your weight and blood pressure at home several times each week and keep record of the readings.  Continue with all your current medications  Bring all of your meds, your BP cuff and your record of home blood pressures to your next appointment.  Exercise as you're able, try to walk approximately 30 minutes per day.  Keep salt intake to a minimum, especially watch canned and prepared boxed foods.  Eat more fresh fruits and vegetables and fewer canned items.   Avoid eating in fast food restaurants.    HOW TO TAKE YOUR BLOOD PRESSURE: . Rest 5 minutes before taking your blood pressure. .  Don't smoke or drink caffeinated beverages for at least 30 minutes before. . Take your blood pressure before (not after) you eat. . Sit comfortably with your back supported and both feet on the floor (don't cross your legs). . Elevate your arm to heart level on a table or a desk. . Use the proper sized cuff. It should fit smoothly and snugly around your bare upper arm. There should be enough room to slip a fingertip under the cuff. The bottom edge of the cuff should be 1 inch above the crease of the elbow. . Ideally, take 3 measurements at one sitting and record the average.

## 2018-09-23 NOTE — Progress Notes (Signed)
Patient ID: Juan Montes                 DOB: 08-27-1950                      MRN: [id number]     HPI: Juan Montes is a 68 y.o. male referred by Dr. [name] to [place] clinic for ENTRESTO titration.  His medical history is significant for AFib, anxiety disorder, HTN, depression, hyperlipidemia, prior LV thrombus with resolution (2015), and HF with reduced EF of 30-35% by echo in January 2020.  He was originally on lisinopril hctz, but this was held x 36 hours and Entresto 49/51 mg was started.   His amlodipine was also decreased from 10 mg to 5 mg daily.  At his next visit we were able to increase the Entresto to 97/103 mg and labs drawn 10 days later showed stable creatinine and potassium.     He returns today for follow up.  He reports feeling well overall, no chest pain, shortness of breath, dizziness or edema.  He has not been weighing himself but feels as though his weight is steady.     Current HTN meds:   Amlodipine 5 mg daily - am Entresto 97/103 mg twice daily Metoprolol succinate 100 mg daily - am  BP goal: <130/80  Family History: [name] includes Heart attack (age of onset: 36) in his brother; Hypertension in his brother and father; Kidney Stones in his mother. Father has severe CAD, med mgmt only option.  Social History: denies alcohol, occasional smoker (cigar)  Diet: working on improving diet  Exercise: activities of daily living  Home BP readings: none provided today. He reports that after morning meds, pressure usually in the 130/80 range.  Occasionally has gone as high as 353 systolic, but usually back down within a couple of hours.   Wt Readings from Last 3 Encounters:  09/10/18 197 lb (89.4 kg)  08/27/18 199 lb 6.4 oz (90.4 kg)  08/19/18 199 lb 9.6 oz (90.5 kg)   BP Readings from Last 3 Encounters:  09/23/18 128/84  09/10/18 118/76  08/27/18 128/74   Pulse Readings from Last 3 Encounters:  09/23/18 68  09/10/18 74  08/27/18 62     Past Medical History:  Diagnosis Date  . A-fib ([place]) 09/20/2013  . Anxiety disorder 12/19/2009  . Arthritis of knee, degenerative 12/19/2009  . BP (high blood pressure) 12/19/2009  . Clinical depression 12/19/2009  . Depression   . 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 Elevated fasting blood sugar 12/19/2009  . Fatigue 12/19/2009  . HLD (hyperlipidemia) 12/19/2009  . HTN (hypertension)     Current Outpatient Medications on File Prior to Visit  Medication Sig Dispense Refill  . aspirin EC 81 MG tablet Take 81 mg by mouth daily.    . calcium carbonate (TUMS) 500 MG chewable tablet Chew 1-2 tablets by mouth 3 (three) times daily as needed for indigestion or heartburn.     . Cholecalciferol (VITAMIN D3) 2000 units TABS Take 2,000 Units by mouth daily.    . metoprolol succinate (TOPROL-XL) 100 MG 24 hr tablet Take 1 tablet (100 mg total) by mouth daily. Take with or immediately following a meal. 90 tablet 3  . sacubitril-valsartan (ENTRESTO) 97-103 MG Take 1 tablet by mouth 2 (two) times daily. 60 tablet 0  . sertraline (ZOLOFT) 100 MG tablet Take 50 mg by mouth daily.  No current facility-administered medications on file prior to visit.     Allergies  Allergen Reactions  . Celebrex [Celecoxib]     itchy    Blood pressure 128/84, pulse 68.   Chronic combined systolic and diastolic heart failure [facility]) Patient doing well with Entresto 97/103.  Blood pressure well controlled and patient asymptomatic.  He has noticed improvement in his ADLs.  Had a long discussion on the definition of heart failure, signs and symptoms to watch for and lifestyle modifications to help prevent acute exacerbations.  All questions were answered.  Patient should continue follow up with Dr. [name], but knows to call the office sooner if has any questions or concerns.   [name] PharmD CPP [facility] [address] [place] [address] 09/23/2018 10:48 AM

## 2018-09-23 NOTE — Assessment & Plan Note (Signed)
Patient doing well with Entresto 97/103.  Blood pressure well controlled and patient asymptomatic.  He has noticed improvement in his ADLs.  Had a long discussion on the definition of heart failure, signs and symptoms to watch for and lifestyle modifications to help prevent acute exacerbations.  All questions were answered.  Patient should continue follow up with Dr. [name], but knows to call the office sooner if has any questions or concerns.

## 2018-10-08 ENCOUNTER — Other Ambulatory Visit: Payer: Self-pay | Admitting: Cardiology

## 2018-10-08 ENCOUNTER — Encounter (HOSPITAL_BASED_OUTPATIENT_CLINIC_OR_DEPARTMENT_OTHER): Payer: PPO

## 2018-10-20 ENCOUNTER — Telehealth: Payer: Self-pay

## 2018-10-20 NOTE — Telephone Encounter (Signed)
   Cardiac Questionnaire:    Since your last visit or hospitalization:    1. Have you been having new or worsening chest pain? No   2. Have you been having new or worsening shortness of breath? No 3. Have you been having new or worsening leg swelling, wt gain, or increase in abdominal girth (pants fitting more tightly)? No   4. Have you had any passing out spells? No       Primary Cardiologist:  Buford Dresser, MD   Patient contacted.  History reviewed.  No symptoms to suggest any unstable cardiac conditions.  Based on discussion, with current pandemic situation, we will be postponing this appointment for Juan Montes with a plan for f/u in 3 months or sooner if feasible/necessary.  If symptoms change, he has been instructed to contact our office.   Meryl Crutch, RN  10/20/2018 11:12 AM         .

## 2018-10-22 ENCOUNTER — Ambulatory Visit: Payer: PPO | Admitting: Cardiology

## 2018-11-16 IMAGING — CT CT HEAD W/O CM
4 series · 15 of 47 positions shown, 17 images · non-contrast
Comparison: None.

CLINICAL DATA: Syncopal episode during dental procedure. Weakness
and fatigue for 1 month.

EXAM:
CT HEAD WITHOUT CONTRAST
TECHNIQUE: Contiguous axial images were obtained from the base of the skull
through the vertex without intravenous contrast.

[Series 4: head without · axial · non-contrast · 0.50mm/px · z∈[-98,+22]mm · 7 of 32 slices shown, 9 images]
[im 4/32  brain]
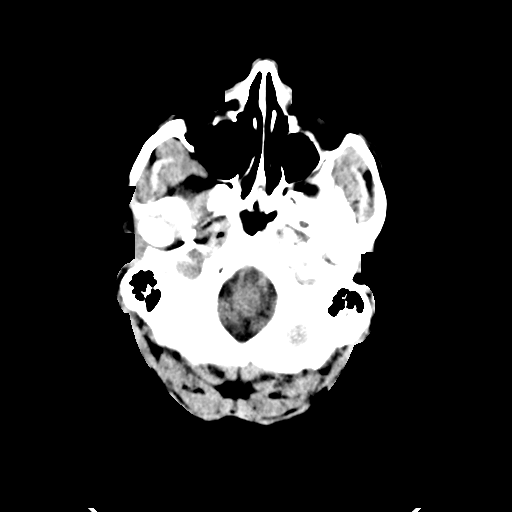
[im 4/32  bone]
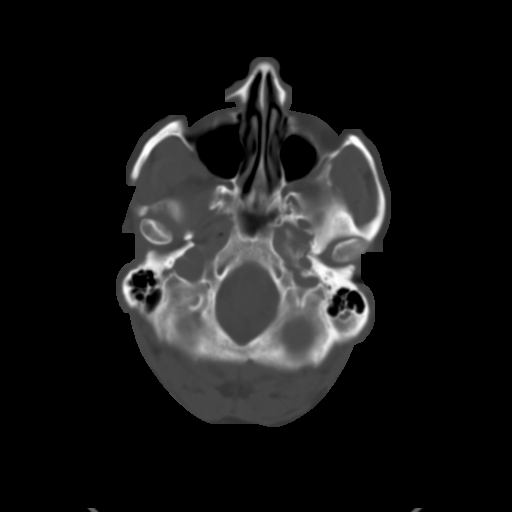
[im 8/32  brain]
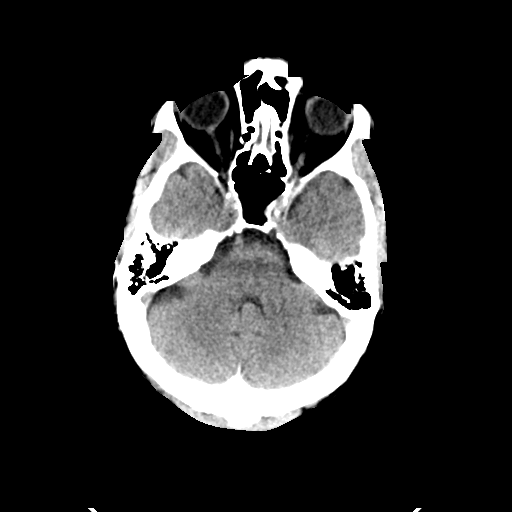
[im 12/32  brain]
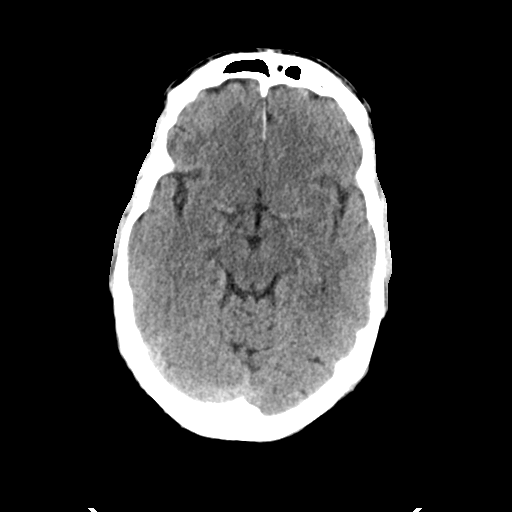
[im 16/32  brain]
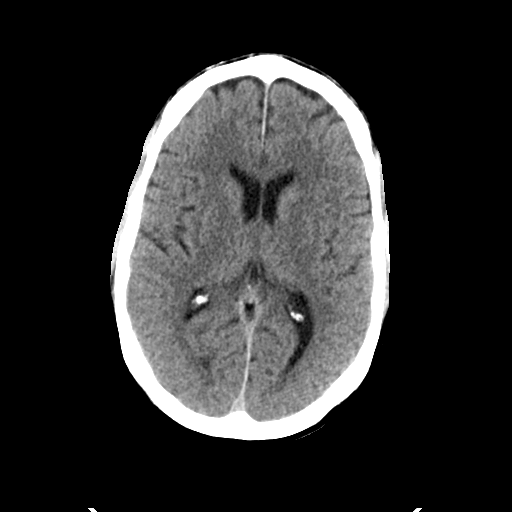
[im 20/32  brain]
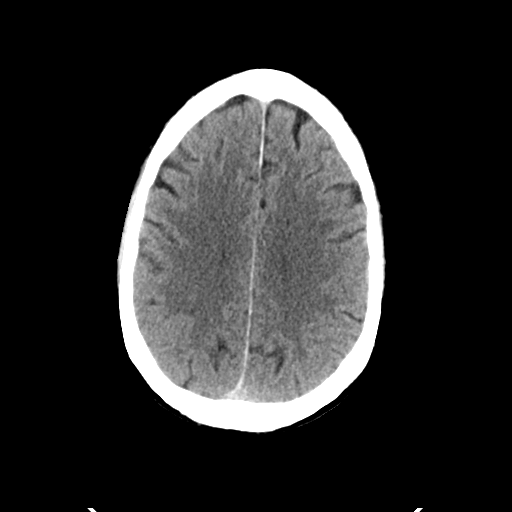
[im 20/32  bone]
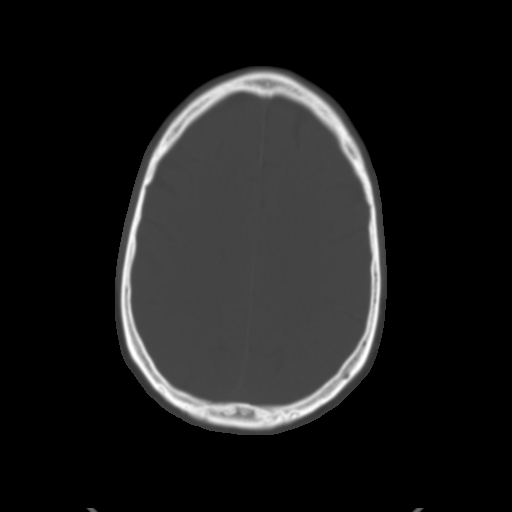
[im 24/32  brain]
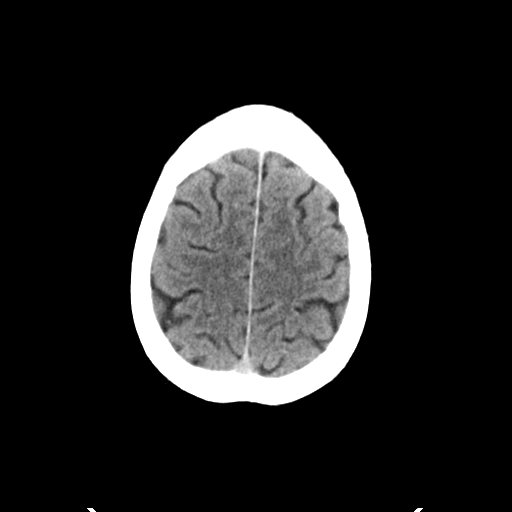
[im 28/32  brain]
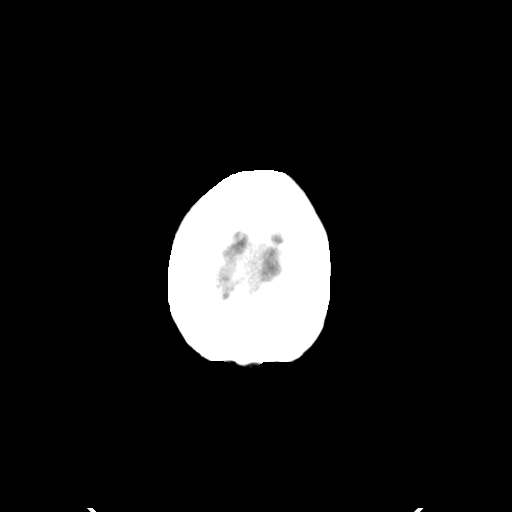

[Series 5: head bone · axial · 0.50mm/px · z∈[-98,-82]mm · 2 of 79 slices shown]
[im 8/79  bone]
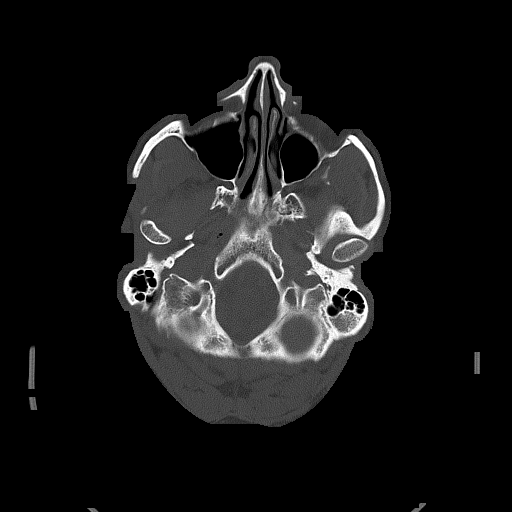
[im 16/79  bone]
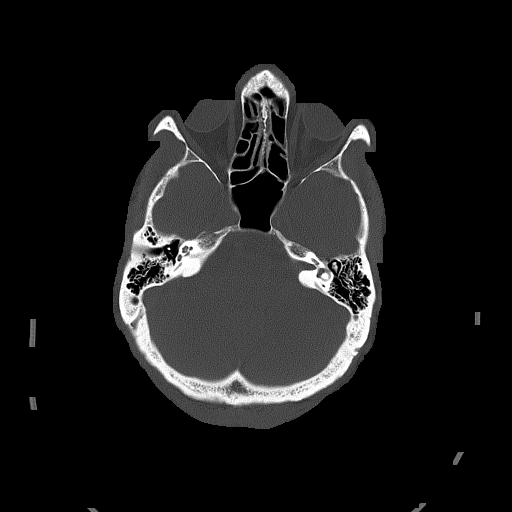

[Series 6: head without cor · coronal · non-contrast · 0.32mm/px · 3 of 69 slices shown]
[im 23/69  brain]
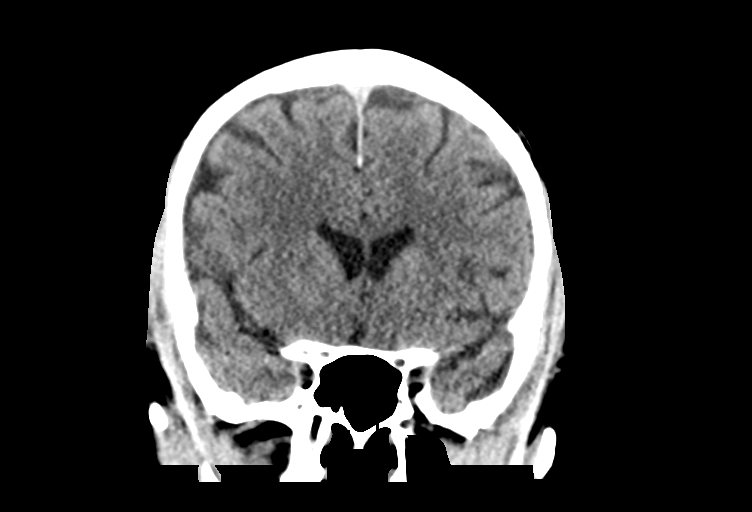
[im 31/69  brain]
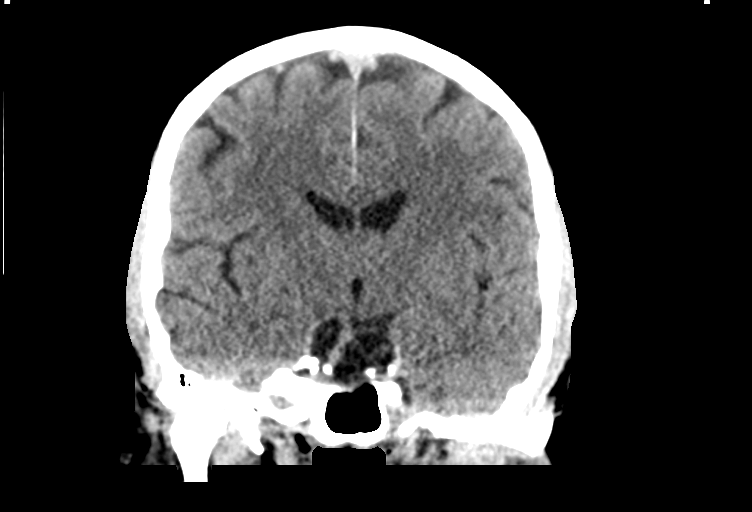
[im 38/69  brain]
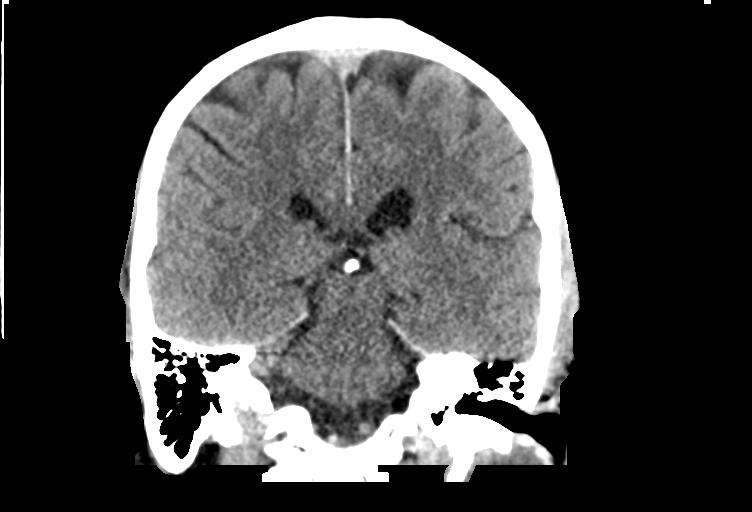

[Series 7: head without sag · sagittal · non-contrast · 0.32mm/px · 3 of 67 slices shown]
[im 23/67  brain]
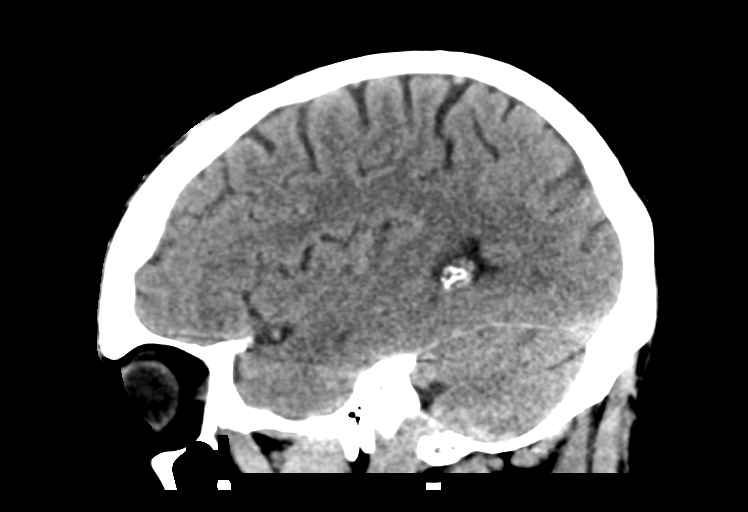
[im 34/67  brain]
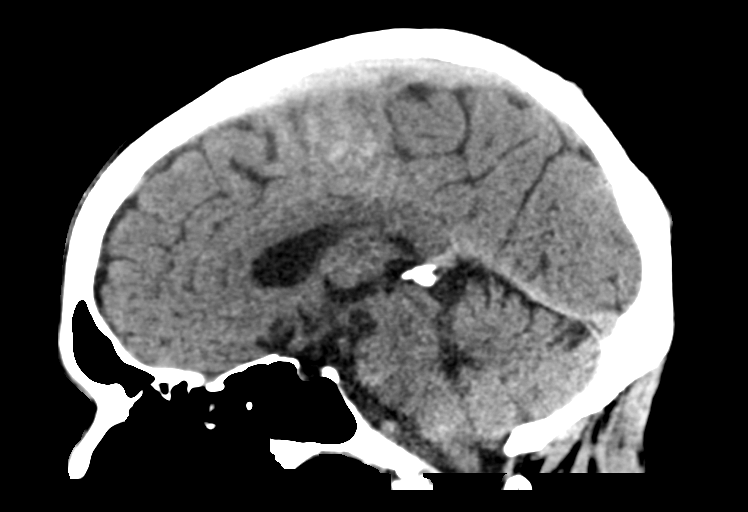
[im 45/67  brain]
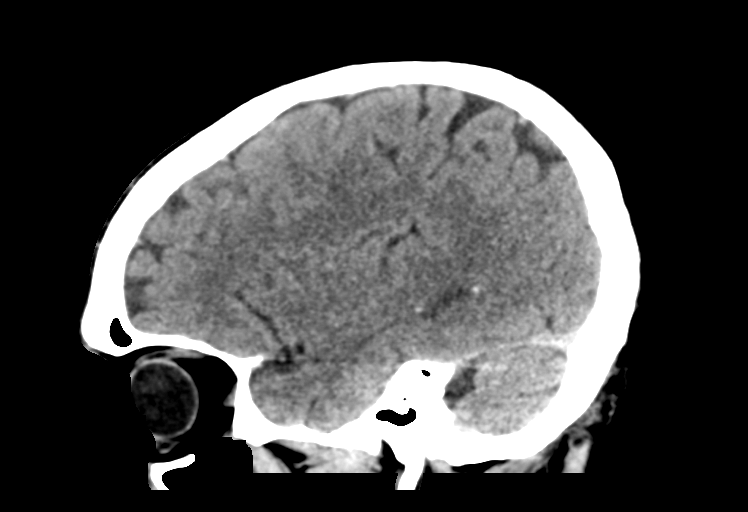

[15 of 47 positions shown; findings below may reference images not displayed]

FINDINGS: Brain: There is no evidence of acute intracranial hemorrhage, mass
lesion, brain edema or extra-axial fluid collection. The ventricles
and subarachnoid spaces are appropriately sized for age. There is no
CT evidence of acute cortical infarction.

Vascular: Intracranial vascular calcifications. No hyperdense vessel
identified.

Skull: Negative for fracture or focal lesion.

Sinuses/Orbits: The visualized paranasal sinuses and mastoid air
cells are clear. No orbital abnormalities are seen.

Other: None.
IMPRESSION: Negative noncontrast head CT.

## 2018-11-23 ENCOUNTER — Telehealth: Payer: Self-pay | Admitting: Cardiology

## 2018-11-23 NOTE — Telephone Encounter (Signed)
° °  Pt c/o BP issue: STAT if pt c/o blurred vision, one-sided weakness or slurred speech  1. What are your last 5 BP readings? 156/95  2. Are you having any other symptoms (ex. Dizziness, headache, blurred vision, passed out)? fatigue, headache  3. What is your BP issue? Patient states his BP is too high

## 2018-11-23 NOTE — Telephone Encounter (Addendum)
Spoke with patient and he has decreased energy and this is a change. This has gotten worse. He just feel like he "gives out" much sooner than he used to. His blood pressure is consistanly running in the 140's/90 and today 155/95. Patient denies any swelling or shortness of breath, does not weigh himself at home. Will forward to Dr [name] for review

## 2018-11-24 NOTE — Telephone Encounter (Signed)
Thank you. It would be helpful if he could start weighing himself, as fluid can sneak on before swelling or shortness of breath happens. He is on good medications, but if his blood pressure is consistently elevated we can make some changes. Juan Montes, can we get him a virtual visit in the next week or so with either myself or Jory Sims if I am full? Thank you!

## 2018-11-24 NOTE — Telephone Encounter (Signed)
Pt updated with Dr. Judeth Cornfield recommendation and voiced understanding. Virtual appointment has already been made for 12/01/18 at 4 pm with Dr. [name].

## 2018-11-30 ENCOUNTER — Telehealth: Payer: Self-pay | Admitting: Cardiology

## 2018-11-30 NOTE — Telephone Encounter (Signed)
Smartphone/ declined my chart/ consent/ pre reg completed °

## 2018-12-01 ENCOUNTER — Telehealth (INDEPENDENT_AMBULATORY_CARE_PROVIDER_SITE_OTHER): Payer: PPO | Admitting: Cardiology

## 2018-12-01 ENCOUNTER — Telehealth: Payer: Self-pay | Admitting: *Deleted

## 2018-12-01 VITALS — BP 136/83 | HR 79 | Ht 67.0 in | Wt 192.0 lb

## 2018-12-01 DIAGNOSIS — E7800 Pure hypercholesterolemia, unspecified: Secondary | ICD-10-CM | POA: Diagnosis not present

## 2018-12-01 DIAGNOSIS — I10 Essential (primary) hypertension: Secondary | ICD-10-CM

## 2018-12-01 DIAGNOSIS — I5042 Chronic combined systolic (congestive) and diastolic (congestive) heart failure: Secondary | ICD-10-CM

## 2018-12-01 DIAGNOSIS — Z7189 Other specified counseling: Secondary | ICD-10-CM

## 2018-12-01 NOTE — Telephone Encounter (Signed)
Virtual Visit Pre-Appointment Phone Call  "(Name), I am calling you today to discuss your upcoming appointment. We are currently trying to limit exposure to the virus that causes COVID-19 by seeing patients at home rather than in the office."  1. "What is the BEST phone number to call the day of the visit?" - include this in appointment notes  2. Do you have or have access to (through a family member/friend) a smartphone with video capability that we can use for your visit?" a. If yes - list this number in appt notes as cell (if different from BEST phone #) and list the appointment type as a VIDEO visit in appointment notes b. If no - list the appointment type as a PHONE visit in appointment notes  3. Confirm consent - "In the setting of the current Covid19 crisis, you are scheduled for a (phone or video) visit with your provider on (date) at (time).  Just as we do with many in-office visits, in order for you to participate in this visit, we must obtain consent.  If you'd like, I can send this to your mychart (if signed up) or email for you to review.  Otherwise, I can obtain your verbal consent now.  All virtual visits are billed to your insurance company just like a normal visit would be.  By agreeing to a virtual visit, we'd like you to understand that the technology does not allow for your provider to perform an examination, and thus may limit your provider's ability to fully assess your condition. If your provider identifies any concerns that need to be evaluated in person, we will make arrangements to do so.  Finally, though the technology is pretty good, we cannot assure that it will always work on either your or our end, and in the setting of a video visit, we may have to convert it to a phone-only visit.  In either situation, we cannot ensure that we have a secure connection.  Are you willing to proceed?" STAFF: Did the patient verbally acknowledge consent to telehealth visit? Document  YES/NO here: YES  4. Advise patient to be prepared - "Two hours prior to your appointment, go ahead and check your blood pressure, pulse, oxygen saturation, and your weight (if you have the equipment to check those) and write them all down. When your visit starts, your provider will ask you for this information. If you have an Apple Watch or Kardia device, please plan to have heart rate information ready on the day of your appointment. Please have a pen and paper handy nearby the day of the visit as well."  5. Give patient instructions for MyChart download to smartphone OR Doximity/Doxy.me as below if video visit (depending on what platform provider is using)  6. Inform patient they will receive a phone call 15 minutes prior to their appointment time (may be from unknown caller ID) so they should be prepared to answer    TELEPHONE CALL NOTE  Juan Montes has been deemed a candidate for a follow-up tele-health visit to limit community exposure during the Covid-19 pandemic. I spoke with the patient via phone to ensure availability of phone/video source, confirm preferred email & phone number, and discuss instructions and expectations.  I reminded Juan Montes to be prepared with any vital sign and/or heart rhythm information that could potentially be obtained via home monitoring, at the time of his visit. I reminded Juan Montes to expect a phone call prior to  his visit.  Venetia Maxon, [place] 12/01/2018 2:41 PM   INSTRUCTIONS FOR DOWNLOADING THE MYCHART APP TO SMARTPHONE  - The patient must first make sure to have activated MyChart and know their login information - If Apple, go to [employer] and type in MyChart in the search bar and download the app. If Android, ask patient to go to [employer] and type in [place] in the search bar and download the app. The app is free but as with any other app downloads, their phone may require them to verify saved payment information or  Apple/Android password.  - The patient will need to then log into the app with their MyChart username and password, and select [facility] as their healthcare provider to link the account. When it is time for your visit, go to the MyChart app, find appointments, and click Begin Video Visit. Be sure to Select Allow for your device to access the Microphone and Camera for your visit. You will then be connected, and your provider will be with you shortly.  **If they have any issues connecting, or need assistance please contact MyChart service desk [phone])**  **If using a computer, in order to ensure the best quality for their visit they will need to use either of the following Internet Browsers: [employer], or Google Chrome**  IF USING DOXIMITY or DOXY.ME - The patient will receive a link just prior to their visit by text.     FULL LENGTH CONSENT FOR TELE-HEALTH VISIT   I hereby voluntarily request, consent and authorize [facility] and its employed or contracted physicians, physician assistants, nurse practitioners or other licensed health care professionals (the Practitioner), to provide me with telemedicine health care services (the Services") as deemed necessary by the treating Practitioner. I acknowledge and consent to receive the Services by the Practitioner via telemedicine. I understand that the telemedicine visit will involve communicating with the Practitioner through live audiovisual communication technology and the disclosure of certain medical information by electronic transmission. I acknowledge that I have been given the opportunity to request an in-person assessment or other available alternative prior to the telemedicine visit and am voluntarily participating in the telemedicine visit.  I understand that I have the right to withhold or withdraw my consent to the use of telemedicine in the course of my care at any time, without affecting my right to future care  or treatment, and that the Practitioner or I may terminate the telemedicine visit at any time. I understand that I have the right to inspect all information obtained and/or recorded in the course of the telemedicine visit and may receive copies of available information for a reasonable fee.  I understand that some of the potential risks of receiving the Services via telemedicine include:   Delay or interruption in medical evaluation due to technological equipment failure or disruption;  Information transmitted may not be sufficient (e.g. poor resolution of images) to allow for appropriate medical decision making by the Practitioner; and/or   In rare instances, security protocols could fail, causing a breach of personal health information.  Furthermore, I acknowledge that it is my responsibility to provide information about my medical history, conditions and care that is complete and accurate to the best of my ability. I acknowledge that Practitioner's advice, recommendations, and/or decision may be based on factors not within their control, such as incomplete or inaccurate data provided by me or distortions of diagnostic images or specimens that may result from electronic transmissions.  I understand that the practice of medicine is not an exact science and that Practitioner makes no warranties or guarantees regarding treatment outcomes. I acknowledge that I will receive a copy of this consent concurrently upon execution via email to the email address I last provided but may also request a printed copy by calling the office of [facility].    I understand that my insurance will be billed for this visit.   I have read or had this consent read to me.  I understand the contents of this consent, which adequately explains the benefits and risks of the Services being provided via telemedicine.   I have been provided ample opportunity to ask questions regarding this consent and the Services and have had  my questions answered to my satisfaction.  I give my informed consent for the services to be provided through the use of telemedicine in my medical care  By participating in this telemedicine visit I agree to the above.       Cardiac Questionnaire:    Since your last visit or hospitalization:    1. Have you been having new or worsening chest pain? NO   2. Have you been having new or worsening shortness of breath? NO 3. Have you been having new or worsening leg swelling, wt gain, or increase in abdominal girth (pants fitting more tightly)? NO   4. Have you had any passing out spells? NO    *A YES to any of these questions would result in the appointment being kept. *If all the answers to these questions are NO, we should indicate that given the current situation regarding the worldwide coronarvirus pandemic, at the recommendation of the CDC, we are looking to limit gatherings in our waiting area, and thus will reschedule their appointment beyond four weeks from today.   _____________   COVID-19 Pre-Screening Questions:   Do you currently have a fever? NO  Have you recently travelled on a cruise, internationally, or to [place], [place], [place], [place], [place], or [place], [place] [employer]) ? NO  Have you been in contact with someone that is currently pending confirmation of Covid19 testing or has been confirmed to have the [place] virus?  NO  Are you currently experiencing fatigue or cough? NO

## 2018-12-01 NOTE — Patient Instructions (Signed)
Medication Instructions:  Your Physician recommend you continue on your current medication as directed.    If you need a refill on your cardiac medications before your next appointment, please call your pharmacy.   Lab work: None  Testing/Procedures: Your physician has requested that you have an echocardiogram in 3 months. Echocardiography is a painless test that uses sound waves to create images of your heart. It provides your doctor with information about the size and shape of your heart and how well your heart's chambers and valves are working. This procedure takes approximately one hour. There are no restrictions for this procedure. [place] [address]   Follow-Up: At [facility], you and your health needs are our priority.  As part of our continuing mission to provide you with exceptional heart care, we have created designated Provider Care Teams.  These Care Teams include your primary Cardiologist (physician) and Advanced Practice Providers (APPs -  Physician Assistants and Nurse Practitioners) who all work together to provide you with the care you need, when you need it. You will need a follow up appointment in 3 months.  Please call our office 2 months in advance to schedule this appointment.  You may see Buford Dresser, MD or one of the following Advanced Practice Providers on your designated Care Team:   Rosaria Ferries, PA-C . Jory Sims, DNP, ANP

## 2018-12-01 NOTE — Progress Notes (Signed)
Virtual Visit via Video Note   This visit type was conducted due to national recommendations for restrictions regarding the COVID-19 Pandemic (e.g. social distancing) in an effort to limit this patient's exposure and mitigate transmission in our community.  Due to his co-morbid illnesses, this patient is at least at moderate risk for complications without adequate follow up.  This format is felt to be most appropriate for this patient at this time.  All issues noted in this document were discussed and addressed.  A limited physical exam was performed with this format.  Please refer to the patient's chart for his consent to telehealth for [facility].   Date:  12/01/2018   ID:  Juan Montes, DOB 1951/06/11, MRN [id number]  Patient Location: Home Provider Location: Home  PCP:  Crist Infante, MD  Cardiologist:  Buford Dresser, MD  Electrophysiologist:  None   Evaluation Performed:  Follow-Up Visit  Chief Complaint:  Follow up  History of Present Illness:    Juan Montes is a 68 y.o. male with chronic systolic heart failure, HTN, HLD, atrial fibrillation, prior LV thrombus with resolution (2015).  The patient does not have symptoms concerning for COVID-19 infection (fever, chills, cough, or new shortness of breath).   Patient called 4/27, noted decreased energy, elevated BP. Seen today for these concerns.  He notes that he felt terrible last week. His BP was above his normal, was 156/100 at the highest. He had no energy, and it took significant effort to do anything. Denies chest pain, shortness of breath at rest or with normal exertion. No PND, orthopnea, LE edema or unexpected weight gain. No syncope or palpitations. Weights stable at 191 lb. No fevers/chills or infectious symptoms.   Feels much better today. He is not sure what changed. As fast as he felt poorly last week, he then felt better. Back to baseline. He chronically feels sluggish first thing in the morning, but feels  good by mid day. Hr in the 59s -37s. Tolerating all medications.  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     Allergies:   Celebrex [celecoxib]   Social History   Tobacco Use   Smoking status: Former Smoker   Smokeless tobacco: Never Used   Tobacco comment: Quit about 3-4 months ago the occasional cigar  Substance Use Topics   Alcohol use: No   Drug use: No     Family Hx: [name] includes Heart attack (age of onset: 28) in his brother; Hypertension in his brother and father; Kidney Stones in his mother.  ROS:   Please see the history of present illness.    Constitutional: Negative for chills, fever, night sweats, unintentional weight loss  HENT:  Negative for ear pain and hearing loss.   Eyes: Negative for loss of vision and eye pain.  Respiratory: Negative for cough, sputum, wheezing.   Cardiovascular: See HPI. Gastrointestinal: Negative for abdominal pain, melena, and hematochezia.  Genitourinary: Negative for dysuria and hematuria.  Musculoskeletal: Negative for falls and myalgias.  Skin: Negative for itching and rash.  Neurological: Negative for focal  weakness, focal sensory changes and loss of consciousness.  Endo/Heme/Allergies: Does not bruise/bleed easily.  All other systems reviewed and are negative.   Prior CV studies:   The following studies were reviewed today: Prior echo, cath, stress test  Labs/Other Tests and Data Reviewed:    EKG:  An ECG dated 04/13/18 was personally reviewed today and demonstrated:  NSR  Recent Labs: 04/13/2018: Hemoglobin 14.6; Platelets 224 09/21/2018: BUN 13; Creatinine, Ser 1.11; Potassium 4.5; Sodium 142   Recent Lipid Panel Lab Results  Component Value Date/Time   CHOL 211 (H) 09/07/2018 12:42 PM   TRIG 147 09/07/2018 12:42 PM   HDL 62 09/07/2018 12:42 PM   CHOLHDL 3.4 09/07/2018 12:42 PM   LDLCALC 120 (H) 09/07/2018 12:42 PM    Wt Readings from Last 3 Encounters:  09/10/18 197 lb (89.4 kg)  08/27/18 199 lb 6.4 oz (90.4 kg)  08/19/18 199 lb 9.6 oz (90.5 kg)     Objective:    Vital Signs:  BP 136/83    Pulse 79    Ht 5\' 7"  (1.702 m)    Wt 192 lb (87.1 kg)    BMI 30.07 kg/m    VITAL SIGNS:  reviewed GEN:  no acute distress EYES:  sclerae anicteric, EOMI - Extraocular Movements Intact RESPIRATORY:  normal respiratory effort, symmetric expansion CARDIOVASCULAR:  no visible JVD SKIN:  no rash, lesions or ulcers. MUSCULOSKELETAL:  no obvious deformities. NEURO:  alert and oriented x 3, no obvious focal deficit PSYCH:  normal affect  ASSESSMENT & PLAN:    1. 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 NYHA class 1-2.             -continue metoprolol succinate 100 mg daily  -continue entresto 97-103 mg BID             -rechecked echocardiogram 08/11/18, EF down to 30-35%. Has been on GDMT, will recheck echo prior to next visit.   -unclear what cause of his feeling poor was last week, but no weight gain, no edema, no SOB/PND/orthopnea and now self resolved. Instructed to let us know if symptoms return.  2. Hypertension: well controlled  today. On max entresto. Continue metoprolol as above.  3. History of atrial fibrillation: diagnosed 4.5 years ago after an event at work. Paroxysmal, has occurred with stress/dental procedures in the past. He can tell when he is in afib generally.  -CHA2DS2/VAS Stroke Risk Points=3  -was on apixaban in the past for LV thrombus, but was not on blood thinner for atrial fibrillation -discuss DOAC at follow up or if symptoms recur   4. Snoring, afib, HTN, fatigue: referred for sleep study, order pending  5. Secondary prevention: -recommend heart healthy/Mediterranean diet, with whole grains, fruits, vegetable, fish, lean meats, nuts, and olive oil. Limit salt. -recommend moderate walking, 3-5 times/week for 30-50 minutes each session. Aim for at least 150 minutes.week. Goal should be pace of 3 miles/hours, or walking 1.5 miles in 30 minutes -recommend avoidance of tobacco products. Avoid excess alcohol. -Additional risk factor control:             -  Diabetes: A1c is 5.0%, no history             -Lipids: LDL 131 in 01/2018. Checked 08/2018, LDL 120 Goal < 70 given nonobstructive CAD. Discuss statin at next visit             -Blood pressure control: as goal, above             -Weight: BMI 30  COVID-19 Education: The signs and symptoms of COVID-19 were discussed with the patient and how to seek care for testing (follow up with PCP or arrange E-visit).  The importance of social distancing was discussed today.  Time:   Today, I have spent 28 minutes with the patient with telehealth technology discussing the above problems.    Patient Instructions  Medication Instructions:  Your Physician recommend you continue on your current medication as directed.    If you need a refill on your cardiac medications before your next appointment, please call your pharmacy.   Lab work: None  Testing/Procedures: Your physician has requested that you have an echocardiogram in 3 months. Echocardiography is a  painless test that uses sound waves to create images of your heart. It provides your doctor with information about the size and shape of your heart and how well your hearts chambers and valves are working. This procedure takes approximately one hour. There are no restrictions for this procedure. [place] [address]   Follow-Up: At [facility], you and your health needs are our priority.  As part of our continuing mission to provide you with exceptional heart care, we have created designated Provider Care Teams.  These Care Teams include your primary Cardiologist (physician) and Advanced Practice Providers (APPs -  Physician Assistants and Nurse Practitioners) who all work together to provide you with the care you need, when you need it. You will need a follow up appointment in 3 months.  Please call our office 2 months in advance to schedule this appointment.  You may see Buford Dresser, MD or one of the following Advanced Practice Providers on your designated Care Team:   Rosaria Ferries, PA-C  Jory Sims, DNP, ANP       Medication Adjustments/Labs and Tests Ordered: Current medicines are reviewed at length with the patient today.  Concerns regarding medicines are outlined above.   Tests Ordered: Orders Placed This Encounter  Procedures   ECHOCARDIOGRAM COMPLETE    Medication Changes: No orders of the defined types were placed in this encounter.   Disposition:  Follow up in 3 mos, with echo prior to check EF response to medical therapy.  Signed, Buford Dresser, MD  12/01/2018  [facility]

## 2018-12-04 ENCOUNTER — Encounter: Payer: Self-pay | Admitting: Cardiology

## 2019-01-20 DIAGNOSIS — L820 Inflamed seborrheic keratosis: Secondary | ICD-10-CM | POA: Diagnosis not present

## 2019-01-20 DIAGNOSIS — D225 Melanocytic nevi of trunk: Secondary | ICD-10-CM | POA: Diagnosis not present

## 2019-01-20 DIAGNOSIS — D485 Neoplasm of uncertain behavior of skin: Secondary | ICD-10-CM | POA: Diagnosis not present

## 2019-01-20 DIAGNOSIS — X32XXXA Exposure to sunlight, initial encounter: Secondary | ICD-10-CM | POA: Diagnosis not present

## 2019-01-20 DIAGNOSIS — L570 Actinic keratosis: Secondary | ICD-10-CM | POA: Diagnosis not present

## 2019-01-21 DIAGNOSIS — I10 Essential (primary) hypertension: Secondary | ICD-10-CM | POA: Diagnosis not present

## 2019-01-21 DIAGNOSIS — I5020 Unspecified systolic (congestive) heart failure: Secondary | ICD-10-CM | POA: Diagnosis not present

## 2019-01-21 DIAGNOSIS — I4891 Unspecified atrial fibrillation: Secondary | ICD-10-CM | POA: Diagnosis not present

## 2019-01-22 ENCOUNTER — Other Ambulatory Visit: Payer: Self-pay

## 2019-01-22 ENCOUNTER — Encounter (HOSPITAL_COMMUNITY): Payer: Self-pay | Admitting: Physician Assistant

## 2019-01-22 ENCOUNTER — Ambulatory Visit (HOSPITAL_COMMUNITY)
Admission: RE | Admit: 2019-01-22 | Discharge: 2019-01-22 | Disposition: A | Discharge status: Home / Self Care | Payer: PPO | Source: Ambulatory Visit | Attending: Physician Assistant | Admitting: Physician Assistant

## 2019-01-22 VITALS — BP 112/72 | HR 87 | Ht 67.0 in | Wt 196.0 lb

## 2019-01-22 DIAGNOSIS — E669 Obesity, unspecified: Secondary | ICD-10-CM | POA: Diagnosis not present

## 2019-01-22 DIAGNOSIS — Z6830 Body mass index (BMI) 30.0-30.9, adult: Secondary | ICD-10-CM | POA: Diagnosis not present

## 2019-01-22 DIAGNOSIS — I5042 Chronic combined systolic (congestive) and diastolic (congestive) heart failure: Secondary | ICD-10-CM | POA: Insufficient documentation

## 2019-01-22 DIAGNOSIS — I480 Paroxysmal atrial fibrillation: Secondary | ICD-10-CM | POA: Diagnosis not present

## 2019-01-22 DIAGNOSIS — I2510 Atherosclerotic heart disease of native coronary artery without angina pectoris: Secondary | ICD-10-CM | POA: Diagnosis not present

## 2019-01-22 DIAGNOSIS — I110 Hypertensive heart disease with heart failure: Secondary | ICD-10-CM | POA: Insufficient documentation

## 2019-01-22 DIAGNOSIS — R400 Somnolence: Secondary | ICD-10-CM | POA: Diagnosis not present

## 2019-01-22 DIAGNOSIS — R0683 Snoring: Secondary | ICD-10-CM | POA: Insufficient documentation

## 2019-01-22 DIAGNOSIS — E785 Hyperlipidemia, unspecified: Secondary | ICD-10-CM | POA: Insufficient documentation

## 2019-01-22 DIAGNOSIS — F419 Anxiety disorder, unspecified: Secondary | ICD-10-CM | POA: Diagnosis not present

## 2019-01-22 DIAGNOSIS — Z87891 Personal history of nicotine dependence: Secondary | ICD-10-CM | POA: Insufficient documentation

## 2019-01-22 DIAGNOSIS — Z8249 Family history of ischemic heart disease and other diseases of the circulatory system: Secondary | ICD-10-CM | POA: Insufficient documentation

## 2019-01-22 DIAGNOSIS — Z86718 Personal history of other venous thrombosis and embolism: Secondary | ICD-10-CM | POA: Insufficient documentation

## 2019-01-22 DIAGNOSIS — Z7901 Long term (current) use of anticoagulants: Secondary | ICD-10-CM | POA: Insufficient documentation

## 2019-01-22 DIAGNOSIS — Z79899 Other long term (current) drug therapy: Secondary | ICD-10-CM | POA: Insufficient documentation

## 2019-01-22 NOTE — Progress Notes (Signed)
Primary Care Physician: Crist Infante, MD Primary Cardiologist: Dr [name] Primary Electrophysiologist: none Referring Physician: Dr Caren Hazy is a 69 y.o. male with a history of chronic systolic heart failure, HTN, HLD, prior LV thrombus with resolution (2015) and paroxysmal atrial fibrillation who presents for consultation in the [facility].  The patient was initially diagnosed with atrial fibrillation about five years ago at a work event and appeared to be related to stress/dental procedures. Patient was recently at his PCP and found to be in rate controlled afib along with symptoms of fatigue and chest discomfort. He was restarted on Eliquis on 6/25. Patient admits that for the last week or so he had been having these symptoms in addition to heart racing. He admits to snoring and daytime somnolence and there is a sleep study pending. He denies significant alcohol use.  Today, he denies symptoms of chest pain, shortness of breath, orthopnea, PND, lower extremity edema, dizziness, presyncope, syncope, snoring, daytime somnolence, bleeding, or neurologic sequela. The patient is tolerating medications without difficulties and is otherwise without complaint today.    Atrial Fibrillation Risk Factors:  he does have symptoms of sleep apnea. Sleep study pending. he does not have a history of rheumatic fever. he does have a history of alcohol use. The patient does not have a history of early familial atrial fibrillation or other arrhythmias.  he has a BMI of Body mass index is 30.7 kg/m.Marland Kitchen Filed Weights   01/22/19 1026  Weight: 88.9 kg    Family History  Problem Relation Age of Onset  . Kidney Stones Mother   . Hypertension Father   . Heart attack Brother 45       had CABG  . Hypertension Brother      Atrial Fibrillation Management history:  Previous antiarrhythmic drugs: none Previous cardioversions: none Previous ablations: none  CHADS2VASC score: 4 Anticoagulation history: Eliquis   Past Medical History:  Diagnosis Date  . A-fib ([place]) 09/20/2013  . Anxiety disorder 12/19/2009  . Arthritis of knee, degenerative 12/19/2009  . BP (high blood pressure) 12/19/2009  . Clinical depression 12/19/2009  . Depression   . 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 Elevated fasting blood sugar 12/19/2009  . Fatigue 12/19/2009  . HLD (hyperlipidemia) 12/19/2009  . HTN (hypertension)    Past Surgical History:  Procedure Laterality Date  . LEFT HEART CATH AND CORONARY ANGIOGRAPHY N/A 04/15/2018   Procedure: LEFT HEART CATH AND CORONARY ANGIOGRAPHY;  Surgeon: Leonie Man, MD;  Location: [facility];  Service: Cardiovascular;  Laterality: N/A;    Current Outpatient Medications  Medication Sig Dispense Refill  . amLODipine (NORVASC) 5 MG tablet Take 1 tablet (5 mg total) by mouth daily. 90 tablet 3  . apixaban (ELIQUIS) 5 MG TABS tablet Take 5 mg by mouth 2 (two) times daily.    . calcium carbonate (TUMS) 500 MG chewable tablet Chew 1-2 tablets by mouth 3 (three) times daily as needed for indigestion or heartburn.     . Cholecalciferol (VITAMIN D3) 2000 units TABS Take 2,000 Units by mouth daily.    Marland Kitchen ENTRESTO 97-103 MG TAKE 1 TABLET BY MOUTH TWICE DAILY 60 tablet 6  . metoprolol succinate (TOPROL-XL) 50 MG 24 hr tablet Take 50 mg by mouth 2 (two) times a day. Take with or immediately following a meal.    . sertraline (ZOLOFT) 100 MG tablet Take 50 mg by  mouth daily.      No current facility-administered medications for this encounter.     Allergies  Allergen Reactions  . Celebrex [Celecoxib]     itchy    Social History   Socioeconomic History  . Marital status: Married    Spouse name: Not on file  . Number of children: Not on file  . Years of education: Not on file  . Highest education level: Not on file  Occupational History  . Not on file  Social Needs   . Financial resource strain: Not on file  . Food insecurity    Worry: Not on file    Inability: Not on file  . Transportation needs    Medical: Not on file    Non-medical: Not on file  Tobacco Use  . Smoking status: Former Research scientist (life sciences)  . Smokeless tobacco: Never Used  . Tobacco comment: Quit about 3-4 months ago the occasional cigar  Substance and Sexual Activity  . Alcohol use: No  . Drug use: No  . Sexual activity: Not on file  Lifestyle  . Physical activity    Days per week: Not on file    Minutes per session: Not on file  . Stress: Not on file  Relationships  . Social Herbalist on phone: Not on file    Gets together: Not on file    Attends religious service: Not on file    Active member of club or organization: Not on file    Attends meetings of clubs or organizations: Not on file    Relationship status: Not on file  . Intimate partner violence    Fear of current or ex partner: Not on file    Emotionally abused: Not on file    Physically abused: Not on file    Forced sexual activity: Not on file  Other Topics Concern  . Not on file  Social History Narrative  . Not on file     ROS- All systems are reviewed and negative except as per the HPI above.  Physical Exam: Vitals:   01/22/19 1026  Weight: 88.9 kg  Height: 5\' 7"  (1.702 m)    GEN- The patient is well appearing obese male, alert and oriented x 3 today.   Head- normocephalic, atraumatic Eyes-  Sclera clear, conjunctiva pink Ears- hearing intact Oropharynx- clear Neck- supple  Lungs- Clear to ausculation bilaterally, normal work of breathing Heart- irregular rate and rhythm, no murmurs, rubs or gallops  GI- soft, NT, ND, + BS Extremities- no clubbing, cyanosis, or edema MS- no significant deformity or atrophy Skin- no rash or lesion Psych- euthymic mood, full affect Neuro- strength and sensation are intact  Wt Readings from Last 3 Encounters:  01/22/19 88.9 kg  12/01/18 87.1 kg  09/10/18  89.4 kg    EKG today demonstrates afib HR 87, QRS 92, QTc 442  Echo 08/11/18 demonstrated  - Left ventricle: The cavity size was mildly dilated. There was   mild focal basal hypertrophy of the septum. Systolic function was   moderately to severely reduced. The estimated ejection fraction   was in the range of 30% to 35%. Diffuse hypokinesis. Doppler   parameters are consistent with abnormal left ventricular   relaxation (grade 1 diastolic dysfunction). - Aortic valve: There was trivial regurgitation. - Aortic root: The aortic root was mildly dilated. - Ascending aorta: The ascending aorta was mildly dilated.  Impressions:  - Moderate to severe global reduction in LV systolic function; mild  diastolic dysfunction; mild LVE; trace AI; mildly dilated aortic   root/ascending aorta. LA 46 mm  Epic records are reviewed at length today  Assessment and Plan:  1. Atrial fibrillation The patient has atrial fibrillation of unknown duration. Noted on ECG at PCP office but symptoms have been ongoing prior. General education about afib discussed and questions answered. Also discussed his stroke risk and the risks and benefits of anticoagulation.  TSH, K+, Mag at PCP office unremarkable. Cr 1.1, Hgb 15.5. Discussed therapeutic options including DCCV. Patient is going out of town soon and wants to postpone DCCV until after he returns. This will also give him time to be on anticoagulation for 3 weeks uninterrupted.  Continue Eliquis 5 mg BID Continue Toprol 50 mg BID, he is adequately rate controlled.  This patients CHA2DS2-VASc Score and unadjusted Ischemic Stroke Rate (% per year) is equal to 4.8 % stroke rate/year from a score of 4  Above score calculated as 1 point each if present [CHF, HTN, DM, Vascular=MI/PAD/Aortic Plaque, Age if 65-74, or Male] Above score calculated as 2 points each if present [Age > 75, or Stroke/TIA/TE]   2. Obesity Body mass index is 30.7 kg/m. Lifestyle  modification was discussed at length including regular exercise and weight reduction.  3. Snoring/daytime somnolence The importance of adequate treatment of sleep apnea was discussed today in order to improve our ability to maintain sinus rhythm long term. Sleep study is already pending.  4. Combined systolic and diastolic CHF EF 23-95% on last echo. No signs or symptoms of fluid overload. Continue Entresto and Toprol. Repeat echo scheduled.  5. HTN Stable, no change today.  6. CAD Non obstructive on [facility] 03/2018. No anginal symptoms. Continue present therapy and risk factor modification.    Follow up in AF clinic after he returns from vacation in one month.   [facility] [address] [place], [place] [address] [phone] 01/22/2019 10:29 AM

## 2019-02-01 ENCOUNTER — Ambulatory Visit: Payer: PPO | Admitting: Cardiology

## 2019-02-08 DIAGNOSIS — L988 Other specified disorders of the skin and subcutaneous tissue: Secondary | ICD-10-CM | POA: Diagnosis not present

## 2019-02-08 DIAGNOSIS — D485 Neoplasm of uncertain behavior of skin: Secondary | ICD-10-CM | POA: Diagnosis not present

## 2019-02-10 ENCOUNTER — Telehealth: Payer: Self-pay | Admitting: *Deleted

## 2019-02-10 NOTE — Telephone Encounter (Signed)
A message was left, re: rescheduling office visit.

## 2019-02-17 ENCOUNTER — Ambulatory Visit (HOSPITAL_COMMUNITY): Payer: PPO | Admitting: Physician Assistant

## 2019-02-25 ENCOUNTER — Other Ambulatory Visit: Payer: Self-pay

## 2019-02-25 ENCOUNTER — Ambulatory Visit (HOSPITAL_COMMUNITY)
Admission: RE | Admit: 2019-02-25 | Discharge: 2019-02-25 | Disposition: A | Discharge status: Home / Self Care | Payer: PPO | Source: Ambulatory Visit | Attending: Physician Assistant | Admitting: Physician Assistant

## 2019-02-25 VITALS — BP 118/72 | HR 64 | Ht 67.0 in | Wt 198.0 lb

## 2019-02-25 DIAGNOSIS — F418 Other specified anxiety disorders: Secondary | ICD-10-CM | POA: Diagnosis not present

## 2019-02-25 DIAGNOSIS — I480 Paroxysmal atrial fibrillation: Secondary | ICD-10-CM | POA: Insufficient documentation

## 2019-02-25 DIAGNOSIS — N529 Male erectile dysfunction, unspecified: Secondary | ICD-10-CM | POA: Insufficient documentation

## 2019-02-25 DIAGNOSIS — Z6831 Body mass index (BMI) 31.0-31.9, adult: Secondary | ICD-10-CM | POA: Diagnosis not present

## 2019-02-25 DIAGNOSIS — Z8249 Family history of ischemic heart disease and other diseases of the circulatory system: Secondary | ICD-10-CM | POA: Diagnosis not present

## 2019-02-25 DIAGNOSIS — M25551 Pain in right hip: Secondary | ICD-10-CM | POA: Diagnosis not present

## 2019-02-25 DIAGNOSIS — I5042 Chronic combined systolic (congestive) and diastolic (congestive) heart failure: Secondary | ICD-10-CM | POA: Insufficient documentation

## 2019-02-25 DIAGNOSIS — Z87891 Personal history of nicotine dependence: Secondary | ICD-10-CM | POA: Diagnosis not present

## 2019-02-25 DIAGNOSIS — I110 Hypertensive heart disease with heart failure: Secondary | ICD-10-CM | POA: Insufficient documentation

## 2019-02-25 DIAGNOSIS — I2510 Atherosclerotic heart disease of native coronary artery without angina pectoris: Secondary | ICD-10-CM | POA: Insufficient documentation

## 2019-02-25 DIAGNOSIS — E669 Obesity, unspecified: Secondary | ICD-10-CM | POA: Diagnosis not present

## 2019-02-25 DIAGNOSIS — Z86718 Personal history of other venous thrombosis and embolism: Secondary | ICD-10-CM | POA: Diagnosis not present

## 2019-02-25 DIAGNOSIS — I4891 Unspecified atrial fibrillation: Secondary | ICD-10-CM | POA: Diagnosis not present

## 2019-02-25 DIAGNOSIS — Z7901 Long term (current) use of anticoagulants: Secondary | ICD-10-CM | POA: Diagnosis not present

## 2019-02-25 DIAGNOSIS — Z79899 Other long term (current) drug therapy: Secondary | ICD-10-CM | POA: Insufficient documentation

## 2019-02-25 DIAGNOSIS — I5020 Unspecified systolic (congestive) heart failure: Secondary | ICD-10-CM | POA: Diagnosis not present

## 2019-02-25 DIAGNOSIS — R0683 Snoring: Secondary | ICD-10-CM | POA: Insufficient documentation

## 2019-02-25 DIAGNOSIS — E785 Hyperlipidemia, unspecified: Secondary | ICD-10-CM | POA: Diagnosis not present

## 2019-02-25 LAB — CBC
HCT: 42.8 % (ref 39.0–52.0)
Hemoglobin: 14.4 g/dL (ref 13.0–17.0)
MCH: 31.7 pg (ref 26.0–34.0)
MCHC: 33.6 g/dL (ref 30.0–36.0)
MCV: 94.3 fL (ref 80.0–100.0)
Platelets: 167 10*3/uL (ref 150–400)
RBC: 4.54 MIL/uL (ref 4.22–5.81)
RDW: 12 % (ref 11.5–15.5)
WBC: 3.7 10*3/uL — ABNORMAL LOW (ref 4.0–10.5)
nRBC: 0 % (ref 0.0–0.2)

## 2019-02-25 NOTE — Progress Notes (Signed)
Primary Care Physician: Crist Infante, MD Primary Cardiologist: Dr [name] Primary Electrophysiologist: none Referring Physician: Dr Caren Hazy is a 68 y.o. male with a history of chronic systolic heart failure, HTN, HLD, prior LV thrombus with resolution (2015) and paroxysmal atrial fibrillation who presents for consultation in the [facility].  The patient was initially diagnosed with atrial fibrillation about five years ago at a work event and appeared to be related to stress/dental procedures. Patient was recently at his PCP and found to be in rate controlled afib along with symptoms of fatigue and chest discomfort. He was restarted on Eliquis on 6/25. Patient admits that for the last week or so he had been having these symptoms in addition to heart racing. He admits to snoring and daytime somnolence and there is a sleep study pending. He denies significant alcohol use.  On follow up today, patient reports that he has been feeling very well since his last visit. He reports that he feels he went back into normal rhythm a couple days after his last visit. He is no longer having chest discomfort and his energy level has improved although he does still have daytime somnolence.   Today, he denies symptoms of palpitations, chest pain, shortness of breath, orthopnea, PND, lower extremity edema, dizziness, presyncope, syncope, snoring, daytime somnolence, bleeding, or neurologic sequela. The patient is tolerating medications without difficulties and is otherwise without complaint today.    Atrial Fibrillation Risk Factors:  he does have symptoms of sleep apnea. Sleep study pending. he does not have a history of rheumatic fever. he does have a history of alcohol use. The patient does not have a history of early familial atrial fibrillation or other arrhythmias.  he has a BMI of Body mass index is 31.01 kg/m.Marland Kitchen Filed Weights   02/25/19 1329  Weight:  89.8 kg    Family History  Problem Relation Age of Onset  . Kidney Stones Mother   . Hypertension Father   . Heart attack Brother 44       had CABG  . Hypertension Brother      Atrial Fibrillation Management history:  Previous antiarrhythmic drugs: none Previous cardioversions: none Previous ablations: none CHADS2VASC score: 4 Anticoagulation history: Eliquis   Past Medical History:  Diagnosis Date  . A-fib ([place]) 09/20/2013  . Anxiety disorder 12/19/2009  . Arthritis of knee, degenerative 12/19/2009  . BP (high blood pressure) 12/19/2009  . Clinical depression 12/19/2009  . Depression   . 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 Elevated fasting blood sugar 12/19/2009  . Fatigue 12/19/2009  . HLD (hyperlipidemia) 12/19/2009  . HTN (hypertension)    Past Surgical History:  Procedure Laterality Date  . LEFT HEART CATH AND CORONARY ANGIOGRAPHY N/A 04/15/2018   Procedure: LEFT HEART CATH AND CORONARY ANGIOGRAPHY;  Surgeon: Leonie Man, MD;  Location: [facility];  Service: Cardiovascular;  Laterality: N/A;    Current Outpatient Medications  Medication Sig Dispense Refill  . amLODipine (NORVASC) 5 MG tablet Take 1 tablet (5 mg total) by mouth daily. 90 tablet 3  . apixaban (ELIQUIS) 5 MG TABS tablet Take 5 mg by mouth 2 (two) times daily.    . calcium carbonate (TUMS) 500 MG chewable tablet Chew 1-2 tablets by mouth 3 (three) times daily as needed for indigestion or heartburn.     . Cholecalciferol (VITAMIN D3) 2000 units TABS Take 2,000 Units by mouth  daily.    Marland Kitchen ENTRESTO 97-103 MG TAKE 1 TABLET BY MOUTH TWICE DAILY 60 tablet 6  . metoprolol succinate (TOPROL-XL) 50 MG 24 hr tablet Take 50 mg by mouth 2 (two) times a day. Take with or immediately following a meal.    . sertraline (ZOLOFT) 100 MG tablet Take 50 mg by mouth daily.      No current facility-administered medications for this encounter.     Allergies   Allergen Reactions  . Celebrex [Celecoxib]     itchy    Social History   Socioeconomic History  . Marital status: Married    Spouse name: Not on file  . Number of children: Not on file  . Years of education: Not on file  . Highest education level: Not on file  Occupational History  . Not on file  Social Needs  . Financial resource strain: Not on file  . Food insecurity    Worry: Not on file    Inability: Not on file  . Transportation needs    Medical: Not on file    Non-medical: Not on file  Tobacco Use  . Smoking status: Former Research scientist (life sciences)  . Smokeless tobacco: Never Used  . Tobacco comment: Quit about 3-4 months ago the occasional cigar  Substance and Sexual Activity  . Alcohol use: No  . Drug use: No  . Sexual activity: Not on file  Lifestyle  . Physical activity    Days per week: Not on file    Minutes per session: Not on file  . Stress: Not on file  Relationships  . Social Herbalist on phone: Not on file    Gets together: Not on file    Attends religious service: Not on file    Active member of club or organization: Not on file    Attends meetings of clubs or organizations: Not on file    Relationship status: Not on file  . Intimate partner violence    Fear of current or ex partner: Not on file    Emotionally abused: Not on file    Physically abused: Not on file    Forced sexual activity: Not on file  Other Topics Concern  . Not on file  Social History Narrative  . Not on file     ROS- All systems are reviewed and negative except as per the HPI above.  Physical Exam: Vitals:   02/25/19 1329  BP: 118/72  Pulse: 64  Weight: 89.8 kg  Height: 5\' 7"  (1.702 m)    GEN- The patient is well appearing obese male, alert and oriented x 3 today.   HEENT-head normocephalic, atraumatic, sclera clear, conjunctiva pink, hearing intact, trachea midline. Lungs- Clear to ausculation bilaterally, normal work of breathing Heart- Regular rate and rhythm, no  murmurs, rubs or gallops  GI- soft, NT, ND, + BS Extremities- no clubbing, cyanosis, or edema MS- no significant deformity or atrophy Skin- no rash or lesion Psych- euthymic mood, full affect Neuro- strength and sensation are intact   Wt Readings from Last 3 Encounters:  02/25/19 89.8 kg  01/22/19 88.9 kg  12/01/18 87.1 kg    EKG today demonstrates SR HR 64, 1st degree AV block, PR 202, QRS 98, QTc 451  Echo 08/11/18 demonstrated  - Left ventricle: The cavity size was mildly dilated. There was   mild focal basal hypertrophy of the septum. Systolic function was   moderately to severely reduced. The estimated ejection fraction   was  in the range of 30% to 35%. Diffuse hypokinesis. Doppler   parameters are consistent with abnormal left ventricular   relaxation (grade 1 diastolic dysfunction). - Aortic valve: There was trivial regurgitation. - Aortic root: The aortic root was mildly dilated. - Ascending aorta: The ascending aorta was mildly dilated.  Impressions:  - Moderate to severe global reduction in LV systolic function; mild   diastolic dysfunction; mild LVE; trace AI; mildly dilated aortic   root/ascending aorta. LA 46 mm  Epic records are reviewed at length today  Assessment and Plan:  1. Paroxsymal atrial fibrillation Patient reports he went back into SR not long after his last appt. Will not plan on DCCV. Continue Eliquis 5 mg BID Continue Toprol 50 mg BID We discussed ADD therapy should his afib become more persistent.  Lifestyle changes as below.  This patients CHA2DS2-VASc Score and unadjusted Ischemic Stroke Rate (% per year) is equal to 4.8 % stroke rate/year from a score of 4  Above score calculated as 1 point each if present [CHF, HTN, DM, Vascular=MI/PAD/Aortic Plaque, Age if 65-74, or Male] Above score calculated as 2 points each if present [Age > 75, or Stroke/TIA/TE]   2. Obesity Body mass index is 31.01 kg/m. Lifestyle modification was  discussed and encouraged including regular physical activity and weight reduction. Patient does have hip pain which limits his activity. He is going to look into getting a recumbent stationary bike.  3. Snoring/daytime somnolence The importance of adequate treatment of sleep apnea was discussed today in order to improve our ability to maintain sinus rhythm long term. Sleep study appears to have been cancelled 2/2 COVID-19. Will reorder this to be done.  4. Combined systolic and diastolic CHF EF 08-14% on last echo. No signs or symptoms of fluid overload. Continue Entresto and Toprol. Repeat echo scheduled 8/6.  5. HTN Stable, no change today.  6. CAD Non obstructive on [facility] 03/2018. No anginal symptoms. Continue present therapy and risk factor modification.    Follow up with Dr [name] as scheduled. AF clinic in 4 months.   [facility] [address] [place], [place] [address] [phone] 02/25/2019 2:09 PM

## 2019-03-04 ENCOUNTER — Ambulatory Visit (HOSPITAL_COMMUNITY): Payer: PPO | Attending: Cardiology

## 2019-03-04 ENCOUNTER — Other Ambulatory Visit: Payer: Self-pay

## 2019-03-04 DIAGNOSIS — I5042 Chronic combined systolic (congestive) and diastolic (congestive) heart failure: Secondary | ICD-10-CM | POA: Diagnosis not present

## 2019-03-05 NOTE — Telephone Encounter (Signed)
Attempted to reply to patient's comments via mychart, but error message saying it is not available. We have an upcoming appt and can fully review his history and prior echoes at that time. Thanks. Dr. [name]

## 2019-03-11 ENCOUNTER — Telehealth: Payer: Self-pay | Admitting: *Deleted

## 2019-03-11 NOTE — Telephone Encounter (Signed)
PA request faxed to [employer] to get sleep study re approved.

## 2019-03-12 DIAGNOSIS — Z125 Encounter for screening for malignant neoplasm of prostate: Secondary | ICD-10-CM | POA: Diagnosis not present

## 2019-03-12 DIAGNOSIS — E7849 Other hyperlipidemia: Secondary | ICD-10-CM | POA: Diagnosis not present

## 2019-03-12 DIAGNOSIS — R7301 Impaired fasting glucose: Secondary | ICD-10-CM | POA: Diagnosis not present

## 2019-03-12 DIAGNOSIS — R82998 Other abnormal findings in urine: Secondary | ICD-10-CM | POA: Diagnosis not present

## 2019-03-15 ENCOUNTER — Ambulatory Visit: Payer: PPO | Admitting: Cardiology

## 2019-03-19 DIAGNOSIS — I4891 Unspecified atrial fibrillation: Secondary | ICD-10-CM | POA: Diagnosis not present

## 2019-03-19 DIAGNOSIS — Z0000 Encounter for general adult medical examination without abnormal findings: Secondary | ICD-10-CM | POA: Diagnosis not present

## 2019-03-19 DIAGNOSIS — I809 Phlebitis and thrombophlebitis of unspecified site: Secondary | ICD-10-CM | POA: Diagnosis not present

## 2019-03-19 DIAGNOSIS — M25551 Pain in right hip: Secondary | ICD-10-CM | POA: Diagnosis not present

## 2019-03-19 DIAGNOSIS — R5383 Other fatigue: Secondary | ICD-10-CM | POA: Diagnosis not present

## 2019-03-19 DIAGNOSIS — I2510 Atherosclerotic heart disease of native coronary artery without angina pectoris: Secondary | ICD-10-CM | POA: Diagnosis not present

## 2019-03-19 DIAGNOSIS — M79609 Pain in unspecified limb: Secondary | ICD-10-CM | POA: Diagnosis not present

## 2019-03-19 DIAGNOSIS — Z1331 Encounter for screening for depression: Secondary | ICD-10-CM | POA: Diagnosis not present

## 2019-03-19 DIAGNOSIS — F329 Major depressive disorder, single episode, unspecified: Secondary | ICD-10-CM | POA: Diagnosis not present

## 2019-03-19 DIAGNOSIS — I428 Other cardiomyopathies: Secondary | ICD-10-CM | POA: Diagnosis not present

## 2019-03-19 DIAGNOSIS — N529 Male erectile dysfunction, unspecified: Secondary | ICD-10-CM | POA: Diagnosis not present

## 2019-03-19 DIAGNOSIS — F419 Anxiety disorder, unspecified: Secondary | ICD-10-CM | POA: Diagnosis not present

## 2019-03-19 DIAGNOSIS — I5020 Unspecified systolic (congestive) heart failure: Secondary | ICD-10-CM | POA: Diagnosis not present

## 2019-03-25 ENCOUNTER — Telehealth: Payer: Self-pay | Admitting: *Deleted

## 2019-03-25 NOTE — Telephone Encounter (Signed)
Called patient to give sleep study appointment details. He asked that I call his wife because he could not remember "all these dates." I then called wife at number provided. Had to leave information on VM.

## 2019-03-27 ENCOUNTER — Other Ambulatory Visit (HOSPITAL_COMMUNITY)
Admission: RE | Admit: 2019-03-27 | Discharge: 2019-03-27 | Disposition: A | Discharge status: Home / Self Care | Payer: PPO | Source: Ambulatory Visit | Attending: Cardiovascular Disease | Admitting: Cardiovascular Disease

## 2019-03-27 DIAGNOSIS — Z01812 Encounter for preprocedural laboratory examination: Secondary | ICD-10-CM | POA: Insufficient documentation

## 2019-03-27 DIAGNOSIS — Z20828 Contact with and (suspected) exposure to other viral communicable diseases: Secondary | ICD-10-CM | POA: Insufficient documentation

## 2019-03-27 LAB — SARS CORONAVIRUS 2 (TAT 6-24 HRS): SARS Coronavirus 2: NEGATIVE

## 2019-03-30 ENCOUNTER — Encounter: Payer: Self-pay | Admitting: Cardiology

## 2019-03-30 ENCOUNTER — Ambulatory Visit (HOSPITAL_BASED_OUTPATIENT_CLINIC_OR_DEPARTMENT_OTHER): Payer: PPO | Attending: Cardiology | Admitting: Cardiovascular Disease

## 2019-03-30 ENCOUNTER — Other Ambulatory Visit: Payer: Self-pay

## 2019-03-30 ENCOUNTER — Ambulatory Visit (INDEPENDENT_AMBULATORY_CARE_PROVIDER_SITE_OTHER): Payer: PPO | Admitting: Cardiology

## 2019-03-30 VITALS — BP 122/68 | HR 63 | Ht 67.0 in | Wt 193.4 lb

## 2019-03-30 DIAGNOSIS — G4733 Obstructive sleep apnea (adult) (pediatric): Secondary | ICD-10-CM | POA: Diagnosis not present

## 2019-03-30 DIAGNOSIS — I480 Paroxysmal atrial fibrillation: Secondary | ICD-10-CM | POA: Diagnosis not present

## 2019-03-30 DIAGNOSIS — I493 Ventricular premature depolarization: Secondary | ICD-10-CM | POA: Diagnosis not present

## 2019-03-30 DIAGNOSIS — Z6830 Body mass index (BMI) 30.0-30.9, adult: Secondary | ICD-10-CM | POA: Insufficient documentation

## 2019-03-30 DIAGNOSIS — I5042 Chronic combined systolic (congestive) and diastolic (congestive) heart failure: Secondary | ICD-10-CM

## 2019-03-30 DIAGNOSIS — Z7901 Long term (current) use of anticoagulants: Secondary | ICD-10-CM | POA: Diagnosis not present

## 2019-03-30 DIAGNOSIS — Z7189 Other specified counseling: Secondary | ICD-10-CM

## 2019-03-30 DIAGNOSIS — R5383 Other fatigue: Secondary | ICD-10-CM | POA: Insufficient documentation

## 2019-03-30 DIAGNOSIS — E669 Obesity, unspecified: Secondary | ICD-10-CM | POA: Insufficient documentation

## 2019-03-30 DIAGNOSIS — R0683 Snoring: Secondary | ICD-10-CM | POA: Diagnosis not present

## 2019-03-30 DIAGNOSIS — I10 Essential (primary) hypertension: Secondary | ICD-10-CM

## 2019-03-30 DIAGNOSIS — I428 Other cardiomyopathies: Secondary | ICD-10-CM

## 2019-03-30 DIAGNOSIS — Z79899 Other long term (current) drug therapy: Secondary | ICD-10-CM | POA: Diagnosis not present

## 2019-03-30 NOTE — Patient Instructions (Signed)

## 2019-03-30 NOTE — Progress Notes (Signed)
Cardiology Office Note:    Date:  03/30/2019   ID:  [name], DOB [birth year], MRN [id number]  PCP:  Crist Infante, MD  Cardiologist:  Buford Dresser, MD PhD  Referring MD: Crist Infante, MD   CC: follow up of echo  History of Present Illness:    NARCISO UTT is a 68 y.o. male with a hx of HTN, HLD, atrial fibrillation, prior LV thrombus with resolution (2015),  who is seen for the evaluation and management of his cardiac conditions. He was last seen by Rosaria Ferries on 05/14/18 and previously by Dr. Johnsie Cancel in 2015.  Patient concerns today:  BP on home machine has been consistently high. 137/98 on his machine at the same time as ours was 122/68. This may explain why his Bps at home have been upper 130s-140s.   Feeling like he doesn't have a lot of energy. Has been mostly sedentary. Did some yard work over the weekend and felt very wiped out.   He is tolerating all medications well, no issues or concerns.  We reviewed the results of his echo together. It is largely unchanged. EF remains 30-35%. We spent time today discussing ICD recommendations, as well as other devices (he was asking if he needs a pacemaker). Reviewed indications for PPM (he does not need), CRT (he does not meet criteria, narrow QRS), and ICD. We discussed what an ICD is and what it does. Discussed that it is designed to only fire an ICD shock when needed based on rhythm and can serve as a pacemaker when heart rate is low. I would not, in his case, expect it to be used with each beat (like CRT would) and therefore would not expect it to improve his EF or his energy level. A primary prevention ICD is for prevention of sudden cardiac death due to VT/VF. I did talk briefly about some dissension about primary prevention ICDs in NICM (ie the [place] trial) vs stronger recommendations in ischemic cardiomyopathy. We discussed this at length today, see below for summary.  Weight is stable. No significant edema. No chest  pain. His appointment for his sleep study is this evening. His COVID pre-test was negative.   Past Medical History:  Diagnosis Date  . A-fib ([place]) 09/20/2013  . Anxiety disorder 12/19/2009  . Arthritis of knee, degenerative 12/19/2009  . BP (high blood pressure) 12/19/2009  . Clinical depression 12/19/2009  . Depression   . 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 Elevated fasting blood sugar 12/19/2009  . Fatigue 12/19/2009  . HLD (hyperlipidemia) 12/19/2009  . HTN (hypertension)     Past Surgical History:  Procedure Laterality Date  . LEFT HEART CATH AND CORONARY ANGIOGRAPHY N/A 04/15/2018   Procedure: LEFT HEART CATH AND CORONARY ANGIOGRAPHY;  Surgeon: Leonie Man, MD;  Location: [facility];  Service: Cardiovascular;  Laterality: N/A;    Current Medications: Current Outpatient Medications on File Prior to Visit  Medication Sig  . amLODipine (NORVASC) 5 MG tablet Take 1 tablet (5 mg total) by mouth daily.  Marland Kitchen apixaban (ELIQUIS) 5 MG TABS tablet Take 5 mg by mouth 2 (two) times daily.  . calcium carbonate (TUMS) 500 MG chewable tablet Chew 1-2 tablets by mouth 3 (three) times daily as needed for indigestion or heartburn.   . Cholecalciferol (VITAMIN D3) 2000 units TABS Take 2,000 Units by mouth daily.  Marland Kitchen ENTRESTO 97-103 MG TAKE 1 TABLET BY MOUTH TWICE DAILY  .  metoprolol succinate (TOPROL-XL) 50 MG 24 hr tablet Take 50 mg by mouth 2 (two) times a day. Take with or immediately following a meal.  . sertraline (ZOLOFT) 100 MG tablet Take 50 mg by mouth daily.    No current facility-administered medications on file prior to visit.      Allergies:   Celebrex [celecoxib]   Social History   Socioeconomic History  . Marital status: Married    Spouse name: Not on file  . Number of children: Not on file  . Years of education: Not on file  . Highest education level: Not on file  Occupational History  . Not on file  Social  Needs  . Financial resource strain: Not on file  . Food insecurity    Worry: Not on file    Inability: Not on file  . Transportation needs    Medical: Not on file    Non-medical: Not on file  Tobacco Use  . Smoking status: Former Research scientist (life sciences)  . Smokeless tobacco: Never Used  . Tobacco comment: Quit about 3-4 months ago the occasional cigar  Substance and Sexual Activity  . Alcohol use: No  . Drug use: No  . Sexual activity: Not on file  Lifestyle  . Physical activity    Days per week: Not on file    Minutes per session: Not on file  . Stress: Not on file  Relationships  . Social Herbalist on phone: Not on file    Gets together: Not on file    Attends religious service: Not on file    Active member of club or organization: Not on file    Attends meetings of clubs or organizations: Not on file    Relationship status: Not on file  Other Topics Concern  . Not on file  Social History Narrative  . Not on file     Family History: [name] includes Heart attack (age of onset: 57) in his brother; Hypertension in his brother and father; Kidney Stones in his mother. Father has severe CAD, med mgmt only option.  ROS:   Please see the history of present illness.  Additional pertinent ROS: Constitutional: Negative for chills, fever, night sweats, unintentional weight loss  HENT: Negative for ear pain and hearing loss.   Eyes: Negative for loss of vision and eye pain.  Respiratory: Negative for cough, sputum, wheezing.   Cardiovascular: See HPI. Gastrointestinal: Negative for abdominal pain, melena, and hematochezia.  Genitourinary: Negative for dysuria and hematuria.  Musculoskeletal: Negative for falls and myalgias.  Skin: Negative for itching and rash.  Neurological: Negative for focal weakness, focal sensory changes and loss of consciousness.  Endo/Heme/Allergies: Does not bruise/bleed easily.    EKGs/Labs/Other Studies Reviewed:    The following  studies were reviewed today: Echo 03/04/19  1. The left ventricle has moderate-severely reduced systolic function, with an ejection fraction of 30-35%. The cavity size was mildly dilated. Left ventricular diastolic Doppler parameters are consistent with pseudonormalization. Left ventricular  diffuse hypokinesis.  2. The right ventricle has normal systolic function. The cavity was normal.  3. Left atrial size was moderately dilated.  4. The mitral valve is abnormal. Mild thickening of the mitral valve leaflet.  5. The tricuspid valve is grossly normal.  6. The aortic valve is tricuspid. Mild thickening of the aortic valve. Aortic valve regurgitation is mild by color flow Doppler. No stenosis of the aortic valve.  7. The aorta is abnormal in size and  structure.  8. There is mild dilatation of the aortic root measuring 40 mm.  9. Moderate to severe global reduction in LV systolic function; mild LVE; moderate diastolic dysfunction; moderate LAE; mildy dilated aortic root; mild AI; GLS-12.3%. Note cannot exclude apical thrombus; suggest FU limited study with definity to furter  Assess. (my comments: no thrombus seen, just not excluded. He is already on anticoagulation, so definity study not pursued)   Echo 08/11/18 - Left ventricle: The cavity size was mildly dilated. There was   mild focal basal hypertrophy of the septum. Systolic function was   moderately to severely reduced. The estimated ejection fraction   was in the range of 30% to 35%. Diffuse hypokinesis. Doppler   parameters are consistent with abnormal left ventricular   relaxation (grade 1 diastolic dysfunction). - Aortic valve: There was trivial regurgitation. - Aortic root: The aortic root was mildly dilated. - Ascending aorta: The ascending aorta was mildly dilated.  Impressions:  - Moderate to severe global reduction in LV systolic function; mild   diastolic dysfunction; mild LVE; trace AI; mildly dilated aortic    root/ascending aorta.   ECHO: 03/16/2018 -The cavity size was mildly dilated. Systolic function was mildly reduced. The estimated ejection fraction was in the range of 45% to 50%. Diffuse hypokinesis. Doppler parameters are consistent with abnormal left ventricular relaxation (grade 1 diastolic dysfunction). - Aortic valve: There was trivial regurgitation. - Aorta: Aortic root dimension: 40 mm (ED). - Ascending aorta: The ascending aorta was mildly dilated. - Mitral valve: Mildly thickened leaflets . There was trivial regurgitation. - Right ventricle: The cavity size was mildly dilated. Sidell thickness was normal.   CATH: 04/15/2018  Lat Ramus lesion is 40% stenosed. Otherwise minimal CAD.  There is mild left ventricular systolic dysfunction. The left ventricular ejection fraction is 45-50% by visual estimate.  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 This could potentially be the reason for his symptoms, but not anginal equivalent.   EKG:  EKG is personally reviewed.  The ekg ordered 02/25/19 demonstrates NSR, narrow QRS  Recent Labs: 09/21/2018: BUN 13; Creatinine, Ser 1.11; Potassium 4.5; Sodium 142 02/25/2019: Hemoglobin 14.4; Platelets 167  Recent Lipid Panel    Component Value Date/Time   CHOL 211 (H) 09/07/2018 1242   TRIG 147 09/07/2018 1242   HDL 62 09/07/2018 1242   CHOLHDL 3.4 09/07/2018 1242   LDLCALC 120 (H) 09/07/2018 1242    Physical Exam:    VS:  BP 122/68   Pulse 63   Ht 5\' 7"  (1.702 m)   Wt 193 lb 6.4 oz (87.7 kg)   SpO2 97%   BMI 30.29 kg/m     Wt Readings from Last 3 Encounters:  03/30/19 190 lb (86.2 kg)  03/30/19 193 lb 6.4 oz (87.7 kg)  02/25/19 198 lb (89.8 kg)    GEN: Well nourished, well developed in no acute distress HEENT: Normal, moist mucous membranes NECK: No JVD CARDIAC: regular rhythm, normal S1 and S2, no murmurs, rubs, gallops.  VASCULAR: Radial and DP pulses 2+ bilaterally. No carotid bruits  RESPIRATORY:  Clear to auscultation without rales, wheezing or rhonchi  ABDOMEN: Soft, non-tender, non-distended MUSCULOSKELETAL:  Ambulates independently SKIN: Warm and dry, no edema NEUROLOGIC:  Alert and oriented x 3. No focal neuro deficits noted. PSYCHIATRIC:  Normal affect   ASSESSMENT:    1. Chronic combined systolic and diastolic heart failure ([place])   2. NICM (nonischemic cardiomyopathy) ([place])   3. Essential hypertension   4. Paroxysmal atrial  fibrillation ([place])   5. Counseling on health promotion and disease prevention    PLAN:    Chronic combined systolic and diastolic heart failure: due to nonischemic cardiomyopathy -NYHA class II, though with some fatigue at baseline -continue metoprolol succinate 50 mg BID (likes BID dosing as he takes it with entresto) -continue entresto max dose 97-103 BID -recheck labs at follow up -we discussed indications for PM, CRT, ICD at length. He does not meet criteria for PM or CRT. See above conversation re: ICD -I offered referral to EP to discuss ICD. He would like to work on increasing his activity level gradually to see if that has an impact on his EF -will touch base again in 4 mos (07/2019, 1 year after echo that showed EF 30-35%). If EF has not improved on recheck, he will consider referral to EP to discuss ICD -counseled on red flag warning signs that need immediate medical attention  Hypertension: home BP cuff inaccurate. Readings at a good level here in the office -continue metoprolol, entresto as above -continue amlodipine  History of atrial fibrillation: diagnosed several years ago after an event at work. Paroxysmal, has occurred with stress/dental procedures in the past. He can tell when he is in afib generally.  -CHA2DS2/VAS Stroke Risk Points=3  -was on apixaban in the past for LV thrombus, but was not on blood thinner for atrial fibrillation -continue apixaban for afib stroke prevention.    Snoring, afib, HTN, fatigue:  referred for sleep study, pending this tonight  Prevention: -recommend heart healthy/Mediterranean diet, with whole grains, fruits, vegetable, fish, lean meats, nuts, and olive oil. Limit salt. -recommend moderate walking, 3-5 times/week for 30-50 minutes each session. Aim for at least 150 minutes.week. Goal should be pace of 3 miles/hours, or walking 1.5 miles in 30 minutes -recommend avoidance of tobacco products. Avoid excess alcohol. -Additional risk factor control:  -Diabetes: Prior A1c is 5.0%, no history  -Lipids: LDL 120 in 08/2018. Goal < 70 given nonobstructive CAD. We have discussed statin in the past. Continue to address periodically.  -Blood pressure control: as goal, above  -Weight: BMI 29, interested in weight loss  Plan for follow up: 4 mos  TIME SPENT WITH PATIENT: 25 minutes of direct patient care. More than 50% of that time was spent on coordination of care and counseling regarding echo results, recommendations/indications for PPM/CRT/ICD.  Buford Dresser, MD, PhD [place]  [facility]   Medication Adjustments/Labs and Tests Ordered: Current medicines are reviewed at length with the patient today.  Concerns regarding medicines are outlined above.  No orders of the defined types were placed in this encounter.  No orders of the defined types were placed in this encounter.   Patient Instructions  Medication Instructions:  Your Physician recommend you continue on your current medication as directed.    If you need a refill on your cardiac medications before your next appointment, please call your pharmacy.   Lab work: None  Testing/Procedures: None  Follow-Up: At [facility], you and your health needs are our priority.  As part of our continuing mission to provide you with exceptional heart care, we have created designated Provider Care Teams.  These Care Teams include your primary Cardiologist (physician) and Advanced Practice Providers (APPs -   Physician Assistants and Nurse Practitioners) who all work together to provide you with the care you need, when you need it. You will need a follow up appointment in 4 months.  Please call our office 2 months in  advance to schedule this appointment.  You may see Buford Dresser, MD or one of the following Advanced Practice Providers on your designated Care Team:   Rosaria Ferries, PA-C . Jory Sims, DNP, ANP       Signed, Buford Dresser, MD PhD 03/30/2019 9:58 PM    [place]

## 2019-03-31 ENCOUNTER — Other Ambulatory Visit (HOSPITAL_BASED_OUTPATIENT_CLINIC_OR_DEPARTMENT_OTHER): Payer: Self-pay

## 2019-03-31 DIAGNOSIS — R0683 Snoring: Secondary | ICD-10-CM

## 2019-04-05 ENCOUNTER — Encounter (HOSPITAL_BASED_OUTPATIENT_CLINIC_OR_DEPARTMENT_OTHER): Payer: Self-pay | Admitting: Cardiovascular Disease

## 2019-04-05 NOTE — Procedures (Signed)
Patient Name: Juan Montes, Juan Montes Date: 03/30/2019 Gender: Male D.O.B: 1951-04-27 Age (years): 68 Referring Provider: Buford Dresser Height (inches): 34 Interpreting Physician: Shelva Majestic MD, ABSM Weight (lbs): 190 RPSGT: Laren Everts BMI: 30 MRN: [id number] Neck Size: 16.00  CLINICAL INFORMATION Sleep Study Type: Split Night CPAP  Indication for sleep study: Fatigue, Hypertension, Obesity, Snoring  Epworth Sleepiness Score: 6  SLEEP STUDY TECHNIQUE As per the AASM Manual for the Scoring of Sleep and Associated Events v2.3 (April 2016) with a hypopnea requiring 4% desaturations.  The channels recorded and monitored were frontal, central and occipital EEG, electrooculogram (EOG), submentalis EMG (chin), nasal and oral airflow, thoracic and abdominal Voyles motion, anterior tibialis EMG, snore microphone, electrocardiogram, and pulse oximetry. Continuous positive airway pressure (CPAP) was initiated when the patient met split night criteria and was titrated according to treat sleep-disordered breathing.  MEDICATIONS     amLODipine (NORVASC) 5 MG tablet (Expired)             apixaban (ELIQUIS) 5 MG TABS tablet         calcium carbonate (TUMS) 500 MG chewable tablet         Cholecalciferol (VITAMIN D3) 2000 units TABS         ENTRESTO 97-103 MG         metoprolol succinate (TOPROL-XL) 50 MG 24 hr tablet         sertraline (ZOLOFT) 100 MG tablet      Medications self-administered by patient taken the night of the study : N/A  RESPIRATORY PARAMETERS Diagnostic Total AHI (/hr): 41.3 RDI (/hr): 62.7 OA Index (/hr): 3.9 CA Index (/hr): 0.5 REM AHI (/hr): 70.6 NREM AHI (/hr): 39.1 Supine AHI (/hr): 38.5 Non-supine AHI (/hr): 46.4 Min O2 Sat (%): 86.0 Mean O2 (%): 93.2 Time below 88% (min): 1.7   Titration Optimal Pressure (cm): 13 AHI at Optimal Pressure (/hr): 8.4 Min O2 at Optimal Pressure (%): 92.0 Supine % at Optimal (%): 100 Sleep % at Optimal (%): 99    SLEEP ARCHITECTURE The recording time for the entire night was 428.6 minutes.  During a baseline period of 234.6 minutes, the patient slept for 123.5 minutes in REM and nonREM, yielding a sleep efficiency of 52.6%%. Sleep onset after lights out was 79.8 minutes with a REM latency of 88.5 minutes. The patient spent 22.3%% of the night in stage N1 sleep, 70.9%% in stage N2 sleep, 0.0%% in stage N3 and 6.9% in REM.  During the titration period of 188.2 minutes, the patient slept for 155.5 minutes in REM and nonREM, yielding a sleep efficiency of 82.6%%. Sleep onset after CPAP initiation was 9.9 minutes with a REM latency of 5.5 minutes. The patient spent 14.5%% of the night in stage N1 sleep, 76.8%% in stage N2 sleep, 0.0%% in stage N3 and 8.7% in REM.  CARDIAC DATA The 2 lead EKG demonstrated sinus rhythm. The mean heart rate was 100.0 beats per minute. Other EKG findings include: PVCs.  LEG MOVEMENT DATA The total Periodic Limb Movements of Sleep (PLMS) were 0. The PLMS index was 0.0 .  IMPRESSIONS - Severe obstructive sleep apnea occurred during the diagnostic portion of the study (AHI 41.3/h; RDI 62.7/h). Events were very severe during REM sleep (AHI 70.6/h). CPAP was initiated at 5 cm and was titrated to 13 cm of water. AHI at 13 cm was 8.4/h and RDI was 11.3/h with 02 nadir at 92%. - No significant central sleep apnea occurred during the diagnostic portion of the  study (CAI = 0.5/hour). - Mild oxygen desaturation to a nadir of 86.0%. - The patient snored with moderate snoring volume during the diagnostic portion of the study. - EKG findings include PVCs. - Clinically significant periodic limb movements did not occur during sleep.  DIAGNOSIS - Obstructive Sleep Apnea (327.23 [G47.33 ICD-10])  RECOMMENDATIONS - Recommend an initial trial of CPAP Auto therapy with EPR at a range of 12 - 20 cm with heated humidification.  A Medium size Fisher&Paykel Full Face Mask Simplus mask was used for  the titration study. - Effort should be made to optimize nasal and oropharyngeal patency.  - Avoid alcohol, sedatives and other CNS depressants that may worsen sleep apnea and disrupt normal sleep architecture. - Sleep hygiene should be reviewed to assess factors that may improve sleep quality. - Weight management (BMI 30) and regular exercise should be initiated or continued. - Recommend a download in 30 days and sleep clinic evaluation after 4 weeks of therapy.  [Electronically signed] 04/05/2019 10:29 AM  Shelva Majestic MD, [facility], [place], American Board of Sleep Medicine   NPI: [id number] [place] PH: [phone]   FX: [phone] [place]

## 2019-04-07 ENCOUNTER — Telehealth: Payer: Self-pay | Admitting: *Deleted

## 2019-04-07 NOTE — Telephone Encounter (Signed)
Called patient to discuss sleep study results and recommendations. Received no answer. Could not leave VM. Mailbox full. Will attempt to call again later.

## 2019-04-09 DIAGNOSIS — Z23 Encounter for immunization: Secondary | ICD-10-CM | POA: Diagnosis not present

## 2019-04-15 DIAGNOSIS — M1611 Unilateral primary osteoarthritis, right hip: Secondary | ICD-10-CM | POA: Diagnosis not present

## 2019-04-15 DIAGNOSIS — M25551 Pain in right hip: Secondary | ICD-10-CM | POA: Diagnosis not present

## 2019-04-20 ENCOUNTER — Telehealth: Payer: Self-pay | Admitting: *Deleted

## 2019-04-20 NOTE — Telephone Encounter (Signed)
Patient notified of sleep study results and recommendations. He voiced verbal understanding and had no questions. CPAP order faxed to [facility].

## 2019-04-26 ENCOUNTER — Ambulatory Visit: Payer: PPO | Admitting: Gastroenterology

## 2019-04-28 DIAGNOSIS — M25551 Pain in right hip: Secondary | ICD-10-CM | POA: Diagnosis not present

## 2019-05-07 ENCOUNTER — Other Ambulatory Visit: Payer: Self-pay | Admitting: Cardiology

## 2019-05-18 DIAGNOSIS — G4733 Obstructive sleep apnea (adult) (pediatric): Secondary | ICD-10-CM | POA: Diagnosis not present

## 2019-05-26 ENCOUNTER — Encounter: Payer: Self-pay | Admitting: Gastroenterology

## 2019-05-26 ENCOUNTER — Ambulatory Visit: Payer: PPO | Admitting: Gastroenterology

## 2019-05-26 ENCOUNTER — Telehealth: Payer: Self-pay

## 2019-05-26 VITALS — BP 102/70 | HR 76 | Temp 98.4°F | Ht 67.0 in | Wt 197.0 lb

## 2019-05-26 DIAGNOSIS — Z1211 Encounter for screening for malignant neoplasm of colon: Secondary | ICD-10-CM

## 2019-05-26 DIAGNOSIS — Z1212 Encounter for screening for malignant neoplasm of rectum: Secondary | ICD-10-CM

## 2019-05-26 DIAGNOSIS — Z7901 Long term (current) use of anticoagulants: Secondary | ICD-10-CM

## 2019-05-26 DIAGNOSIS — Z1159 Encounter for screening for other viral diseases: Secondary | ICD-10-CM | POA: Diagnosis not present

## 2019-05-26 MED ORDER — NA SULFATE-K SULFATE-MG SULF 17.5-3.13-1.6 GM/177ML PO SOLN: 1.0000 | Freq: Once | ORAL | 0 refills | Status: AC

## 2019-05-26 NOTE — Telephone Encounter (Signed)
[facility] Pre-operative Risk Assessment     Request for surgical clearance:     Endoscopy Procedure  What type of surgery is being performed?     Colonoscopy  When is this surgery scheduled?     05/1119  What type of clearance is required ?   Pharmacy  Are there any medications that need to be held prior to surgery and how long? Eliquis x 2 days  Practice name and name of physician performing surgery?      [facility]  What is your office phone and fax number?      Phone- [phone]  Fax432 [phone]  Anesthesia type (None, local, MAC, general) ?       MAC

## 2019-05-26 NOTE — Progress Notes (Signed)
History of Present Illness: This is a 68 year old male referred by Juan Infante, MD for the evaluation of colorectal cancer screening maintained on Eliquis.  He was previously seen in late 2011 and was advised to undergo colonoscopy however it was not completed.  He has not had colonoscopy performed.  He relates no ongoing gastrointestinal complaints. Denies weight loss, abdominal pain, constipation, diarrhea, change in stool caliber, melena, hematochezia, nausea, vomiting, dysphagia, reflux symptoms, chest pain.   Allergies  Allergen Reactions  . Celebrex [Celecoxib]     itchy   Outpatient Medications Prior to Visit  Medication Sig Dispense Refill  . apixaban (ELIQUIS) 5 MG TABS tablet Take 5 mg by mouth 2 (two) times daily.    . calcium carbonate (TUMS) 500 MG chewable tablet Chew 1-2 tablets by mouth 3 (three) times daily as needed for indigestion or heartburn.     . Cholecalciferol (VITAMIN D3) 2000 units TABS Take 2,000 Units by mouth daily.    Marland Kitchen ENTRESTO 97-103 MG TAKE 1 TABLET BY MOUTH TWICE DAILY 60 tablet 0  . metoprolol succinate (TOPROL-XL) 50 MG 24 hr tablet Take 50 mg by mouth 2 (two) times a day. Take with or immediately following a meal.    . sertraline (ZOLOFT) 100 MG tablet Take 50 mg by mouth daily.     Marland Kitchen amLODipine (NORVASC) 5 MG tablet Take 1 tablet (5 mg total) by mouth daily. 90 tablet 3   No facility-administered medications prior to visit.    Past Medical History:  Diagnosis Date  . A-fib ([place]) 09/20/2013  . Anxiety disorder 12/19/2009  . Arthritis of knee, degenerative 12/19/2009  . BP (high blood pressure) 12/19/2009  . Clinical depression 12/19/2009  . Depression   . 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 Elevated fasting blood sugar 12/19/2009  . Fatigue 12/19/2009  . HLD (hyperlipidemia) 12/19/2009  . HTN (hypertension)    Past Surgical History:  Procedure Laterality Date  . HERNIA REPAIR    . KNEE  SURGERY Left   . LEFT HEART CATH AND CORONARY ANGIOGRAPHY N/A 04/15/2018   Procedure: LEFT HEART CATH AND CORONARY ANGIOGRAPHY;  Surgeon: Leonie Man, MD;  Location: [facility];  Service: Cardiovascular;  Laterality: N/A;   Social History   Socioeconomic History  . Marital status: Married    Spouse name: Not on file  . Number of children: Not on file  . Years of education: Not on file  . Highest education level: Not on file  Occupational History  . Occupation: retired  Scientific laboratory technician  . Financial resource strain: Not on file  . Food insecurity    Worry: Not on file    Inability: Not on file  . Transportation needs    Medical: Not on file    Non-medical: Not on file  Tobacco Use  . Smoking status: Former Research scientist (life sciences)  . Smokeless tobacco: Never Used  . Tobacco comment: Quit about 3-4 months ago the occasional cigar  Substance and Sexual Activity  . Alcohol use: No  . Drug use: No  . Sexual activity: Not on file  Lifestyle  . Physical activity    Days per week: Not on file    Minutes per session: Not on file  . Stress: Not on file  Relationships  . Social Herbalist on phone: Not on file    Gets together: Not on file    Attends religious service: Not on  file    Active member of club or organization: Not on file    Attends meetings of clubs or organizations: Not on file    Relationship status: Not on file  Other Topics Concern  . Not on file  Social History Narrative  . Not on file   Family History  Problem Relation Age of Onset  . Kidney Stones Mother   . Hypertension Father   . Heart attack Brother 30       had CABG  . Hypertension Brother        Review of Systems: Pertinent positive and negative review of systems were noted in the above HPI section. All other review of systems were otherwise negative.  Marland Kitchen  Physical Exam: General: Well developed, well nourished, no acute distress Head: Normocephalic and atraumatic Eyes:  sclerae anicteric,  EOMI Ears: Normal auditory acuity Mouth: No deformity or lesions Neck: Supple, no masses or thyromegaly Lungs: Clear throughout to auscultation Heart: Regular rate and rhythm; no murmurs, rubs or bruits Abdomen: Soft, non tender and non distended. No masses, hepatosplenomegaly or hernias noted. Normal Bowel sounds Rectal: Deferred to colonoscopy Musculoskeletal: Symmetrical with no gross deformities  Skin: No lesions on visible extremities Pulses:  Normal pulses noted Extremities: No clubbing, cyanosis, edema or deformities noted Neurological: Alert oriented x 4, grossly nonfocal Cervical Nodes:  No significant cervical adenopathy Inguinal Nodes: No significant inguinal adenopathy Psychological:  Alert and cooperative. Normal mood and affect   Assessment and Recommendations:  1.  Colorectal cancer screening, average risk.  Schedule colonoscopy. The risks (including bleeding, perforation, infection, missed lesions, medication reactions and possible hospitalization or surgery if complications occur), benefits, and alternatives to colonoscopy with possible biopsy and possible polypectomy were discussed with the patient and they consent to proceed.   2. Hold Eliquis 2 days before procedure - will instruct when and how to resume after procedure. Low but real risk of cardiovascular event such as heart attack, stroke, embolism, thrombosis or ischemia/infarct of other organs off Eliquis explained and need to seek urgent help if this occurs. The patient consents to proceed. Will communicate by phone or EMR with patient's prescribing provider to confirm that holding Eliquis is reasonable in this case.    cc: Juan Infante, MD [address] [place],  [place] [address]

## 2019-05-26 NOTE — Telephone Encounter (Signed)
Called patient Juan Montes and informed him to hold his Eliquis for 2 days prior to his procedure. Patient verbalized an understanding and all if any questions were answered.

## 2019-05-26 NOTE — Patient Instructions (Addendum)
You have been scheduled for a colonoscopy. Please follow written instructions given to you at your visit today.  Please pick up your prep supplies at the pharmacy within the next 1-3 days. If you use inhalers (even only as needed), please bring them with you on the day of your procedure. Your physician has requested that you go to www.startemmi.com and enter the access code given to you at your visit today. This web site gives a general overview about your procedure. However, you should still follow specific instructions given to you by our office regarding your preparation for the procedure.  Thank you for choosing me and [facility].  Malcolm T. Stark, Jr., MD., FACG  

## 2019-05-26 NOTE — Telephone Encounter (Signed)
Informed patient he can hold his Eliquis 2 days prior to his procedure. Patient verbalized understanding.

## 2019-05-26 NOTE — Telephone Encounter (Signed)
Pt takes Eliquis for afib with CHADS2VASc score of 4 (age, CHF, HTN, CAD), also with hx of prior LV thrombus and resolution in 2015. Renal function is normal. Ok to hold Eliquis for 2 days prior to procedure as requested.

## 2019-06-04 ENCOUNTER — Other Ambulatory Visit: Payer: Self-pay

## 2019-06-04 ENCOUNTER — Other Ambulatory Visit: Payer: Self-pay | Admitting: Cardiology

## 2019-06-04 ENCOUNTER — Telehealth: Payer: Self-pay

## 2019-06-04 DIAGNOSIS — Z1211 Encounter for screening for malignant neoplasm of colon: Secondary | ICD-10-CM

## 2019-06-04 NOTE — Telephone Encounter (Signed)
-----   Message from Ladene Artist, MD sent at 06/04/2019  9:44 AM EST ----- Barbera Setters, Please reschedule for [facility] outpatient procedures. Thanks, MS ----- Message ----- From: Osvaldo Angst, CRNA Sent: 06/01/2019   8:42 PM EST To: Ladene Artist, MD  Dr. Fuller Plan,  This pt is scheduled with you on 06/09/2019.  Unfortunately his EF on his last echocardigram was 30-35% so he does not qualify for care at [facility].  Thanks,  Osvaldo Angst

## 2019-06-04 NOTE — Telephone Encounter (Signed)
Patient notified of the need to change location and dates of his procedures due to low EF.  New instructions mailed to the patient.  He will call me once he receives them if he has questions.

## 2019-06-09 ENCOUNTER — Encounter: Payer: PPO | Admitting: Gastroenterology

## 2019-06-18 DIAGNOSIS — G4733 Obstructive sleep apnea (adult) (pediatric): Secondary | ICD-10-CM | POA: Diagnosis not present

## 2019-07-01 DIAGNOSIS — M25551 Pain in right hip: Secondary | ICD-10-CM | POA: Diagnosis not present

## 2019-07-01 DIAGNOSIS — M1611 Unilateral primary osteoarthritis, right hip: Secondary | ICD-10-CM | POA: Diagnosis not present

## 2019-07-03 ENCOUNTER — Other Ambulatory Visit: Payer: Self-pay | Admitting: Cardiology

## 2019-07-05 ENCOUNTER — Other Ambulatory Visit: Payer: Self-pay

## 2019-07-05 DIAGNOSIS — Z20822 Contact with and (suspected) exposure to covid-19: Secondary | ICD-10-CM

## 2019-07-06 LAB — NOVEL CORONAVIRUS, NAA: SARS-CoV-2, NAA: NOT DETECTED

## 2019-07-07 ENCOUNTER — Telehealth: Payer: Self-pay

## 2019-07-07 NOTE — Telephone Encounter (Signed)
Patient called in requesting [facility] lab results - DOB/Address verified - Negative results given. Reset MyChart password, no further questions.

## 2019-07-12 ENCOUNTER — Telehealth: Payer: Self-pay | Admitting: Gastroenterology

## 2019-07-12 NOTE — Telephone Encounter (Signed)
Appt has been cancelled.  

## 2019-07-14 ENCOUNTER — Other Ambulatory Visit: Payer: Self-pay | Admitting: Cardiology

## 2019-07-14 NOTE — Telephone Encounter (Signed)
Made in error

## 2019-07-14 NOTE — Telephone Encounter (Signed)
*  STAT* If patient is at the pharmacy, call can be transferred to refill team.   1. Which medications need to be refilled? (please list name of each medication and dose if known) amLODipine (NORVASC) 5 MG tablet  2. Which pharmacy/location (including street and city if local pharmacy) is medication to be sent to? [place], [place] - [address] AT [place]  3. Do they need a 30 day or 90 day supply? 90 day

## 2019-07-15 ENCOUNTER — Inpatient Hospital Stay (HOSPITAL_COMMUNITY): Admission: RE | Admit: 2019-07-15 | Payer: PPO | Source: Ambulatory Visit

## 2019-07-18 DIAGNOSIS — G4733 Obstructive sleep apnea (adult) (pediatric): Secondary | ICD-10-CM | POA: Diagnosis not present

## 2019-07-19 ENCOUNTER — Ambulatory Visit (HOSPITAL_COMMUNITY): Admit: 2019-07-19 | Payer: PPO | Admitting: Gastroenterology

## 2019-07-19 ENCOUNTER — Encounter (HOSPITAL_COMMUNITY): Payer: Self-pay

## 2019-07-19 SURGERY — COLONOSCOPY WITH PROPOFOL
Anesthesia: Monitor Anesthesia Care

## 2019-08-18 DIAGNOSIS — G4733 Obstructive sleep apnea (adult) (pediatric): Secondary | ICD-10-CM | POA: Diagnosis not present

## 2019-08-24 ENCOUNTER — Ambulatory Visit (HOSPITAL_COMMUNITY)
Admission: RE | Admit: 2019-08-24 | Discharge: 2019-08-24 | Disposition: A | Discharge status: Home / Self Care | Payer: PPO | Source: Ambulatory Visit | Attending: Physician Assistant | Admitting: Physician Assistant

## 2019-08-24 ENCOUNTER — Other Ambulatory Visit: Payer: Self-pay

## 2019-08-24 ENCOUNTER — Encounter (HOSPITAL_COMMUNITY): Payer: Self-pay | Admitting: Physician Assistant

## 2019-08-24 VITALS — BP 122/80 | HR 81 | Ht 67.0 in | Wt 194.4 lb

## 2019-08-24 DIAGNOSIS — E669 Obesity, unspecified: Secondary | ICD-10-CM | POA: Insufficient documentation

## 2019-08-24 DIAGNOSIS — G4733 Obstructive sleep apnea (adult) (pediatric): Secondary | ICD-10-CM | POA: Diagnosis not present

## 2019-08-24 DIAGNOSIS — Z6830 Body mass index (BMI) 30.0-30.9, adult: Secondary | ICD-10-CM | POA: Insufficient documentation

## 2019-08-24 DIAGNOSIS — F419 Anxiety disorder, unspecified: Secondary | ICD-10-CM | POA: Insufficient documentation

## 2019-08-24 DIAGNOSIS — E785 Hyperlipidemia, unspecified: Secondary | ICD-10-CM | POA: Insufficient documentation

## 2019-08-24 DIAGNOSIS — I5022 Chronic systolic (congestive) heart failure: Secondary | ICD-10-CM | POA: Diagnosis not present

## 2019-08-24 DIAGNOSIS — I110 Hypertensive heart disease with heart failure: Secondary | ICD-10-CM | POA: Diagnosis not present

## 2019-08-24 DIAGNOSIS — Z79899 Other long term (current) drug therapy: Secondary | ICD-10-CM | POA: Insufficient documentation

## 2019-08-24 DIAGNOSIS — Z86718 Personal history of other venous thrombosis and embolism: Secondary | ICD-10-CM | POA: Diagnosis not present

## 2019-08-24 DIAGNOSIS — Z87891 Personal history of nicotine dependence: Secondary | ICD-10-CM | POA: Diagnosis not present

## 2019-08-24 DIAGNOSIS — D6869 Other thrombophilia: Secondary | ICD-10-CM | POA: Diagnosis not present

## 2019-08-24 DIAGNOSIS — I2510 Atherosclerotic heart disease of native coronary artery without angina pectoris: Secondary | ICD-10-CM | POA: Diagnosis not present

## 2019-08-24 DIAGNOSIS — Z7901 Long term (current) use of anticoagulants: Secondary | ICD-10-CM | POA: Insufficient documentation

## 2019-08-24 DIAGNOSIS — Z8249 Family history of ischemic heart disease and other diseases of the circulatory system: Secondary | ICD-10-CM | POA: Diagnosis not present

## 2019-08-24 DIAGNOSIS — I480 Paroxysmal atrial fibrillation: Secondary | ICD-10-CM | POA: Insufficient documentation

## 2019-08-24 NOTE — Progress Notes (Signed)
Primary Care Physician: Crist Infante, MD Primary Cardiologist: Dr [name] Primary Electrophysiologist: none Referring Physician: Dr Caren Hazy is a 69 y.o. male with a history of chronic systolic heart failure, HTN, OSA, HLD, prior LV thrombus with resolution (2015) and paroxysmal atrial fibrillation who presents for follow up in the [facility].  The patient was initially diagnosed with atrial fibrillation about five years ago at a work event and appeared to be related to stress/dental procedures. Patient was recently at his PCP and found to be in rate controlled afib along with symptoms of fatigue and chest discomfort. He was restarted on Eliquis on 6/25 for a CHADS2VASC score of 4. He denies significant alcohol use.  On follow up today, patient reports that he has done well from a cardiac standpoint. He has not had any further symptoms of afib. His only compliant today is chronic hip pain.   Today, he denies symptoms of palpitations, chest pain, shortness of breath, orthopnea, PND, lower extremity edema, dizziness, presyncope, syncope, snoring, daytime somnolence, bleeding, or neurologic sequela. The patient is tolerating medications without difficulties and is otherwise without complaint today.    Atrial Fibrillation Risk Factors:  he does have symptoms of sleep apnea.  He is compliant with CPAP. he does not have a history of rheumatic fever. he does have a history of alcohol use. The patient does not have a history of early familial atrial fibrillation or other arrhythmias.  he has a BMI of Body mass index is 30.45 kg/m.Marland Kitchen Filed Weights   08/24/19 1332  Weight: 88.2 kg    Family History  Problem Relation Age of Onset  . Kidney Stones Mother   . Hypertension Father   . Heart attack Brother 67       had CABG  . Hypertension Brother      Atrial Fibrillation Management history:  Previous antiarrhythmic drugs: none Previous  cardioversions: none Previous ablations: none CHADS2VASC score: 4 Anticoagulation history: Eliquis   Past Medical History:  Diagnosis Date  . A-fib ([place]) 09/20/2013  . Anxiety disorder 12/19/2009  . Arthritis of knee, degenerative 12/19/2009  . BP (high blood pressure) 12/19/2009  . Clinical depression 12/19/2009  . Depression   . 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 Elevated fasting blood sugar 12/19/2009  . Fatigue 12/19/2009  . HLD (hyperlipidemia) 12/19/2009  . HTN (hypertension)    Past Surgical History:  Procedure Laterality Date  . HERNIA REPAIR    . KNEE SURGERY Left   . LEFT HEART CATH AND CORONARY ANGIOGRAPHY N/A 04/15/2018   Procedure: LEFT HEART CATH AND CORONARY ANGIOGRAPHY;  Surgeon: Leonie Man, MD;  Location: [facility];  Service: Cardiovascular;  Laterality: N/A;    Current Outpatient Medications  Medication Sig Dispense Refill  . amLODipine (NORVASC) 5 MG tablet TAKE 1 TABLET(5 MG) BY MOUTH DAILY 90 tablet 3  . apixaban (ELIQUIS) 5 MG TABS tablet Take 5 mg by mouth 2 (two) times daily.    . calcium carbonate (TUMS) 500 MG chewable tablet Chew 1-2 tablets by mouth 3 (three) times daily as needed for indigestion or heartburn.     . Cholecalciferol (VITAMIN D3) 2000 units TABS Take 2,000 Units by mouth daily.    Marland Kitchen ENTRESTO 97-103 MG TAKE 1 TABLET BY MOUTH TWICE DAILY 60 tablet 6  . ezetimibe (ZETIA) 10 MG tablet Take 10 mg by mouth daily.    . metoprolol  succinate (TOPROL-XL) 100 MG 24 hr tablet Take 100 mg by mouth daily.    . sertraline (ZOLOFT) 100 MG tablet Take 50 mg by mouth daily.      No current facility-administered medications for this encounter.    Allergies  Allergen Reactions  . Celebrex [Celecoxib]     itchy    Social History   Socioeconomic History  . Marital status: Married    Spouse name: Not on file  . Number of children: Not on file  . Years of education: Not on file  .  Highest education level: Not on file  Occupational History  . Occupation: retired  Tobacco Use  . Smoking status: Former Research scientist (life sciences)  . Smokeless tobacco: Never Used  . Tobacco comment: Quit about 3-4 months ago the occasional cigar  Substance and Sexual Activity  . Alcohol use: No  . Drug use: No  . Sexual activity: Not on file  Other Topics Concern  . Not on file  Social History Narrative  . Not on file   Social Determinants of Health   Financial Resource Strain:   . Difficulty of Paying Living Expenses: Not on file  Food Insecurity:   . Worried About Charity fundraiser in the Last Year: Not on file  . Ran Out of Food in the Last Year: Not on file  Transportation Needs:   . Lack of Transportation (Medical): Not on file  . Lack of Transportation (Non-Medical): Not on file  Physical Activity:   . Days of Exercise per Week: Not on file  . Minutes of Exercise per Session: Not on file  Stress:   . Feeling of Stress : Not on file  Social Connections:   . Frequency of Communication with Friends and Family: Not on file  . Frequency of Social Gatherings with Friends and Family: Not on file  . Attends Religious Services: Not on file  . Active Member of Clubs or Organizations: Not on file  . Attends Archivist Meetings: Not on file  . Marital Status: Not on file  Intimate Partner Violence:   . Fear of Current or Ex-Partner: Not on file  . Emotionally Abused: Not on file  . Physically Abused: Not on file  . Sexually Abused: Not on file     ROS- All systems are reviewed and negative except as per the HPI above.  Physical Exam: Vitals:   08/24/19 1332  BP: 122/80  Pulse: 81  Weight: 88.2 kg  Height: 5\' 7"  (1.702 m)    GEN- The patient is well appearing obese male, alert and oriented x 3 today.   HEENT-head normocephalic, atraumatic, sclera clear, conjunctiva pink, hearing intact, trachea midline. Lungs- Clear to ausculation bilaterally, normal work of  breathing Heart- Regular rate and rhythm, no murmurs, rubs or gallops  GI- soft, NT, ND, + BS Extremities- no clubbing, cyanosis, or edema MS- no significant deformity or atrophy Skin- no rash or lesion Psych- euthymic mood, full affect Neuro- strength and sensation are intact   Wt Readings from Last 3 Encounters:  08/24/19 88.2 kg  05/26/19 89.4 kg  03/30/19 86.2 kg    EKG today demonstrates SR HR 64, 1st degree AV block, PR 202, QRS 98, QTc 451  Echo 03/04/19 demonstrated   1. The left ventricle has moderate-severely reduced systolic function, with an ejection fraction of 30-35%. The cavity size was mildly dilated. Left ventricular diastolic Doppler parameters are consistent with pseudonormalization. Left ventricular  diffuse hypokinesis.  2. The right  ventricle has normal systolic function. The cavity was normal.  3. Left atrial size was moderately dilated.  4. The mitral valve is abnormal. Mild thickening of the mitral valve leaflet.  5. The tricuspid valve is grossly normal.  6. The aortic valve is tricuspid. Mild thickening of the aortic valve. Aortic valve regurgitation is mild by color flow Doppler. No stenosis of the aortic valve.  7. The aorta is abnormal in size and structure.  8. There is mild dilatation of the aortic root measuring 40 mm.  9. Moderate to severe global reduction in LV systolic function; mild LVE; moderate diastolic dysfunction; moderate LAE; mildy dilated aortic root; mild AI; GLS-12.3%. Note cannot exclude apical thrombus; suggest FU limited study with definity to furter  assess.  Epic records are reviewed at length today  Assessment and Plan:  1. Paroxsymal atrial fibrillation Patient appears to be maintaining SR. Continue Eliquis 5 mg BID Continue Toprol 50 mg BID Lifestyle changes as below.  This patients CHA2DS2-VASc Score and unadjusted Ischemic Stroke Rate (% per year) is equal to 4.8 % stroke rate/year from a score of 4  Above score  calculated as 1 point each if present [CHF, HTN, DM, Vascular=MI/PAD/Aortic Plaque, Age if 65-74, or Male] Above score calculated as 2 points each if present [Age > 75, or Stroke/TIA/TE]   2. Obesity Body mass index is 30.45 kg/m. Lifestyle modification was discussed and encouraged including regular physical activity and weight reduction.  3. OSA The importance of adequate treatment of sleep apnea was discussed today in order to improve our ability to maintain sinus rhythm long term. Patient compliant with CPAP therapy.  4. Combined systolic and diastolic CHF EF 99991111 on last echo.  No signs or symptoms of fluid overload. Followed by Dr [name].   5. HTN Stable, no changes today.  6. CAD Non obstructive on [facility] 03/2018.  No anginal symptoms.  Followed by Dr [name].   Follow up in the AF clinic in 6 months.    [facility] [address] [place], [place] [address] [phone] 08/24/2019 3:25 PM

## 2019-09-02 DIAGNOSIS — G4733 Obstructive sleep apnea (adult) (pediatric): Secondary | ICD-10-CM | POA: Diagnosis not present

## 2019-09-08 DIAGNOSIS — M25551 Pain in right hip: Secondary | ICD-10-CM | POA: Diagnosis not present

## 2019-09-18 DIAGNOSIS — G4733 Obstructive sleep apnea (adult) (pediatric): Secondary | ICD-10-CM | POA: Diagnosis not present

## 2019-09-23 DIAGNOSIS — F329 Major depressive disorder, single episode, unspecified: Secondary | ICD-10-CM | POA: Diagnosis not present

## 2019-09-23 DIAGNOSIS — M25551 Pain in right hip: Secondary | ICD-10-CM | POA: Diagnosis not present

## 2019-09-23 DIAGNOSIS — I428 Other cardiomyopathies: Secondary | ICD-10-CM | POA: Diagnosis not present

## 2019-09-23 DIAGNOSIS — D6869 Other thrombophilia: Secondary | ICD-10-CM | POA: Diagnosis not present

## 2019-09-23 DIAGNOSIS — R7301 Impaired fasting glucose: Secondary | ICD-10-CM | POA: Diagnosis not present

## 2019-09-23 DIAGNOSIS — I5020 Unspecified systolic (congestive) heart failure: Secondary | ICD-10-CM | POA: Diagnosis not present

## 2019-09-23 DIAGNOSIS — I10 Essential (primary) hypertension: Secondary | ICD-10-CM | POA: Diagnosis not present

## 2019-09-23 DIAGNOSIS — I4891 Unspecified atrial fibrillation: Secondary | ICD-10-CM | POA: Diagnosis not present

## 2019-09-23 DIAGNOSIS — F419 Anxiety disorder, unspecified: Secondary | ICD-10-CM | POA: Diagnosis not present

## 2019-09-23 DIAGNOSIS — I2510 Atherosclerotic heart disease of native coronary artery without angina pectoris: Secondary | ICD-10-CM | POA: Diagnosis not present

## 2019-10-16 DIAGNOSIS — G4733 Obstructive sleep apnea (adult) (pediatric): Secondary | ICD-10-CM | POA: Diagnosis not present

## 2019-11-10 ENCOUNTER — Other Ambulatory Visit: Payer: Self-pay | Admitting: Cardiology

## 2019-11-16 DIAGNOSIS — G4733 Obstructive sleep apnea (adult) (pediatric): Secondary | ICD-10-CM | POA: Diagnosis not present

## 2019-12-16 DIAGNOSIS — G4733 Obstructive sleep apnea (adult) (pediatric): Secondary | ICD-10-CM | POA: Diagnosis not present

## 2020-01-16 DIAGNOSIS — G4733 Obstructive sleep apnea (adult) (pediatric): Secondary | ICD-10-CM | POA: Diagnosis not present

## 2020-01-19 DIAGNOSIS — M25551 Pain in right hip: Secondary | ICD-10-CM | POA: Diagnosis not present

## 2020-02-15 DIAGNOSIS — G4733 Obstructive sleep apnea (adult) (pediatric): Secondary | ICD-10-CM | POA: Diagnosis not present

## 2020-02-28 ENCOUNTER — Other Ambulatory Visit: Payer: Self-pay | Admitting: Cardiology

## 2020-02-29 ENCOUNTER — Other Ambulatory Visit: Payer: Self-pay | Admitting: Cardiology

## 2020-02-29 NOTE — Telephone Encounter (Signed)
   *  STAT* If patient is at the pharmacy, call can be transferred to refill team.   1. Which medications need to be refilled? (please list name of each medication and dose if known)   ENTRESTO 97-103 MG    2. Which pharmacy/location (including street and city if local pharmacy) is medication to be sent to? [place], [place] - [address] AT [place]  3. Do they need a 30 day or 90 day supply? 90 days  Pt's wife calling, she said pt been out of this medication for 2 days

## 2020-03-01 ENCOUNTER — Ambulatory Visit (HOSPITAL_COMMUNITY)
Admission: RE | Admit: 2020-03-01 | Discharge: 2020-03-01 | Disposition: A | Discharge status: Home / Self Care | Payer: PPO | Source: Ambulatory Visit | Attending: Physician Assistant | Admitting: Physician Assistant

## 2020-03-01 ENCOUNTER — Other Ambulatory Visit: Payer: Self-pay

## 2020-03-01 VITALS — BP 138/88 | HR 59 | Ht 67.0 in | Wt 191.4 lb

## 2020-03-01 DIAGNOSIS — D6869 Other thrombophilia: Secondary | ICD-10-CM

## 2020-03-01 DIAGNOSIS — M1710 Unilateral primary osteoarthritis, unspecified knee: Secondary | ICD-10-CM | POA: Insufficient documentation

## 2020-03-01 DIAGNOSIS — Z87891 Personal history of nicotine dependence: Secondary | ICD-10-CM | POA: Insufficient documentation

## 2020-03-01 DIAGNOSIS — Z79899 Other long term (current) drug therapy: Secondary | ICD-10-CM | POA: Diagnosis not present

## 2020-03-01 DIAGNOSIS — I110 Hypertensive heart disease with heart failure: Secondary | ICD-10-CM | POA: Diagnosis not present

## 2020-03-01 DIAGNOSIS — F419 Anxiety disorder, unspecified: Secondary | ICD-10-CM | POA: Insufficient documentation

## 2020-03-01 DIAGNOSIS — I480 Paroxysmal atrial fibrillation: Secondary | ICD-10-CM | POA: Diagnosis not present

## 2020-03-01 DIAGNOSIS — Z8249 Family history of ischemic heart disease and other diseases of the circulatory system: Secondary | ICD-10-CM | POA: Diagnosis not present

## 2020-03-01 DIAGNOSIS — I2510 Atherosclerotic heart disease of native coronary artery without angina pectoris: Secondary | ICD-10-CM | POA: Diagnosis not present

## 2020-03-01 DIAGNOSIS — F329 Major depressive disorder, single episode, unspecified: Secondary | ICD-10-CM | POA: Diagnosis not present

## 2020-03-01 DIAGNOSIS — E785 Hyperlipidemia, unspecified: Secondary | ICD-10-CM | POA: Insufficient documentation

## 2020-03-01 DIAGNOSIS — Z7901 Long term (current) use of anticoagulants: Secondary | ICD-10-CM | POA: Insufficient documentation

## 2020-03-01 DIAGNOSIS — I5022 Chronic systolic (congestive) heart failure: Secondary | ICD-10-CM | POA: Insufficient documentation

## 2020-03-01 DIAGNOSIS — G4733 Obstructive sleep apnea (adult) (pediatric): Secondary | ICD-10-CM | POA: Insufficient documentation

## 2020-03-01 NOTE — Telephone Encounter (Signed)
Refill for [facility] sent to pharmacy.

## 2020-03-01 NOTE — Progress Notes (Signed)
Primary Care Physician: Crist Infante, MD Primary Cardiologist: Dr [name] Primary Electrophysiologist: none Referring Physician: Dr Caren Hazy is a 69 y.o. male with a history of chronic systolic heart failure, HTN, OSA, HLD, prior LV thrombus with resolution (2015) and paroxysmal atrial fibrillation who presents for follow up in the [facility].  The patient was initially diagnosed with atrial fibrillation about five years ago at a work event and appeared to be related to stress/dental procedures. Patient was at his PCP and found to be in rate controlled afib along with symptoms of fatigue and chest discomfort. He was restarted on Eliquis on 01/21/19 for a CHADS2VASC score of 4. He denies significant alcohol use.  On follow up today, patient reports that he has done well since his last visit. He reports that he had one episode of afib associated with alcohol intake. He quickly converted back to SR. He denies bleeding issues on anticoagulation. Of note, he is being considered for hip surgery.   Today, he denies symptoms of palpitations, chest pain, shortness of breath, orthopnea, PND, lower extremity edema, dizziness, presyncope, syncope, snoring, daytime somnolence, bleeding, or neurologic sequela. The patient is tolerating medications without difficulties and is otherwise without complaint today.    Atrial Fibrillation Risk Factors:  he does have symptoms of sleep apnea.  He is compliant with CPAP. he does not have a history of rheumatic fever. he does have a history of alcohol use. The patient does not have a history of early familial atrial fibrillation or other arrhythmias.  he has a BMI of Body mass index is 29.98 kg/m.Marland Kitchen Filed Weights   03/01/20 0851  Weight: 86.8 kg    Family History  Problem Relation Age of Onset   Kidney Stones Mother    Hypertension Father    Heart attack Brother 43       had CABG   Hypertension Brother       Atrial Fibrillation Management history:  Previous antiarrhythmic drugs: none Previous cardioversions: none Previous ablations: none CHADS2VASC score: 4 Anticoagulation history: Eliquis   Past Medical History:  Diagnosis Date   A-fib ([place]) 09/20/2013   Anxiety disorder 12/19/2009   Arthritis of knee, degenerative 12/19/2009   BP (high blood pressure) 12/19/2009   Clinical depression 12/19/2009   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     amLODipine (NORVASC) 5 MG tablet TAKE 1 TABLET(5 MG) BY MOUTH DAILY 90 tablet 3   apixaban (ELIQUIS) 5 MG TABS tablet Take 5 mg by mouth 2 (two) times daily.     calcium carbonate (TUMS) 500 MG chewable tablet Chew 1-2 tablets by mouth 3 (three) times daily as needed for indigestion or heartburn.      Cholecalciferol (VITAMIN D3) 2000 units TABS Take 2,000  Units by mouth daily.     ENTRESTO 97-103 MG TAKE 1 TABLET BY MOUTH TWICE DAILY 60 tablet 6   ezetimibe (ZETIA) 10 MG tablet Take 10 mg by mouth daily.     metoprolol succinate (TOPROL-XL) 100 MG 24 hr tablet TAKE 1 TABLET BY MOUTH DAILY. TAKE WITH OR IMMEDIATELY FOLLOWING A MEAL. 90 tablet 1   sertraline (ZOLOFT) 100 MG tablet Take 100 mg by mouth daily.      traMADol (ULTRAM) 50 MG tablet Take 50 mg by mouth as  needed.      No current facility-administered medications for this encounter.    Allergies  Allergen Reactions   Celebrex [Celecoxib]     itchy    Social History   Socioeconomic History   Marital status: Married    Spouse name: Not on file   Number of children: Not on file   Years of education: Not on file   Highest education level: Not on file  Occupational History   Occupation: retired  Tobacco Use   Smoking status: Former Smoker   Smokeless tobacco: Never Used   Tobacco comment: Quit about 3-4 months ago the occasional cigar  Vaping Use   Vaping Use: Never used  Substance and Sexual Activity   Alcohol use: No   Drug use: No   Sexual activity: Not on file  Other Topics Concern   Not on file  Social History Narrative   Not on file   Social Determinants of Health   Financial Resource Strain:    Difficulty of Paying Living Expenses:   Food Insecurity:    Worried About Charity fundraiser in the Last Year:    Arboriculturist in the Last Year:   Transportation Needs:    Film/video editor (Medical):    Lack of Transportation (Non-Medical):   Physical Activity:    Days of Exercise per Week:    Minutes of Exercise per Session:   Stress:    Feeling of Stress :   Social Connections:    Frequency of Communication with Friends and Family:    Frequency of Social Gatherings with Friends and Family:    Attends Religious Services:    Active Member of Clubs or Organizations:    Attends Music therapist:    Marital Status:   Intimate Partner Violence:    Fear of Current or Ex-Partner:    Emotionally Abused:    Physically Abused:    Sexually Abused:      ROS- All systems are reviewed and negative except as per the HPI above.  Physical Exam: Vitals:   03/01/20 0851  BP: 138/88  Pulse: (!) 59  Weight: 86.8 kg  Height: 5\' 7"  (1.702 m)    GEN- The patient is well appearing, alert and oriented x 3 today.    HEENT-head normocephalic, atraumatic, sclera clear, conjunctiva pink, hearing intact, trachea midline. Lungs- Clear to ausculation bilaterally, normal work of breathing Heart- Regular rate and rhythm, no murmurs, rubs or gallops  GI- soft, NT, ND, + BS Extremities- no clubbing, cyanosis, or edema MS- no significant deformity or atrophy Skin- no rash or lesion Psych- euthymic mood, full affect Neuro- strength and sensation are intact   Wt Readings from Last 3 Encounters:  03/01/20 86.8 kg  08/24/19 88.2 kg  05/26/19 89.4 kg    EKG today demonstrates SB HR 59, PR 198, QRS 100, QTc 425  Echo 03/04/19 demonstrated   1. The left ventricle has moderate-severely reduced  systolic function, with an ejection fraction of 30-35%. The cavity size was mildly dilated. Left ventricular diastolic Doppler parameters are consistent with pseudonormalization. Left ventricular  diffuse hypokinesis.  2. The right ventricle has normal systolic function. The cavity was normal.  3. Left atrial size was moderately dilated.  4. The mitral valve is abnormal. Mild thickening of the mitral valve leaflet.  5. The tricuspid valve is grossly normal.  6. The aortic valve is tricuspid. Mild thickening of the aortic valve. Aortic valve regurgitation is mild by color flow Doppler. No stenosis of the aortic valve.  7. The aorta is abnormal in size and structure.  8. There is mild dilatation of the aortic root measuring 40 mm.  9. Moderate to severe global reduction in LV systolic function; mild LVE; moderate diastolic dysfunction; moderate LAE; mildy dilated aortic root; mild AI; GLS-12.3%. Note cannot exclude apical thrombus; suggest FU limited study with definity to furter  assess.  Epic records are reviewed at length today  Assessment and Plan:  1. Paroxsymal atrial fibrillation Patient appears to be maintaining SR. Continue Eliquis 5 mg BID. No plans for DCCV or ablation, would be OK from an afib standpoint to  hold anticoagulation if hip surgery is needed.  Continue Toprol 50 mg BID  This patients CHA2DS2-VASc Score and unadjusted Ischemic Stroke Rate (% per year) is equal to 4.8 % stroke rate/year from a score of 4  Above score calculated as 1 point each if present [CHF, HTN, DM, Vascular=MI/PAD/Aortic Plaque, Age if 65-74, or Male] Above score calculated as 2 points each if present [Age > 75, or Stroke/TIA/TE]   2. OSA Patient reports compliance with CPAP therapy.  3. Combined systolic and diastolic CHF EF 18-98% on last echo.  No signs or symptoms of fluid overload.  4. HTN Stable, no changes today.  5. CAD Non obstructive on [facility] 03/2018.  No anginal symptoms.   Follow up with Coletta Memos as scheduled. AF clinic in 6 months.    [facility] [address] [place], [place] [address] [phone] 03/01/2020 9:33 AM

## 2020-03-14 NOTE — Progress Notes (Signed)
Cardiology Clinic Note   Patient Name: Juan Montes Date of Encounter: 03/16/2020  Primary Care Provider:  Crist Infante, MD Primary Cardiologist:  Juan Dresser, MD  Patient Profile    Juan Montes 69 year old male presents the clinic today for follow-up evaluation of his paroxysmal atrial fibrillation and preoperative cardiac evaluation.  Past Medical History    Past Medical History:  Diagnosis Date  . A-fib ([place]) 09/20/2013  . Anxiety disorder 12/19/2009  . Arthritis of knee, degenerative 12/19/2009  . BP (high blood pressure) 12/19/2009  . Clinical depression 12/19/2009  . Depression   . 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 Elevated fasting blood sugar 12/19/2009  . Fatigue 12/19/2009  . HLD (hyperlipidemia) 12/19/2009  . HTN (hypertension)    Past Surgical History:  Procedure Laterality Date  . HERNIA REPAIR    . KNEE SURGERY Left   . LEFT HEART CATH AND CORONARY ANGIOGRAPHY N/A 04/15/2018   Procedure: LEFT HEART CATH AND CORONARY ANGIOGRAPHY;  Surgeon: Juan Man, MD;  Location: [place];  Service: Cardiovascular;  Laterality: N/A;    Allergies  Allergies  Allergen Reactions  . Celebrex [Celecoxib]     itchy    History of Present Illness    Mr. Juan Montes PMH includes paroxysmal atrial fibrillation, essential hypertension, atypical angina, combined systolic and diastolic heart failure, nonischemic cardiomyopathy, anxiety, fatigue, dyspnea, prior LV thrombus with resolution 2015, and secondary hypercoagulable state.  His last seen by the atrial fibrillation clinic on 03/01/2020.  It was noted that his atrial fibrillation had initially been diagnosed after increased stress/dental procedure.  He reported to his PCP and was found to be in rate controlled A. fib and displaying fatigue.  He was started on Eliquis 01/21/2019 and his CHA2DS2-VASc score is 4.  During his 03/01/2020 clinic visit  he reported he  was doing well.  He had have 1 breakthrough episode of atrial fibrillation after consuming EtOH.  He denied bleeding complications.  He also mentioned a need for hip surgery.  He denied palpitations, chest pain, shortness of breath, PND, dizziness, and lower extremity edema.  He presents the clinic today for follow-up evaluation and preop cardiac evaluation.  He states he is having quite a bit of pain with his right hip.  This is limiting his physical activity.  He has been swimming some but has noticed a large decrease in his muscle strength.  He has an appointment with Dr. Wynelle Montes in September for reevaluation.  He said he has tried hip injections which do not provide much relief.  I have calculated his RCRI score at 0.9 and we will send his cardiac evaluation when a formal request has been received.  I have instructed him to try OTC diclofenac topical NSAID gel to help with pain.  Today he denies chest pain, shortness of breath, lower extremity edema, fatigue, palpitations, melena, hematuria, hemoptysis, diaphoresis, weakness, presyncope, syncope, orthopnea, and PND.   Home Medications    Prior to Admission medications   Medication Sig Start Date End Date Taking? Authorizing Provider  acetaminophen (TYLENOL 8 HOUR ARTHRITIS PAIN) 650 MG CR tablet Take 1,300 mg by mouth as needed for pain.    [provider]  amLODipine (NORVASC) 5 MG tablet TAKE 1 TABLET(5 MG) BY MOUTH DAILY 07/14/19   Juan Dresser, MD  apixaban (ELIQUIS) 5 MG TABS tablet Take 5 mg by mouth 2 (two) times daily.    [provider]  calcium carbonate (TUMS) 500 MG chewable tablet Chew 1-2 tablets by mouth 3 (three) times daily as needed for indigestion or heartburn.  12/19/09   [provider]  Cholecalciferol (VITAMIN D3) 2000 units TABS Take 2,000 Units by mouth daily.    [provider]  ENTRESTO 97-103 MG TAKE 1 TABLET BY MOUTH TWICE DAILY 03/01/20   Juan Pelton, NP  ezetimibe  (ZETIA) 10 MG tablet Take 10 mg by mouth daily. 06/09/19   [provider]  metoprolol succinate (TOPROL-XL) 100 MG 24 hr tablet TAKE 1 TABLET BY MOUTH DAILY. TAKE WITH OR IMMEDIATELY FOLLOWING A MEAL. 11/10/19   Juan Dresser, MD  sertraline (ZOLOFT) 100 MG tablet Take 100 mg by mouth daily.  08/08/10   [provider]  traMADol (ULTRAM) 50 MG tablet Take 50 mg by mouth as needed.  02/09/20   [provider]    Family History    Family History  Problem Relation Age of Onset  . Kidney Stones Mother   . Hypertension Father   . Heart attack Brother 32       had CABG  . Hypertension Brother    He indicated that his mother is alive. He indicated that his father is alive. He indicated that his brother is alive.  Social History    Social History   Socioeconomic History  . Marital status: Married    Spouse name: Not on file  . Number of children: Not on file  . Years of education: Not on file  . Highest education level: Not on file  Occupational History  . Occupation: retired  Tobacco Use  . Smoking status: Former Research scientist (life sciences)  . Smokeless tobacco: Never Used  . Tobacco comment: Quit about 3-4 months ago the occasional cigar  Vaping Use  . Vaping Use: Never used  Substance and Sexual Activity  . Alcohol use: No  . Drug use: No  . Sexual activity: Not on file  Other Topics Concern  . Not on file  Social History Narrative  . Not on file   Social Determinants of Health   Financial Resource Strain:   . Difficulty of Paying Living Expenses: Not on file  Food Insecurity:   . Worried About Charity fundraiser in the Last Year: Not on file  . Ran Out of Food in the Last Year: Not on file  Transportation Needs:   . Lack of Transportation (Medical): Not on file  . Lack of Transportation (Non-Medical): Not on file  Physical Activity:   . Days of Exercise per Week: Not on file  . Minutes of Exercise per Session: Not on file  Stress:   . Feeling of  Stress : Not on file  Social Connections:   . Frequency of Communication with Friends and Family: Not on file  . Frequency of Social Gatherings with Friends and Family: Not on file  . Attends Religious Services: Not on file  . Active Member of Clubs or Organizations: Not on file  . Attends Archivist Meetings: Not on file  . Marital Status: Not on file  Intimate Partner Violence:   . Fear of Current or Ex-Partner: Not on file  . Emotionally Abused: Not on file  . Physically Abused: Not on file  . Sexually Abused: Not on file     Review of Systems    General:  No chills, fever, night sweats or weight changes.  Cardiovascular:  No chest pain, dyspnea on exertion, edema, orthopnea, palpitations, paroxysmal  nocturnal dyspnea. Dermatological: No rash, lesions/masses Respiratory: No cough, dyspnea Urologic: No hematuria, dysuria Abdominal:   No nausea, vomiting, diarrhea, bright red blood per rectum, melena, or hematemesis Neurologic:  No visual changes, wkns, changes in mental status. All other systems reviewed and are otherwise negative except as noted above.  Physical Exam    VS:  BP 120/78   Pulse 70   Ht 5\' 7"  (1.702 m)   Wt 194 lb (88 kg)   SpO2 97%   BMI 30.38 kg/m  , BMI Body mass index is 30.38 kg/m. GEN: Well nourished, well developed, in no acute distress. HEENT: normal. Neck: Supple, no JVD, carotid bruits, or masses. Cardiac: RRR, no murmurs, rubs, or gallops. No clubbing, cyanosis, edema.  Radials/DP/PT 2+ and equal bilaterally.  Respiratory:  Respirations regular and unlabored, clear to auscultation bilaterally. GI: Soft, nontender, nondistended, BS + x 4. MS: no deformity or atrophy. Skin: warm and dry, no rash. Neuro:  Strength and sensation are intact. Psych: Normal affect.  Accessory Clinical Findings    Recent Labs: No results found for requested labs within last 8760 hours.   Recent Lipid Panel    Component Value Date/Time   CHOL 211 (H)  09/07/2018 1242   TRIG 147 09/07/2018 1242   HDL 62 09/07/2018 1242   CHOLHDL 3.4 09/07/2018 1242   LDLCALC 120 (H) 09/07/2018 1242    ECG personally reviewed by me today-none today.  EKG 03/01/2020 Sinus bradycardia 59 bpm  Echocardiogram 03/04/2019 IMPRESSIONS    1. The left ventricle has moderate-severely reduced systolic function,  with an ejection fraction of 30-35%. The cavity size was mildly dilated.  Left ventricular diastolic Doppler parameters are consistent with  pseudonormalization. Left ventricular  diffuse hypokinesis.  2. The right ventricle has normal systolic function. The cavity was  normal.  3. Left atrial size was moderately dilated.  4. The mitral valve is abnormal. Mild thickening of the mitral valve  leaflet.  5. The tricuspid valve is grossly normal.  6. The aortic valve is tricuspid. Mild thickening of the aortic valve.  Aortic valve regurgitation is mild by color flow Doppler. No stenosis of  the aortic valve.  7. The aorta is abnormal in size and structure.  8. There is mild dilatation of the aortic root measuring 40 mm.  9. Moderate to severe global reduction in LV systolic function; mild LVE;  moderate diastolic dysfunction; moderate LAE; mildy dilated aortic root;  mild AI; GLS-12.3%. Note cannot exclude apical thrombus; suggest FU  limited study with definity to furter  assess.  Assessment & Plan   1.  Combined systolic and diastolic CHF-no increased work of breathing or activity intolerance.  Echocardiogram 8/20 showed LVEF of 30-35%.  Continues to be fairly physically active. Continue metoprolol, Entresto Heart healthy low-sodium diet-salty 6 given Increase physical activity as tolerated  Coronary artery disease-no chest pain today.  No recent events of chest discomfort, activity intolerance.  Left heart cath 9/19 nonobstructive CAD. Continue amlodipine, Eliquis, Zetia, metoprolol Heart healthy low-sodium diet-salty 6  given Increase physical activity as tolerated  Essential hypertension-BP today 120/78.  Well-controlled at home Continue amlodipine, metoprolol Heart healthy low-sodium diet-salty 6 given Increase physical activity as tolerated  Hyperlipidemia-LDL 120 on 09/07/2018 Continue Zetia Heart healthy low-sodium high-fiber diet Increase physical activity as tolerated Repeat lipid panel  Paroxysmal atrial fibrillation/prior LV thrombus 2015 -heart rate today 70.  No recent episodes of heart palpitations/flutters Continue apixaban, metoprolol Heart healthy low-sodium diet-salty 6 given Increase physical activity  as tolerated  Preoperative cardiovascular evaluation-     Primary Cardiologist: Juan Dresser, MD  Chart reviewed as part of pre-operative protocol coverage. Given past medical history and time since last visit, based on ACC/AHA guidelines, Tennis A Rea would be at acceptable risk for the planned procedure without further cardiovascular testing.   His RCRI is a class II risk, 0.9% risk of major cardiac event.  He is able to complete greater than 4 METS of physical activity.  I will route this recommendation to the requesting party via Epic fax function and remove from pre-op pool.  Please call with questions.   Disposition: Follow-up with Dr. [name] in 1 year.  Jossie Ng. Felix Pratt NP-C    03/16/2020, 8:50 AM [address] Office [phone] Fax [phone]  Notice: This dictation was prepared with Dragon dictation along with smaller phrase technology. Any transcriptional errors that result from this process are unintentional and may not be corrected upon review.

## 2020-03-16 ENCOUNTER — Ambulatory Visit: Payer: PPO | Admitting: General Practice

## 2020-03-16 ENCOUNTER — Other Ambulatory Visit: Payer: Self-pay

## 2020-03-16 ENCOUNTER — Encounter: Payer: Self-pay | Admitting: General Practice

## 2020-03-16 VITALS — BP 120/78 | HR 70 | Ht 67.0 in | Wt 194.0 lb

## 2020-03-16 DIAGNOSIS — I2510 Atherosclerotic heart disease of native coronary artery without angina pectoris: Secondary | ICD-10-CM

## 2020-03-16 DIAGNOSIS — I5042 Chronic combined systolic (congestive) and diastolic (congestive) heart failure: Secondary | ICD-10-CM | POA: Diagnosis not present

## 2020-03-16 DIAGNOSIS — Z01810 Encounter for preprocedural cardiovascular examination: Secondary | ICD-10-CM | POA: Diagnosis not present

## 2020-03-16 DIAGNOSIS — I480 Paroxysmal atrial fibrillation: Secondary | ICD-10-CM

## 2020-03-16 DIAGNOSIS — I10 Essential (primary) hypertension: Secondary | ICD-10-CM | POA: Diagnosis not present

## 2020-03-16 DIAGNOSIS — E7800 Pure hypercholesterolemia, unspecified: Secondary | ICD-10-CM

## 2020-03-16 NOTE — Patient Instructions (Signed)
Medication Instructions:  The current medical regimen is effective;  continue present plan and medications as directed. Please refer to the Current Medication list given to you today.  *If you need a refill on your cardiac medications before your next appointment, please call your pharmacy*  Special Instructions PLEASE READ AND FOLLOW SALTY 6-ATTACHED  MAY USE DICLOFENAC GEL FOR HIP PAIN-THIS IS OVER THE COUNTER.  Follow-Up: Your next appointment:  12 month(s) Please call our office 2 months in advance to schedule this appointment In Person with You may see Buford Dresser, MD Coletta Memos, FNP-C or one of the following Advanced Practice Providers on your designated Care Team:  Rosaria Ferries, PA-C  Jory Sims, DNP, ANP Cadence Kathlen Mody, PA-C  At [facility], you and your health needs are our priority.  As part of our continuing mission to provide you with exceptional heart care, we have created designated Provider Care Teams.  These Care Teams include your primary Cardiologist (physician) and Advanced Practice Providers (APPs -  Physician Assistants and Nurse Practitioners) who all work together to provide you with the care you need, when you need it.

## 2020-03-17 DIAGNOSIS — G4733 Obstructive sleep apnea (adult) (pediatric): Secondary | ICD-10-CM | POA: Diagnosis not present

## 2020-03-29 ENCOUNTER — Telehealth: Payer: Self-pay | Admitting: *Deleted

## 2020-03-29 NOTE — Telephone Encounter (Signed)
    Request for surgical clearance:  1. What type of surgery is being performed? EXTRACTION OF ONE TOOTH WITH BONE GRAFT    2. When is this surgery scheduled? TBD   3. What type of clearance is required (medical clearance vs. Pharmacy clearance to hold med vs. Both)? BOHT  4. Are there any medications that need to be held prior to surgery and how long?ELIQUIS   5. Practice name and name of physician performing surgery? [facility]    6. What is the office phone number? [phone]   7.   What is the office fax number? [phone]  8.   Anesthesia type (None, local, MAC, general) ? GENERAL ANESTHESIA

## 2020-03-29 NOTE — Telephone Encounter (Signed)
° °  Primary Cardiologist: Buford Dresser, MD  Chart reviewed as part of pre-operative protocol coverage. The patient has history of PAF, chronic combined CHF, LV thrombus, HTN, HLD, depression. Last echo 02/2019 with LVEF 30-35%, cannot exclude apical thrombus -per result note Dr. [name] recommended continued blood thinner, and did not feel repeat study indicated at that time. He recently seen by Coletta Memos, NP on 03/16/20 for formal pre-operative clearance at which time he was doing well. Although this was for a different procedure, the clearance would still apply here as well. I confirmed with the patient he has not had any new cardiac symptoms since that visit. Given past medical history and time since last visit, based on ACC/AHA guidelines, Lory A Midgley would be at acceptable risk for the planned procedure without further cardiovascular testing. Based on available records, no clinical indication noted for SBE ppx.  Will route to pharm for input on anticoag.  Charlie Pitter, PA-C 03/29/2020, 1:04 PM

## 2020-03-29 NOTE — Telephone Encounter (Signed)
   Primary Cardiologist: Buford Dresser, MD  Chart reviewed as part of pre-operative protocol coverage. Given past medical history and clearance discussion with patient, based on ACC/AHA guidelines, Osiel A Walkup would be at acceptable risk for the planned procedure without further cardiovascular testing.   Per office protocol, patient can hold Eliquis for 1 day prior to procedure. We typically advise that blood thinners be resumed as soon as felt safe by performing physician.  The patient was advised that if he develops new symptoms prior to surgery to contact our office to arrange for a follow-up visit, and he verbalized understanding.  I will route this recommendation to the requesting party via Epic fax function and remove from pre-op pool.  Please call with questions.  Charlie Pitter, PA-C 03/29/2020, 3:24 PM

## 2020-03-29 NOTE — Telephone Encounter (Addendum)
Patient with diagnosis of afib on Eliquis for anticoagulation.    Procedure: EXTRACTION OF ONE TOOTH WITH BONE GRAFT   Date of procedure: TBD  CHADS2-VASc score of  4 (CHF, HTN, AGE, CAD)  Patient has hx of LV thrombus  CrCl 61 ml/min  Per office protocol, patient can hold Eliquis for 1 day prior to procedure.

## 2020-03-31 DIAGNOSIS — M1611 Unilateral primary osteoarthritis, right hip: Secondary | ICD-10-CM | POA: Diagnosis not present

## 2020-04-17 DIAGNOSIS — G4733 Obstructive sleep apnea (adult) (pediatric): Secondary | ICD-10-CM | POA: Diagnosis not present

## 2020-04-19 ENCOUNTER — Telehealth: Payer: Self-pay | Admitting: *Deleted

## 2020-04-19 DIAGNOSIS — I5042 Chronic combined systolic (congestive) and diastolic (congestive) heart failure: Secondary | ICD-10-CM

## 2020-04-19 DIAGNOSIS — I480 Paroxysmal atrial fibrillation: Secondary | ICD-10-CM

## 2020-04-19 NOTE — Telephone Encounter (Signed)
Pt is agreeable to come in for lab work for pre op clearance. BMP, CBC have been entered and appt has been made for 9/27 for lab work top be done at [facility] [address] office. I will place labs under Dr. [name] as this is the pt's primary card.

## 2020-04-19 NOTE — Telephone Encounter (Signed)
Patient with diagnosis of afib on Eliquis for anticoagulation.    Procedure: Right total hip arthroplasty-anterior Date of procedure: 06/14/20  CHADS2-VASc score of  4 (CHF, HTN, AGE, CAD)  Patient has a history of LV thrombus  Would typically recommend holding anticoagulation doe 3 days prior to procedure. I would like Dr. [name] to comment however, do to pt history of LV thrombus.  Patient will also need updated BMP and CBC before final recommendation

## 2020-04-19 NOTE — Telephone Encounter (Signed)
While waiting on Dr. [name] to respond please have pt come in and have BMP and CBC done. Thanks.  This will help [place] give accurate recommendations.

## 2020-04-19 NOTE — Telephone Encounter (Signed)
   [facility] Pre-operative Risk Assessment    HEARTCARE STAFF: - Please ensure there is not already an duplicate clearance open for this procedure. - Under Visit Info/Reason for Call, type in Other and utilize the format Clearance MM/DD/YY or Clearance TBD. Do not use dashes or single digits. - If request is for dental extraction, please clarify the # of teeth to be extracted.  Request for surgical clearance:  1. What type of surgery is being performed? Right total hip arthroplasty-anterior   2. When is this surgery scheduled? 06-14-20   3. What type of clearance is required (medical clearance vs. Pharmacy clearance to hold med vs. Both)? both  4. Are there any medications that need to be held prior to surgery and how long?eliquis-need direction   5. Practice name and name of physician performing surgery? [facility]   6. What is the office phone number? [phone]   7.   What is the office fax number? [phone] attn kelly hancock  8.   Anesthesia type (None, local, MAC, general) ? choice   Fredia Beets 04/19/2020, 11:05 AM  _________________________________________________________________   (provider comments below)

## 2020-04-24 ENCOUNTER — Other Ambulatory Visit: Payer: Self-pay

## 2020-04-24 ENCOUNTER — Other Ambulatory Visit: Payer: PPO | Admitting: *Deleted

## 2020-04-24 DIAGNOSIS — I480 Paroxysmal atrial fibrillation: Secondary | ICD-10-CM

## 2020-04-24 DIAGNOSIS — I5042 Chronic combined systolic (congestive) and diastolic (congestive) heart failure: Secondary | ICD-10-CM

## 2020-04-24 LAB — BASIC METABOLIC PANEL
BUN/Creatinine Ratio: 16 (ref 10–24)
BUN: 19 mg/dL (ref 8–27)
CO2: 21 mmol/L (ref 20–29)
Calcium: 8.9 mg/dL (ref 8.6–10.2)
Chloride: 103 mmol/L (ref 96–106)
Creatinine, Ser: 1.22 mg/dL (ref 0.76–1.27)
GFR calc Af Amer: 70 mL/min/{1.73_m2} (ref >59)
GFR calc non Af Amer: 61 mL/min/{1.73_m2} (ref >59)
Glucose: 183 mg/dL — ABNORMAL HIGH (ref 65–99)
Potassium: 3.7 mmol/L (ref 3.5–5.2)
Sodium: 141 mmol/L (ref 134–144)

## 2020-04-24 LAB — CBC
Hematocrit: 47.6 % (ref 37.5–51.0)
Hemoglobin: 16.2 g/dL (ref 13.0–17.7)
MCH: 32 pg (ref 26.6–33.0)
MCHC: 34 g/dL (ref 31.5–35.7)
MCV: 94 fL (ref 79–97)
Platelets: 252 10*3/uL (ref 150–450)
RBC: 5.07 x10E6/uL (ref 4.14–5.80)
RDW: 12.4 % (ref 11.6–15.4)
WBC: 6.3 10*3/uL (ref 3.4–10.8)

## 2020-04-25 NOTE — Telephone Encounter (Signed)
Called patient twice and it sounded like the phone was picked up but all that I could hear was clicking sounds and no one speaking. I will try again later if the patient does not return the call first.

## 2020-04-25 NOTE — Telephone Encounter (Signed)
   Primary Cardiologist: Buford Dresser, MD  Chart reviewed as part of pre-operative protocol coverage. Given past medical history and time since last visit, based on ACC/AHA guidelines, Juan Montes would be at acceptable risk for the planned procedure without further cardiovascular testing. Patient was cleared for surgery during recent office visit on 03/16/2020. Per our clinical pharmacist, patient will need to hold Eliquis for 3 days prior to the surgery and restart as soon as possible after the procedure at the discretion of the surgeon.  The patient was advised that if he develops new symptoms prior to surgery to contact our office to arrange for a follow-up visit, and he verbalized understanding.  I will route this recommendation to the requesting party via Epic fax function and remove from pre-op pool.  Please call with questions.   [place], [place] 04/25/2020, 10:59 AM

## 2020-04-25 NOTE — Telephone Encounter (Signed)
For total hip, will need to hold anticoagulation. Possible LV thrombus noted >1 year ago, acceptable to hold anticoagulation for several days, restart when safe from a surgical perspective

## 2020-04-25 NOTE — Telephone Encounter (Signed)
crcl is 61 ml/min and CBC WNL. Recommendation remains at holding anticoagulation 3 days prior to procedure as long as Dr. [name] feels this is acceptable with his hx of LV thrombus

## 2020-04-27 DIAGNOSIS — E785 Hyperlipidemia, unspecified: Secondary | ICD-10-CM | POA: Diagnosis not present

## 2020-04-27 DIAGNOSIS — R7301 Impaired fasting glucose: Secondary | ICD-10-CM | POA: Diagnosis not present

## 2020-04-27 DIAGNOSIS — I10 Essential (primary) hypertension: Secondary | ICD-10-CM | POA: Diagnosis not present

## 2020-04-27 DIAGNOSIS — Z125 Encounter for screening for malignant neoplasm of prostate: Secondary | ICD-10-CM | POA: Diagnosis not present

## 2020-05-04 DIAGNOSIS — E785 Hyperlipidemia, unspecified: Secondary | ICD-10-CM | POA: Diagnosis not present

## 2020-05-04 DIAGNOSIS — I2510 Atherosclerotic heart disease of native coronary artery without angina pectoris: Secondary | ICD-10-CM | POA: Diagnosis not present

## 2020-05-04 DIAGNOSIS — I428 Other cardiomyopathies: Secondary | ICD-10-CM | POA: Diagnosis not present

## 2020-05-04 DIAGNOSIS — Z23 Encounter for immunization: Secondary | ICD-10-CM | POA: Diagnosis not present

## 2020-05-04 DIAGNOSIS — I4891 Unspecified atrial fibrillation: Secondary | ICD-10-CM | POA: Diagnosis not present

## 2020-05-04 DIAGNOSIS — Z0000 Encounter for general adult medical examination without abnormal findings: Secondary | ICD-10-CM | POA: Diagnosis not present

## 2020-05-04 DIAGNOSIS — R7301 Impaired fasting glucose: Secondary | ICD-10-CM | POA: Diagnosis not present

## 2020-05-04 DIAGNOSIS — D6869 Other thrombophilia: Secondary | ICD-10-CM | POA: Diagnosis not present

## 2020-05-04 DIAGNOSIS — I5020 Unspecified systolic (congestive) heart failure: Secondary | ICD-10-CM | POA: Diagnosis not present

## 2020-05-04 DIAGNOSIS — R5383 Other fatigue: Secondary | ICD-10-CM | POA: Diagnosis not present

## 2020-05-04 DIAGNOSIS — M25551 Pain in right hip: Secondary | ICD-10-CM | POA: Diagnosis not present

## 2020-05-04 DIAGNOSIS — I10 Essential (primary) hypertension: Secondary | ICD-10-CM | POA: Diagnosis not present

## 2020-05-17 DIAGNOSIS — G4733 Obstructive sleep apnea (adult) (pediatric): Secondary | ICD-10-CM | POA: Diagnosis not present

## 2020-05-19 DIAGNOSIS — Z1212 Encounter for screening for malignant neoplasm of rectum: Secondary | ICD-10-CM | POA: Diagnosis not present

## 2020-06-05 NOTE — Progress Notes (Addendum)
COVID Vaccine Completed:  x2 Date COVID Vaccine completed:  Februay 2021 COVID vaccine manufacturer: [employer]   PCP - Crist Infante, MD Cardiologist - Buford Dresser, MD  Cardiac clearance in Epic dated 04-25-20 by Almyra Deforest, PA  Chest x-ray -  EKG - 03-01-20 in Epic Stress Test - 04-01-18 in Epic ECHO - 03-04-19 in Epic Cardiac Cath - 04-15-18 in Epic Pacemaker/ICD device last checked: Cardiac MRI - 10-20-13 in Epic  Sleep Study - 03-30-2019, +sleep apnea CPAP - No  Fasting Blood Sugar -  Checks Blood Sugar _____ times a day  Blood Thinner Instructions:  Eliquis 5 mg. Hold 3 days prior per clearance dated 04-19-20.   Aspirin Instructions: Last Dose:  Anesthesia review: Afib, heart failure, cardiomyopathy, abnormal stress test, HTN.  EF 30-35%  Patient denies shortness of breath, fever, cough and chest pain at PAT appointment   Patient verbalized understanding of instructions that were given to them at the PAT appointment. Patient was also instructed that they will need to review over the PAT instructions again at home before surgery.

## 2020-06-05 NOTE — Patient Instructions (Addendum)
DUE TO COVID-19 ONLY ONE VISITOR IS ALLOWED TO COME WITH YOU AND STAY IN THE WAITING ROOM ONLY DURING PRE OP AND PROCEDURE.   IF YOU WILL BE ADMITTED INTO THE HOSPITAL YOU ARE ALLOWED ONE SUPPORT PERSON DURING VISITATION HOURS ONLY (10AM -8PM)   . The support person may change daily. . The support person must pass our screening, gel in and out, and wear a mask at all times, including in the patient's room. . Patients must also wear a mask when staff or their support person are in the room.   COVID SWAB TESTING MUST BE COMPLETED ON:   Saturday, 06-10-20 @ 10:05 AM   [address] [place], [place] [address]  (Must self quarantine after testing. Follow instructions on handout.)       Your procedure is scheduled on:   Wednesday, 06-14-20   Report to [facility] Main  Entrance   Report to admitting at 6:30 AM   Call this number if you have problems the morning of surgery [phone]   Do not eat food :After Midnight.   May have liquids until 5:30 AM  day of surgery  CLEAR LIQUID DIET  Foods Allowed                                                                     Foods Excluded  Water, Black Coffee and tea, regular and decaf              liquids that you cannot  Plain Jell-O in any flavor  (No red)                                    see through such as: Fruit ices (not with fruit pulp)                                      milk, soups, orange juice              Iced Popsicles (No red)                                      All solid food                                   Apple juices Sports drinks like Gatorade (No red) Lightly seasoned clear broth or consume(fat free) Sugar, honey syrup    Complete one Ensure drink the morning of surgery at 5:30 AM the day of surgery.   Oral Hygiene is also important to reduce your risk of infection.                                    Remember - BRUSH YOUR TEETH THE MORNING OF SURGERY WITH YOUR REGULAR TOOTHPASTE   Do NOT smoke after  Midnight   Take these medicines the morning of  surgery with A SIP OF WATER:  Amlodipine, Zetia, Metoprolol, Sertraline                            You may not have any metal on your body including  jewelry, and body piercings             Do not wear lotions, powders, perfumes/cologne, or deodorant             Men may shave face and neck.   Do not bring valuables to the hospital. [place].   Contacts, dentures or bridgework may not be worn into surgery.   Bring small overnight bag day of surgery.    Special Instructions: Bring a copy of your healthcare power of attorney and living will documents the day of surgery if you haven't scanned them in before.              Please read over the following fact sheets you were given: IF YOU HAVE QUESTIONS ABOUT YOUR PRE OP INSTRUCTIONS PLEASE CALL [phone]   [facility] - Preparing for Surgery Before surgery, you can play an important role.  Because skin is not sterile, your skin needs to be as free of germs as possible.  You can reduce the number of germs on your skin by washing with CHG (chlorahexidine gluconate) soap before surgery.  CHG is an antiseptic cleaner which kills germs and bonds with the skin to continue killing germs even after washing. Please DO NOT use if you have an allergy to CHG or antibacterial soaps.  If your skin becomes reddened/irritated stop using the CHG and inform your nurse when you arrive at Short Stay. Do not shave (including legs and underarms) for at least 48 hours prior to the first CHG shower.  You may shave your face/neck.  Please follow these instructions carefully:  1.  Shower with CHG Soap the night before surgery and the  morning of surgery.  2.  If you choose to wash your hair, wash your hair first as usual with your normal  shampoo.  3.  After you shampoo, rinse your hair and body thoroughly to remove the shampoo.                             4.  Use CHG as you would  any other liquid soap.  You can apply chg directly to the skin and wash.  Gently with a scrungie or clean washcloth.  5.  Apply the CHG Soap to your body ONLY FROM THE NECK DOWN.   Do   not use on face/ open                           Wound or open sores. Avoid contact with eyes, ears mouth and   genitals (private parts).                       Wash face,  Genitals (private parts) with your normal soap.             6.  Wash thoroughly, paying special attention to the area where your    surgery  will be performed.  7.  Thoroughly rinse your body with warm water from the neck down.  8.  DO NOT shower/wash with your normal  soap after using and rinsing off the CHG Soap.                9.  Pat yourself dry with a clean towel.            10.  Wear clean pajamas.            11.  Place clean sheets on your bed the night of your first shower and do not  sleep with pets. Day of Surgery : Do not apply any lotions/deodorants the morning of surgery.  Please wear clean clothes to the hospital/surgery center.  FAILURE TO FOLLOW THESE INSTRUCTIONS MAY RESULT IN THE CANCELLATION OF YOUR SURGERY  PATIENT SIGNATURE_________________________________  NURSE SIGNATURE__________________________________  ________________________________________________________________________   Juan Montes  An incentive spirometer is a tool that can help keep your lungs clear and active. This tool measures how well you are filling your lungs with each breath. Taking long deep breaths may help reverse or decrease the chance of developing breathing (pulmonary) problems (especially infection) following:  A long period of time when you are unable to move or be active. BEFORE THE PROCEDURE   If the spirometer includes an indicator to show your best effort, your nurse or respiratory therapist will set it to a desired goal.  If possible, sit up straight or lean slightly forward. Try not to slouch.  Hold the incentive  spirometer in an upright position. INSTRUCTIONS FOR USE  1. Sit on the edge of your bed if possible, or sit up as far as you can in bed or on a chair. 2. Hold the incentive spirometer in an upright position. 3. Breathe out normally. 4. Place the mouthpiece in your mouth and seal your lips tightly around it. 5. Breathe in slowly and as deeply as possible, raising the piston or the ball toward the top of the column. 6. Hold your breath for 3-5 seconds or for as long as possible. Allow the piston or ball to fall to the bottom of the column. 7. Remove the mouthpiece from your mouth and breathe out normally. 8. Rest for a few seconds and repeat Steps 1 through 7 at least 10 times every 1-2 hours when you are awake. Take your time and take a few normal breaths between deep breaths. 9. The spirometer may include an indicator to show your best effort. Use the indicator as a goal to work toward during each repetition. 10. After each set of 10 deep breaths, practice coughing to be sure your lungs are clear. If you have an incision (the cut made at the time of surgery), support your incision when coughing by placing a pillow or rolled up towels firmly against it. Once you are able to get out of bed, walk around indoors and cough well. You may stop using the incentive spirometer when instructed by your caregiver.  RISKS AND COMPLICATIONS  Take your time so you do not get dizzy or light-headed.  If you are in pain, you may need to take or ask for pain medication before doing incentive spirometry. It is harder to take a deep breath if you are having pain. AFTER USE  Rest and breathe slowly and easily.  It can be helpful to keep track of a log of your progress. Your caregiver can provide you with a simple table to help with this. If you are using the spirometer at home, follow these instructions: Reading IF:   You are having difficultly using the spirometer.  You  have trouble using the  spirometer as often as instructed.  Your pain medication is not giving enough relief while using the spirometer.  You develop fever of 100.5 F (38.1 C) or higher. SEEK IMMEDIATE MEDICAL CARE IF:   You cough up bloody sputum that had not been present before.  You develop fever of 102 F (38.9 C) or greater.  You develop worsening pain at or near the incision site. MAKE SURE YOU:   Understand these instructions.  Will watch your condition.  Will get help right away if you are not doing well or get worse. Document Released: 11/25/2006 Document Revised: 10/07/2011 Document Reviewed: 01/26/2007 ExitCare Patient Information 2014 [employer], [place].   ________________________________________________________________________  WHAT IS A BLOOD TRANSFUSION? Blood Transfusion Information  A transfusion is the replacement of blood or some of its parts. Blood is made up of multiple cells which provide different functions.  Red blood cells carry oxygen and are used for blood loss replacement.  White blood cells fight against infection.  Platelets control bleeding.  Plasma helps clot blood.  Other blood products are available for specialized needs, such as hemophilia or other clotting disorders. BEFORE THE TRANSFUSION  Who gives blood for transfusions?   Healthy volunteers who are fully evaluated to make sure their blood is safe. This is blood bank blood. Transfusion therapy is the safest it has ever been in the practice of medicine. Before blood is taken from a donor, a complete history is taken to make sure that person has no history of diseases nor engages in risky social behavior (examples are intravenous drug use or sexual activity with multiple partners). The donor's travel history is screened to minimize risk of transmitting infections, such as malaria. The donated blood is tested for signs of infectious diseases, such as HIV and hepatitis. The blood is then tested to be sure it is  compatible with you in order to minimize the chance of a transfusion reaction. If you or a relative donates blood, this is often done in anticipation of surgery and is not appropriate for emergency situations. It takes many days to process the donated blood. RISKS AND COMPLICATIONS Although transfusion therapy is very safe and saves many lives, the main dangers of transfusion include:   Getting an infectious disease.  Developing a transfusion reaction. This is an allergic reaction to something in the blood you were given. Every precaution is taken to prevent this. The decision to have a blood transfusion has been considered carefully by your caregiver before blood is given. Blood is not given unless the benefits outweigh the risks. AFTER THE TRANSFUSION  Right after receiving a blood transfusion, you will usually feel much better and more energetic. This is especially true if your red blood cells have gotten low (anemic). The transfusion raises the level of the red blood cells which carry oxygen, and this usually causes an energy increase.  The nurse administering the transfusion will monitor you carefully for complications. HOME CARE INSTRUCTIONS  No special instructions are needed after a transfusion. You may find your energy is better. Speak with your caregiver about any limitations on activity for underlying diseases you may have. SEEK MEDICAL CARE IF:   Your condition is not improving after your transfusion.  You develop redness or irritation at the intravenous (IV) site. SEEK IMMEDIATE MEDICAL CARE IF:  Any of the following symptoms occur over the next 12 hours:  Shaking chills.  You have a temperature by mouth above 102 F (38.9 C), not controlled  by medicine.  Chest, back, or muscle pain.  People around you feel you are not acting correctly or are confused.  Shortness of breath or difficulty breathing.  Dizziness and fainting.  You get a rash or develop hives.  You have  a decrease in urine output.  Your urine turns a dark color or changes to pink, red, or brown. Any of the following symptoms occur over the next 10 days:  You have a temperature by mouth above 102 F (38.9 C), not controlled by medicine.  Shortness of breath.  Weakness after normal activity.  The white part of the eye turns yellow (jaundice).  You have a decrease in the amount of urine or are urinating less often.  Your urine turns a dark color or changes to pink, red, or brown. Document Released: 07/12/2000 Document Revised: 10/07/2011 Document Reviewed: 02/29/2008 [facility] Patient Information 2014 [place], [place].  _______________________________________________________________________

## 2020-06-07 ENCOUNTER — Other Ambulatory Visit: Payer: Self-pay

## 2020-06-07 ENCOUNTER — Encounter (HOSPITAL_COMMUNITY)
Admission: RE | Admit: 2020-06-07 | Discharge: 2020-06-07 | Disposition: A | Discharge status: Home / Self Care | Payer: PPO | Source: Ambulatory Visit | Attending: Orthopedic Surgery | Admitting: Orthopedic Surgery

## 2020-06-07 ENCOUNTER — Encounter (HOSPITAL_COMMUNITY): Payer: Self-pay

## 2020-06-07 DIAGNOSIS — Z01818 Encounter for other preprocedural examination: Secondary | ICD-10-CM | POA: Insufficient documentation

## 2020-06-07 HISTORY — DX: Sleep apnea, unspecified: G47.30

## 2020-06-07 HISTORY — DX: Gastro-esophageal reflux disease without esophagitis: K21.9

## 2020-06-07 LAB — COMPREHENSIVE METABOLIC PANEL
ALT: 20 U/L (ref 0–44)
AST: 20 U/L (ref 15–41)
Albumin: 4.2 g/dL (ref 3.5–5.0)
Alkaline Phosphatase: 93 U/L (ref 38–126)
Anion gap: 9 (ref 5–15)
BUN: 18 mg/dL (ref 8–23)
CO2: 26 mmol/L (ref 22–32)
Calcium: 9.3 mg/dL (ref 8.9–10.3)
Chloride: 105 mmol/L (ref 98–111)
Creatinine, Ser: 0.97 mg/dL (ref 0.61–1.24)
GFR, Estimated: >60 mL/min (ref >60)
Glucose, Bld: 104 mg/dL — ABNORMAL HIGH (ref 70–99)
Potassium: 4.3 mmol/L (ref 3.5–5.1)
Sodium: 140 mmol/L (ref 135–145)
Total Bilirubin: 0.9 mg/dL (ref 0.3–1.2)
Total Protein: 7.1 g/dL (ref 6.5–8.1)

## 2020-06-07 LAB — CBC
HCT: 46.6 % (ref 39.0–52.0)
Hemoglobin: 15.9 g/dL (ref 13.0–17.0)
MCH: 32.4 pg (ref 26.0–34.0)
MCHC: 34.1 g/dL (ref 30.0–36.0)
MCV: 94.9 fL (ref 80.0–100.0)
Platelets: 165 10*3/uL (ref 150–400)
RBC: 4.91 MIL/uL (ref 4.22–5.81)
RDW: 12.1 % (ref 11.5–15.5)
WBC: 5.2 10*3/uL (ref 4.0–10.5)
nRBC: 0 % (ref 0.0–0.2)

## 2020-06-07 LAB — PROTIME-INR
INR: 1.1 (ref 0.8–1.2)
Prothrombin Time: 13.4 seconds (ref 11.4–15.2)

## 2020-06-07 LAB — SURGICAL PCR SCREEN
MRSA, PCR: NEGATIVE
Staphylococcus aureus: NEGATIVE

## 2020-06-07 LAB — APTT: aPTT: 31 seconds (ref 24–36)

## 2020-06-08 NOTE — Progress Notes (Signed)
Anesthesia Chart Review   Case: [id number] Date/Time: 06/14/20 0815   Procedure: TOTAL HIP ARTHROPLASTY ANTERIOR APPROACH (Right Hip)   Anesthesia type: Choice   Pre-op diagnosis: right hip osteoarthritis   Location: [facility] ROOM 10 / [facility]   Surgeons: Gaynelle Arabian, MD      DISCUSSION:69 y.o. former smoker with h/o HTN, a-fib (on Eliquis), sleep apnea, GERD, right hip OA scheduled for above procedure 06/14/20 with Dr. Gaynelle Arabian.   Per cardiology preoperative risk assessment 04/25/20, "Chart reviewed as part of pre-operative protocol coverage. Given past medical history and time since last visit, based on ACC/AHA guidelines, Mana A Kuehl would be at acceptable risk for the planned procedure without further cardiovascular testing. Patient was cleared for surgery during recent office visit on 03/16/2020. Per our clinical pharmacist, patient will need to hold Eliquis for 3 days prior to the surgery and restart as soon as possible after the procedure at the discretion of the surgeon. The patient was advised that if he develops new symptoms prior to surgery to contact our office to arrange for a follow-up visit, and he verbalized understanding."  Anticipate pt can proceed with planned procedure barring acute status change.   VS: BP 125/83   Pulse 93   Temp 36.9 C (Oral)   Resp 18   Ht 5\' 7"  (1.702 m)   Wt 89 kg   SpO2 98%   BMI 30.73 kg/m   PROVIDERS: Crist Infante, MD is PCP   Buford Dresser, MD is Cardiologist  LABS: Labs reviewed: Acceptable for surgery. (all labs ordered are listed, but only abnormal results are displayed)  Labs Reviewed  COMPREHENSIVE METABOLIC PANEL - Abnormal; Notable for the following components:      Result Value   Glucose, Bld 104 (*)    All other components within normal limits  SURGICAL PCR SCREEN  APTT  CBC  PROTIME-INR  TYPE AND SCREEN     IMAGES:   EKG: 03/01/20 Rate 59 bpm Sinus bradycardia Otherwise normal ECG HEART RATE  DECREASED SINCE 24-Aug-2019 Otherwise no significant change  CV: Echo 03/04/2019 IMPRESSIONS    1. The left ventricle has moderate-severely reduced systolic function,  with an ejection fraction of 30-35%. The cavity size was mildly dilated.  Left ventricular diastolic Doppler parameters are consistent with  pseudonormalization. Left ventricular  diffuse hypokinesis.  2. The right ventricle has normal systolic function. The cavity was  normal.  3. Left atrial size was moderately dilated.  4. The mitral valve is abnormal. Mild thickening of the mitral valve  leaflet.  5. The tricuspid valve is grossly normal.  6. The aortic valve is tricuspid. Mild thickening of the aortic valve.  Aortic valve regurgitation is mild by color flow Doppler. No stenosis of  the aortic valve.  7. The aorta is abnormal in size and structure.  8. There is mild dilatation of the aortic root measuring 40 mm.  9. Moderate to severe global reduction in LV systolic function; mild LVE;  moderate diastolic dysfunction; moderate LAE; mildy dilated aortic root;  mild AI; GLS-12.3%. Note cannot exclude apical thrombus; suggest FU  limited study with definity to furter  assess.   Cardiac Cath 04/15/18  Lat Ramus lesion is 40% stenosed. Otherwise minimal CAD.  There is mild left ventricular systolic dysfunction. The left ventricular ejection fraction is 45-50% by visual estimate.   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  This could potentially be the reason for his symptoms, but not anginal equivalent.  Recommendation: Discharge  home after bedrest and TR band removal.  Past Medical History:  Diagnosis Date  . A-fib ([place]) 09/20/2013  . Anxiety disorder 12/19/2009  . Arthritis of knee, degenerative 12/19/2009  . BP (high blood pressure) 12/19/2009  . Clinical depression 12/19/2009  . Depression   . 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  Elevated fasting blood sugar 12/19/2009  . Fatigue 12/19/2009  . GERD (gastroesophageal reflux disease)   . HLD (hyperlipidemia) 12/19/2009  . HTN (hypertension)   . Sleep apnea     Past Surgical History:  Procedure Laterality Date  . HERNIA REPAIR    . KNEE SURGERY Left   . LEFT HEART CATH AND CORONARY ANGIOGRAPHY N/A 04/15/2018   Procedure: LEFT HEART CATH AND CORONARY ANGIOGRAPHY;  Surgeon: Leonie Man, MD;  Location: [facility];  Service: Cardiovascular;  Laterality: N/A;  . TONSILLECTOMY    . WISDOM TOOTH EXTRACTION      MEDICATIONS: . acetaminophen (TYLENOL 8 HOUR ARTHRITIS PAIN) 650 MG CR tablet  . amLODipine (NORVASC) 5 MG tablet  . apixaban (ELIQUIS) 5 MG TABS tablet  . calcium carbonate (TUMS) 500 MG chewable tablet  . Cholecalciferol (VITAMIN D3) 2000 units TABS  . ENTRESTO 97-103 MG  . ezetimibe (ZETIA) 10 MG tablet  . LORazepam (ATIVAN) 0.5 MG tablet  . metoprolol succinate (TOPROL-XL) 100 MG 24 hr tablet  . sertraline (ZOLOFT) 100 MG tablet  . traMADol (ULTRAM) 50 MG tablet   No current facility-administered medications for this encounter.     Konrad Felix, PA-C [facility] Pre-Surgical Testing [phone]

## 2020-06-10 ENCOUNTER — Other Ambulatory Visit (HOSPITAL_COMMUNITY)
Admission: RE | Admit: 2020-06-10 | Discharge: 2020-06-10 | Disposition: A | Discharge status: Home / Self Care | Payer: PPO | Source: Ambulatory Visit | Attending: Orthopedic Surgery | Admitting: Orthopedic Surgery

## 2020-06-10 DIAGNOSIS — Z20822 Contact with and (suspected) exposure to covid-19: Secondary | ICD-10-CM | POA: Insufficient documentation

## 2020-06-10 DIAGNOSIS — Z01812 Encounter for preprocedural laboratory examination: Secondary | ICD-10-CM | POA: Diagnosis not present

## 2020-06-10 LAB — SARS CORONAVIRUS 2 (TAT 6-24 HRS): SARS Coronavirus 2: NEGATIVE

## 2020-06-11 ENCOUNTER — Other Ambulatory Visit: Payer: Self-pay | Admitting: Cardiology

## 2020-06-13 NOTE — H&P (Signed)
TOTAL HIP ADMISSION H&P  Patient is admitted for right total hip arthroplasty.  Subjective:  Chief Complaint: Right hip pain  HPI: Juan Montes, 69 y.o. male, has a history of pain and functional disability in the right hip due to arthritis and patient has failed non-surgical conservative treatments for greater than 12 weeks to include corticosteriod injections and activity modification. Onset of symptoms was gradual, starting several years ago with gradually worsening course since that time. The patient noted no past surgery on the right hip. Patient currently rates pain in the right hip at 8 out of 10 with activity. Patient has night pain, worsening of pain with activity and weight bearing and crepitus. 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 This condition presents safety issues increasing the risk of falls. There is no current active infection.  Patient Active Problem List   Diagnosis Date Noted  . Secondary hypercoagulable state ([place]) 08/24/2019  . Snores 03/30/2019  . NICM (nonischemic cardiomyopathy) ([place]) 03/30/2019  . Chronic combined systolic and diastolic heart failure ([place]) 08/27/2018  . Abnormal nuclear stress test 04/15/2018  . Atypical angina ([place]) 04/15/2018  . Dyspnea 10/19/2013  . Paroxysmal atrial fibrillation ([place]) 09/20/2013  . Screening for gout 09/20/2013  . Cough 08/09/2013  . Dizziness 10/16/2011  . 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 Influenza-like illness 08/16/2011  . Hand discomfort 11/16/2010  . Anxiety disorder 12/19/2009  . Blood in feces 12/19/2009  . Clinical depression 12/19/2009  . Encounter for long-term (current) use of other medications 12/19/2009  . Fatigue 12/19/2009  . Other and unspecified general psychiatric examination 12/19/2009  . HLD (hyperlipidemia) 12/19/2009  . Essential  hypertension 12/19/2009  . Elevated fasting blood sugar 12/19/2009  . Arthritis of knee, degenerative 12/19/2009    Past Medical History:  Diagnosis Date  . A-fib ([place]) 09/20/2013  . Anxiety disorder 12/19/2009  . Arthritis of knee, degenerative 12/19/2009  . BP (high blood pressure) 12/19/2009  . Clinical depression 12/19/2009  . Depression   . 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 Elevated fasting blood sugar 12/19/2009  . Fatigue 12/19/2009  . GERD (gastroesophageal reflux disease)   . HLD (hyperlipidemia) 12/19/2009  . HTN (hypertension)   . Sleep apnea     Past Surgical History:  Procedure Laterality Date  . HERNIA REPAIR    . KNEE SURGERY Left   . LEFT HEART CATH AND CORONARY ANGIOGRAPHY N/A 04/15/2018   Procedure: LEFT HEART CATH AND CORONARY ANGIOGRAPHY;  Surgeon: Leonie Man, MD;  Location: [facility];  Service: Cardiovascular;  Laterality: N/A;  . TONSILLECTOMY    . WISDOM TOOTH EXTRACTION      Prior to Admission medications   Medication Sig Start Date End Date Taking? Authorizing Provider  acetaminophen (TYLENOL 8 HOUR ARTHRITIS PAIN) 650 MG CR tablet Take 1,300 mg by mouth every 8 (eight) hours as needed for pain.    Yes [provider]  amLODipine (NORVASC) 5 MG tablet TAKE 1 TABLET(5 MG) BY MOUTH DAILY Patient taking differently: Take 5 mg by mouth daily.  07/14/19  Yes Buford Dresser, MD  apixaban (ELIQUIS) 5 MG TABS tablet Take 5 mg by mouth 2 (two) times daily.   Yes [provider]  calcium carbonate (TUMS) 500 MG chewable tablet Chew 1-2 tablets by mouth 3 (three) times daily as needed for indigestion  or heartburn.  12/19/09  Yes [provider]  Cholecalciferol (VITAMIN D3) 2000 units TABS Take 2,000 Units by mouth daily.   Yes [provider]  ENTRESTO 97-103 MG TAKE 1 TABLET BY MOUTH TWICE DAILY Patient taking differently: Take 1 tablet by mouth 2 (two)  times daily.  03/01/20  Yes Cleaver, Jossie Ng, NP  ezetimibe (ZETIA) 10 MG tablet Take 10 mg by mouth daily. 06/09/19  Yes [provider]  LORazepam (ATIVAN) 0.5 MG tablet Take 0.25-0.5 mg by mouth at bedtime as needed for sleep. 04/17/20  Yes [provider]  sertraline (ZOLOFT) 100 MG tablet Take 100 mg by mouth daily.  08/08/10  Yes [provider]  metoprolol succinate (TOPROL-XL) 100 MG 24 hr tablet TAKE 1 TABLET BY MOUTH DAILY. TAKE WITH OR IMMEDIATELY FOLLOWING A MEAL. 06/12/20   Buford Dresser, MD  traMADol (ULTRAM) 50 MG tablet Take 50 mg by mouth as needed.  Patient not taking: Reported on 06/02/2020 02/09/20   [provider]    Allergies  Allergen Reactions  . Celebrex [Celecoxib] Itching    Social History   Socioeconomic History  . Marital status: Married    Spouse name: Not on file  . Number of children: Not on file  . Years of education: Not on file  . Highest education level: Not on file  Occupational History  . Occupation: retired  Tobacco Use  . Smoking status: Former Research scientist (life sciences)  . Smokeless tobacco: Never Used  . Tobacco comment: Quit about 3-4 months ago the occasional cigar  Vaping Use  . Vaping Use: Never used  Substance and Sexual Activity  . Alcohol use: No  . Drug use: No  . Sexual activity: Not on file  Other Topics Concern  . Not on file  Social History Narrative  . Not on file   Social Determinants of Health   Financial Resource Strain:   . Difficulty of Paying Living Expenses: Not on file  Food Insecurity:   . Worried About Charity fundraiser in the Last Year: Not on file  . Ran Out of Food in the Last Year: Not on file  Transportation Needs:   . Lack of Transportation (Medical): Not on file  . Lack of Transportation (Non-Medical): Not on file  Physical Activity:   . Days of Exercise per Week: Not on file  . Minutes of Exercise per Session: Not on file  Stress:   . Feeling of Stress : Not on file    Social Connections:   . Frequency of Communication with Friends and Family: Not on file  . Frequency of Social Gatherings with Friends and Family: Not on file  . Attends Religious Services: Not on file  . Active Member of Clubs or Organizations: Not on file  . Attends Archivist Meetings: Not on file  . Marital Status: Not on file  Intimate Partner Violence:   . Fear of Current or Ex-Partner: Not on file  . Emotionally Abused: Not on file  . Physically Abused: Not on file  . Sexually Abused: Not on file      Tobacco Use: Medium Risk  . Smoking Tobacco Use: Former Smoker  . Smokeless Tobacco Use: Never Used   Social History   Substance and Sexual Activity  Alcohol Use No    Family History  Problem Relation Age of Onset  . Kidney Stones Mother   . Hypertension Father   . Heart attack Brother 45  had CABG  . Hypertension Brother     Review of Systems  Constitutional: Negative for chills and fever.  HENT: Negative for congestion, sore throat and tinnitus.   Eyes: Negative for double vision, photophobia and pain.  Respiratory: Negative for cough, shortness of breath and wheezing.   Cardiovascular: Negative for chest pain, palpitations and orthopnea.  Gastrointestinal: Negative for heartburn, nausea and vomiting.  Genitourinary: Negative for dysuria, frequency and urgency.  Musculoskeletal: Positive for joint pain.  Neurological: Negative for dizziness, weakness and headaches.     Objective:  Physical Exam: Well nourished and well developed.  General: Alert and oriented x3, cooperative and pleasant, no acute distress.  Head: normocephalic, atraumatic, neck supple.  Eyes: EOMI.  Respiratory: breath sounds clear in all fields, no wheezing, rales, or rhonchi. Cardiovascular: Regular rate and rhythm, no murmurs, gallops or rubs.  Abdomen: non-tender to palpation and soft, normoactive bowel sounds. Musculoskeletal:  Right Hip Exam:  The range of  motion: Flexion to 90 degrees, Internal Rotation to 0 degrees, External Rotation to 20 to 30 degrees, and abduction to 30 degrees with pain on any rotational maneuvers.  There is no tenderness over the greater trochanteric bursa.   Calves soft and nontender. Motor function intact in LE. Strength 5/5 LE bilaterally. Neuro: Distal pulses 2+. Sensation to light touch intact in LE.   Imaging Review Plain radiographs demonstrate severe degenerative joint disease of the right hip. The bone quality appears to be adequate for age and reported activity level.  Assessment/Plan:  End stage arthritis, right hip  The patient history, physical examination, clinical judgement of the provider and imaging studies are consistent with end stage degenerative joint disease of the right hip and total hip arthroplasty is deemed medically necessary. The treatment options including medical management, injection therapy, arthroscopy and arthroplasty were discussed at length. The risks and benefits of total hip arthroplasty were presented and reviewed. The risks due to aseptic loosening, infection, stiffness, dislocation/subluxation, thromboembolic complications and other imponderables were discussed. The patient acknowledged the explanation, agreed to proceed with the plan and consent was signed. Patient is being admitted for inpatient treatment for surgery, pain control, PT, OT, prophylactic antibiotics, VTE prophylaxis, progressive ambulation and ADLs and discharge planning.The patient is planning to be discharged home.   Patient's anticipated LOS is less than 2 midnights, meeting these requirements: - Lives within 1 hour of care - Has a competent adult at home to recover with post-op recover - NO history of  - Chronic pain requiring opioids  - Diabetes  - Coronary Artery Disease  - Heart failure  - Heart attack  - Stroke  - DVT/VTE  - Respiratory Failure/COPD  - Renal failure  - Anemia  - Advanced Liver  disease  Therapy Plans: HEP Disposition: Home with wife Planned DVT Prophylaxis: Eliquis 5 mg BID DME Needed: None PCP: Crist Infante, MD Cardiologist: Buford Dresser, MD (clearance received) TXA: IV Allergies: Celebrex (itching) Anesthesia Concerns: None BMI: 30.6 Last HgbA1c: Not diabetic Pharmacy: [facility])  Other: Tramadol ineffective, history atrial fibrillation  - Patient was instructed on what medications to stop prior to surgery. - Follow-up visit in 2 weeks with Dr. Wynelle Link - Begin physical therapy following surgery - Pre-operative lab work as pre-surgical testing - Prescriptions will be provided in hospital at time of discharge  Theresa Duty, PA-C Orthopedic Surgery [facility] [place]

## 2020-06-14 ENCOUNTER — Other Ambulatory Visit: Payer: Self-pay

## 2020-06-14 ENCOUNTER — Ambulatory Visit (HOSPITAL_COMMUNITY): Payer: PPO | Admitting: Physician Assistant

## 2020-06-14 ENCOUNTER — Ambulatory Visit (HOSPITAL_COMMUNITY): Payer: PPO | Admitting: Certified Registered Nurse Anesthetist

## 2020-06-14 ENCOUNTER — Ambulatory Visit (HOSPITAL_COMMUNITY): Payer: PPO

## 2020-06-14 ENCOUNTER — Encounter (HOSPITAL_COMMUNITY)
Admission: RE | Disposition: A | Discharge status: Home / Self Care | Payer: Self-pay | Source: Home / Self Care | Attending: Orthopedic Surgery

## 2020-06-14 ENCOUNTER — Ambulatory Visit (HOSPITAL_COMMUNITY)
Admission: RE | Admit: 2020-06-14 | Discharge: 2020-06-15 | Disposition: A | Discharge status: Home / Self Care | Payer: PPO | Attending: Orthopedic Surgery | Admitting: Orthopedic Surgery

## 2020-06-14 ENCOUNTER — Encounter (HOSPITAL_COMMUNITY): Payer: Self-pay | Admitting: Orthopedic Surgery

## 2020-06-14 DIAGNOSIS — G4730 Sleep apnea, unspecified: Secondary | ICD-10-CM | POA: Diagnosis not present

## 2020-06-14 DIAGNOSIS — Z419 Encounter for procedure for purposes other than remedying health state, unspecified: Secondary | ICD-10-CM

## 2020-06-14 DIAGNOSIS — Z96641 Presence of right artificial hip joint: Secondary | ICD-10-CM | POA: Diagnosis not present

## 2020-06-14 DIAGNOSIS — K219 Gastro-esophageal reflux disease without esophagitis: Secondary | ICD-10-CM | POA: Diagnosis not present

## 2020-06-14 DIAGNOSIS — Z87891 Personal history of nicotine dependence: Secondary | ICD-10-CM | POA: Insufficient documentation

## 2020-06-14 DIAGNOSIS — F329 Major depressive disorder, single episode, unspecified: Secondary | ICD-10-CM | POA: Insufficient documentation

## 2020-06-14 DIAGNOSIS — Z96649 Presence of unspecified artificial hip joint: Secondary | ICD-10-CM

## 2020-06-14 DIAGNOSIS — I428 Other cardiomyopathies: Secondary | ICD-10-CM | POA: Diagnosis not present

## 2020-06-14 DIAGNOSIS — M169 Osteoarthritis of hip, unspecified: Secondary | ICD-10-CM | POA: Diagnosis present

## 2020-06-14 DIAGNOSIS — M1611 Unilateral primary osteoarthritis, right hip: Secondary | ICD-10-CM | POA: Diagnosis not present

## 2020-06-14 DIAGNOSIS — Z7901 Long term (current) use of anticoagulants: Secondary | ICD-10-CM | POA: Insufficient documentation

## 2020-06-14 DIAGNOSIS — Z471 Aftercare following joint replacement surgery: Secondary | ICD-10-CM | POA: Diagnosis not present

## 2020-06-14 HISTORY — PX: TOTAL HIP ARTHROPLASTY: SHX124

## 2020-06-14 LAB — TYPE AND SCREEN
ABO/RH(D): O NEG
Antibody Screen: NEGATIVE

## 2020-06-14 LAB — ABO/RH: ABO/RH(D): O NEG

## 2020-06-14 SURGERY — ARTHROPLASTY, HIP, TOTAL, ANTERIOR APPROACH
Anesthesia: Spinal | Site: Hip | Laterality: Right

## 2020-06-14 MED ORDER — METHOCARBAMOL 1000 MG/10ML IJ SOLN
500.0000 mg | Freq: Four times a day (QID) | INTRAVENOUS | Status: DC | PRN
  Filled 2020-06-14: qty 5

## 2020-06-14 MED ORDER — ACETAMINOPHEN 325 MG PO TABS: 325.0000 mg | ORAL_TABLET | Freq: Four times a day (QID) | ORAL | Status: DC | PRN

## 2020-06-14 MED ORDER — AMLODIPINE BESYLATE 5 MG PO TABS
5.0000 mg | ORAL_TABLET | Freq: Every day | ORAL | Status: DC
  Administered 2020-06-15: 5 mg via ORAL
  Filled 2020-06-14: qty 1

## 2020-06-14 MED ORDER — CEFAZOLIN SODIUM-DEXTROSE 2-4 GM/100ML-% IV SOLN
2.0000 g | Freq: Four times a day (QID) | INTRAVENOUS | Status: AC
  Administered 2020-06-14 (×2): 2 g via INTRAVENOUS
  Filled 2020-06-14 (×2): qty 100

## 2020-06-14 MED ORDER — PROPOFOL 10 MG/ML IV BOLUS
INTRAVENOUS | Status: AC
  Filled 2020-06-14: qty 20

## 2020-06-14 MED ORDER — PHENYLEPHRINE HCL (PRESSORS) 10 MG/ML IV SOLN
INTRAVENOUS | Status: AC
  Filled 2020-06-14: qty 1

## 2020-06-14 MED ORDER — SODIUM CHLORIDE 0.9 % IV SOLN: INTRAVENOUS | Status: DC

## 2020-06-14 MED ORDER — MIDAZOLAM HCL 2 MG/2ML IJ SOLN
INTRAMUSCULAR | Status: AC
  Filled 2020-06-14: qty 2

## 2020-06-14 MED ORDER — CEFAZOLIN SODIUM-DEXTROSE 2-4 GM/100ML-% IV SOLN
2.0000 g | INTRAVENOUS | Status: AC
  Administered 2020-06-14: 2 g via INTRAVENOUS

## 2020-06-14 MED ORDER — DOCUSATE SODIUM 100 MG PO CAPS
100.0000 mg | ORAL_CAPSULE | Freq: Two times a day (BID) | ORAL | Status: DC
  Administered 2020-06-14 – 2020-06-15 (×2): 100 mg via ORAL
  Filled 2020-06-14 (×2): qty 1

## 2020-06-14 MED ORDER — PHENYLEPHRINE HCL-NACL 10-0.9 MG/250ML-% IV SOLN
INTRAVENOUS | Status: DC | PRN
  Administered 2020-06-14: 40 ug/min via INTRAVENOUS

## 2020-06-14 MED ORDER — PROPOFOL 10 MG/ML IV BOLUS
INTRAVENOUS | Status: DC | PRN
  Administered 2020-06-14: 20 mg via INTRAVENOUS
  Administered 2020-06-14: 10 mg via INTRAVENOUS
  Administered 2020-06-14: 20 mg via INTRAVENOUS

## 2020-06-14 MED ORDER — APIXABAN 2.5 MG PO TABS
2.5000 mg | ORAL_TABLET | Freq: Two times a day (BID) | ORAL | Status: DC
  Administered 2020-06-15: 2.5 mg via ORAL
  Filled 2020-06-14: qty 1

## 2020-06-14 MED ORDER — ONDANSETRON HCL 4 MG/2ML IJ SOLN
INTRAMUSCULAR | Status: DC | PRN
  Administered 2020-06-14: 4 mg via INTRAVENOUS

## 2020-06-14 MED ORDER — ONDANSETRON HCL 4 MG/2ML IJ SOLN
INTRAMUSCULAR | Status: AC
  Filled 2020-06-14: qty 2

## 2020-06-14 MED ORDER — EZETIMIBE 10 MG PO TABS
10.0000 mg | ORAL_TABLET | Freq: Every day | ORAL | Status: DC
  Administered 2020-06-15: 10 mg via ORAL
  Filled 2020-06-14: qty 1

## 2020-06-14 MED ORDER — TRANEXAMIC ACID-NACL 1000-0.7 MG/100ML-% IV SOLN
1000.0000 mg | INTRAVENOUS | Status: AC
  Administered 2020-06-14: 1000 mg via INTRAVENOUS

## 2020-06-14 MED ORDER — 0.9 % SODIUM CHLORIDE (POUR BTL) OPTIME
TOPICAL | Status: DC | PRN
  Administered 2020-06-14: 1000 mL

## 2020-06-14 MED ORDER — TRANEXAMIC ACID-NACL 1000-0.7 MG/100ML-% IV SOLN
INTRAVENOUS | Status: AC
  Filled 2020-06-14: qty 100

## 2020-06-14 MED ORDER — FENTANYL CITRATE (PF) 100 MCG/2ML IJ SOLN: 25.0000 ug | INTRAMUSCULAR | Status: DC | PRN

## 2020-06-14 MED ORDER — ACETAMINOPHEN 10 MG/ML IV SOLN
INTRAVENOUS | Status: AC
  Filled 2020-06-14: qty 100

## 2020-06-14 MED ORDER — CHLORHEXIDINE GLUCONATE 0.12 % MT SOLN
15.0000 mL | Freq: Once | OROMUCOSAL | Status: AC
  Administered 2020-06-14: 15 mL via OROMUCOSAL

## 2020-06-14 MED ORDER — MIDAZOLAM HCL 5 MG/5ML IJ SOLN
INTRAMUSCULAR | Status: DC | PRN
  Administered 2020-06-14: 2 mg via INTRAVENOUS

## 2020-06-14 MED ORDER — MAGNESIUM CITRATE PO SOLN: 1.0000 | Freq: Once | ORAL | Status: DC | PRN

## 2020-06-14 MED ORDER — CEFAZOLIN SODIUM-DEXTROSE 2-4 GM/100ML-% IV SOLN
INTRAVENOUS | Status: AC
  Filled 2020-06-14: qty 100

## 2020-06-14 MED ORDER — LACTATED RINGERS IV SOLN: INTRAVENOUS | Status: DC

## 2020-06-14 MED ORDER — ORAL CARE MOUTH RINSE: 15.0000 mL | Freq: Once | OROMUCOSAL | Status: AC

## 2020-06-14 MED ORDER — FENTANYL CITRATE (PF) 100 MCG/2ML IJ SOLN
INTRAMUSCULAR | Status: DC | PRN
  Administered 2020-06-14: 50 ug via INTRAVENOUS

## 2020-06-14 MED ORDER — FENTANYL CITRATE (PF) 100 MCG/2ML IJ SOLN
INTRAMUSCULAR | Status: AC
  Filled 2020-06-14: qty 2

## 2020-06-14 MED ORDER — POVIDONE-IODINE 10 % EX SWAB
2.0000 "application " | Freq: Once | CUTANEOUS | Status: AC
  Administered 2020-06-14: 2 via TOPICAL

## 2020-06-14 MED ORDER — ONDANSETRON HCL 4 MG/2ML IJ SOLN: 4.0000 mg | Freq: Four times a day (QID) | INTRAMUSCULAR | Status: DC | PRN

## 2020-06-14 MED ORDER — METOPROLOL SUCCINATE ER 50 MG PO TB24
100.0000 mg | ORAL_TABLET | Freq: Every day | ORAL | Status: DC
  Administered 2020-06-15: 100 mg via ORAL
  Filled 2020-06-14: qty 2

## 2020-06-14 MED ORDER — BUPIVACAINE IN DEXTROSE 0.75-8.25 % IT SOLN
INTRATHECAL | Status: DC | PRN
  Administered 2020-06-14: 1.8 mL via INTRATHECAL

## 2020-06-14 MED ORDER — PHENYLEPHRINE 40 MCG/ML (10ML) SYRINGE FOR IV PUSH (FOR BLOOD PRESSURE SUPPORT)
PREFILLED_SYRINGE | INTRAVENOUS | Status: DC | PRN
  Administered 2020-06-14 (×2): 80 ug via INTRAVENOUS
  Administered 2020-06-14: 120 ug via INTRAVENOUS
  Administered 2020-06-14: 80 ug via INTRAVENOUS

## 2020-06-14 MED ORDER — PROPOFOL 500 MG/50ML IV EMUL
INTRAVENOUS | Status: DC | PRN
  Administered 2020-06-14: 50 ug/kg/min via INTRAVENOUS

## 2020-06-14 MED ORDER — POLYETHYLENE GLYCOL 3350 17 G PO PACK: 17.0000 g | PACK | Freq: Every day | ORAL | Status: DC | PRN

## 2020-06-14 MED ORDER — DEXAMETHASONE SODIUM PHOSPHATE 10 MG/ML IJ SOLN
INTRAMUSCULAR | Status: AC
  Filled 2020-06-14: qty 1

## 2020-06-14 MED ORDER — MORPHINE SULFATE (PF) 2 MG/ML IV SOLN: 0.5000 mg | INTRAVENOUS | Status: DC | PRN

## 2020-06-14 MED ORDER — BUPIVACAINE-EPINEPHRINE (PF) 0.25% -1:200000 IJ SOLN
INTRAMUSCULAR | Status: AC
  Filled 2020-06-14: qty 30

## 2020-06-14 MED ORDER — PROPOFOL 500 MG/50ML IV EMUL
INTRAVENOUS | Status: AC
  Filled 2020-06-14: qty 50

## 2020-06-14 MED ORDER — HYDROCODONE-ACETAMINOPHEN 5-325 MG PO TABS
1.0000 | ORAL_TABLET | ORAL | Status: DC | PRN
  Administered 2020-06-14: 2 via ORAL
  Administered 2020-06-14: 1 via ORAL
  Filled 2020-06-14: qty 1
  Filled 2020-06-14: qty 2

## 2020-06-14 MED ORDER — WATER FOR IRRIGATION, STERILE IR SOLN
Status: DC | PRN
  Administered 2020-06-14: 2000 mL

## 2020-06-14 MED ORDER — MENTHOL 3 MG MT LOZG: 1.0000 | LOZENGE | OROMUCOSAL | Status: DC | PRN

## 2020-06-14 MED ORDER — ONDANSETRON HCL 4 MG PO TABS: 4.0000 mg | ORAL_TABLET | Freq: Four times a day (QID) | ORAL | Status: DC | PRN

## 2020-06-14 MED ORDER — ACETAMINOPHEN 10 MG/ML IV SOLN
1000.0000 mg | Freq: Four times a day (QID) | INTRAVENOUS | Status: DC
  Administered 2020-06-14: 1000 mg via INTRAVENOUS

## 2020-06-14 MED ORDER — HYDROCODONE-ACETAMINOPHEN 7.5-325 MG PO TABS
1.0000 | ORAL_TABLET | ORAL | Status: DC | PRN
  Administered 2020-06-14 – 2020-06-15 (×4): 2 via ORAL
  Filled 2020-06-14 (×4): qty 2

## 2020-06-14 MED ORDER — DEXAMETHASONE SODIUM PHOSPHATE 10 MG/ML IJ SOLN
10.0000 mg | Freq: Once | INTRAMUSCULAR | Status: AC
  Administered 2020-06-15: 10 mg via INTRAVENOUS
  Filled 2020-06-14: qty 1

## 2020-06-14 MED ORDER — SERTRALINE HCL 100 MG PO TABS
100.0000 mg | ORAL_TABLET | Freq: Every day | ORAL | Status: DC
  Administered 2020-06-15: 100 mg via ORAL
  Filled 2020-06-14: qty 1

## 2020-06-14 MED ORDER — BISACODYL 10 MG RE SUPP: 10.0000 mg | Freq: Every day | RECTAL | Status: DC | PRN

## 2020-06-14 MED ORDER — METOCLOPRAMIDE HCL 5 MG PO TABS: 5.0000 mg | ORAL_TABLET | Freq: Three times a day (TID) | ORAL | Status: DC | PRN

## 2020-06-14 MED ORDER — METOCLOPRAMIDE HCL 5 MG/ML IJ SOLN: 5.0000 mg | Freq: Three times a day (TID) | INTRAMUSCULAR | Status: DC | PRN

## 2020-06-14 MED ORDER — METHOCARBAMOL 500 MG PO TABS
500.0000 mg | ORAL_TABLET | Freq: Four times a day (QID) | ORAL | Status: DC | PRN
  Administered 2020-06-14 – 2020-06-15 (×2): 500 mg via ORAL
  Filled 2020-06-14 (×2): qty 1

## 2020-06-14 MED ORDER — LORAZEPAM 0.5 MG PO TABS
0.2500 mg | ORAL_TABLET | Freq: Every evening | ORAL | Status: DC | PRN
  Administered 2020-06-14: 0.5 mg via ORAL
  Filled 2020-06-14: qty 1

## 2020-06-14 MED ORDER — PHENOL 1.4 % MT LIQD: 1.0000 | OROMUCOSAL | Status: DC | PRN

## 2020-06-14 MED ORDER — SACUBITRIL-VALSARTAN 97-103 MG PO TABS
1.0000 | ORAL_TABLET | Freq: Two times a day (BID) | ORAL | Status: DC
  Administered 2020-06-14 – 2020-06-15 (×2): 1 via ORAL
  Filled 2020-06-14 (×2): qty 1

## 2020-06-14 MED ORDER — BUPIVACAINE-EPINEPHRINE (PF) 0.25% -1:200000 IJ SOLN
INTRAMUSCULAR | Status: DC | PRN
  Administered 2020-06-14: 30 mL

## 2020-06-14 MED ORDER — DEXAMETHASONE SODIUM PHOSPHATE 10 MG/ML IJ SOLN
8.0000 mg | Freq: Once | INTRAMUSCULAR | Status: AC
  Administered 2020-06-14: 8 mg via INTRAVENOUS

## 2020-06-14 SURGICAL SUPPLY — 45 items
BAG DECANTER FOR FLEXI CONT (MISCELLANEOUS) IMPLANT
BAG ZIPLOCK 12X15 (MISCELLANEOUS) IMPLANT
BALL HIP CERAMIC (Hips) ×1 IMPLANT
BLADE SAG 18X100X1.27 (BLADE) ×2 IMPLANT
COVER PERINEAL POST (MISCELLANEOUS) ×2 IMPLANT
COVER SURGICAL LIGHT HANDLE (MISCELLANEOUS) ×2 IMPLANT
COVER WAND RF STERILE (DRAPES) IMPLANT
CUP ACET PINNACLE SECTR 50MM (Hips) ×1 IMPLANT
DECANTER SPIKE VIAL GLASS SM (MISCELLANEOUS) ×2 IMPLANT
DRAPE STERI IOBAN 125X83 (DRAPES) ×2 IMPLANT
DRAPE U-SHAPE 47X51 STRL (DRAPES) ×4 IMPLANT
DRSG ADAPTIC 3X8 NADH LF (GAUZE/BANDAGES/DRESSINGS) ×2 IMPLANT
DRSG AQUACEL AG ADV 3.5X10 (GAUZE/BANDAGES/DRESSINGS) ×2 IMPLANT
DURAPREP 26ML APPLICATOR (WOUND CARE) ×2 IMPLANT
ELECT REM PT RETURN 15FT ADLT (MISCELLANEOUS) ×2 IMPLANT
EVACUATOR 1/8 PVC DRAIN (DRAIN) IMPLANT
GLOVE BIO SURGEON STRL SZ 6 (GLOVE) IMPLANT
GLOVE BIO SURGEON STRL SZ7 (GLOVE) IMPLANT
GLOVE BIO SURGEON STRL SZ8 (GLOVE) ×2 IMPLANT
GLOVE BIOGEL PI IND STRL 6.5 (GLOVE) IMPLANT
GLOVE BIOGEL PI IND STRL 7.0 (GLOVE) IMPLANT
GLOVE BIOGEL PI IND STRL 8 (GLOVE) ×1 IMPLANT
GLOVE BIOGEL PI INDICATOR 6.5 (GLOVE)
GLOVE BIOGEL PI INDICATOR 7.0 (GLOVE)
GLOVE BIOGEL PI INDICATOR 8 (GLOVE) ×1
GOWN STRL REUS W/TWL LRG LVL3 (GOWN DISPOSABLE) ×2 IMPLANT
GOWN STRL REUS W/TWL XL LVL3 (GOWN DISPOSABLE) IMPLANT
HIP BALL CERAMIC (Hips) ×2 IMPLANT
HOLDER FOLEY CATH W/STRAP (MISCELLANEOUS) ×2 IMPLANT
KIT TURNOVER KIT A (KITS) IMPLANT
LINER MARATHON 32 50 (Hips) ×2 IMPLANT
MANIFOLD NEPTUNE II (INSTRUMENTS) ×2 IMPLANT
PACK ANTERIOR HIP CUSTOM (KITS) ×2 IMPLANT
PENCIL SMOKE EVACUATOR COATED (MISCELLANEOUS) ×2 IMPLANT
PINNACLE SECTOR CUP 50MM (Hips) ×2 IMPLANT
STEM FEMORAL SZ6 HIGH ACTIS (Stem) ×2 IMPLANT
STRIP CLOSURE SKIN 1/2X4 (GAUZE/BANDAGES/DRESSINGS) ×4 IMPLANT
SUT ETHIBOND NAB CT1 #1 30IN (SUTURE) ×2 IMPLANT
SUT MNCRL AB 4-0 PS2 18 (SUTURE) ×2 IMPLANT
SUT STRATAFIX 0 PDS 27 VIOLET (SUTURE) ×2
SUT VIC AB 2-0 CT1 27 (SUTURE) ×2
SUT VIC AB 2-0 CT1 TAPERPNT 27 (SUTURE) ×2 IMPLANT
SUTURE STRATFX 0 PDS 27 VIOLET (SUTURE) ×1 IMPLANT
SYR 50ML LL SCALE MARK (SYRINGE) IMPLANT
TRAY FOLEY MTR SLVR 16FR STAT (SET/KITS/TRAYS/PACK) ×2 IMPLANT

## 2020-06-14 NOTE — Anesthesia Procedure Notes (Signed)
Spinal  Patient location during procedure: OR Start time: 06/14/2020 8:30 AM End time: 06/14/2020 9:34 AM Staffing Performed: resident/CRNA  Anesthesiologist: Freddrick March, MD Resident/CRNA: Montel Clock, CRNA Preanesthetic Checklist Completed: patient identified, IV checked, risks and benefits discussed, surgical consent, monitors and equipment checked, pre-op evaluation and timeout performed Spinal Block Patient position: sitting Prep: DuraPrep Patient monitoring: heart rate, cardiac monitor, continuous pulse ox and blood pressure Approach: midline Location: L3-4 Injection technique: single-shot Needle Needle type: Pencan  Needle gauge: 24 G Needle length: 10 cm Needle insertion depth: 7.5 cm Assessment Sensory level: T6

## 2020-06-14 NOTE — Evaluation (Signed)
Physical Therapy Evaluation Patient Details Name: Juan Montes MRN: [id number] DOB: February 12, 1951 Today's Date: 06/14/2020   History of Present Illness  Patient is 69 y.o. male s/p Rt THA anterior approach on 06/14/20 with PMH significant for HTN, HLD, GERD, depression, anxiety, A-fib, OA.    Clinical Impression  Juan Montes is a 69 y.o. male POD 0 s/p Rt THA. Patient reports independence with use of quad cane due to pain for mobility at baseline. Patient is now limited by functional impairments (see PT problem list below) and requires min assist for transfers and gait with RW. Patient was able to ambulate ~30 feet with RW and min assist. Patient instructed in exercise to facilitate ROM and circulation. Patient will benefit from continued skilled PT interventions to address impairments and progress towards PLOF. Acute PT will follow to progress mobility and stair training in preparation for safe discharge home.     Follow Up Recommendations Follow surgeon's recommendation for DC plan and follow-up therapies    Equipment Recommendations  3in1 (PT)    Recommendations for Other Services       Precautions / Restrictions Precautions Precautions: Fall Restrictions Weight Bearing Restrictions: No Other Position/Activity Restrictions: WBAT      Mobility  Bed Mobility Overal bed mobility: Needs Assistance Bed Mobility: Supine to Sit     Supine to sit: Min assist;HOB elevated     General bed mobility comments: Min assist required for raising trunk, cues for use of bed rail.    Transfers Overall transfer level: Needs assistance Equipment used: Rolling walker (2 wheeled) Transfers: Sit to/from Stand Sit to Stand: Min assist         General transfer comment: cues for safe hand placement with RW, assist required for power up and to steady in standing.  Ambulation/Gait Ambulation/Gait assistance: Min assist Gait Distance (Feet): 30 Feet Assistive device: Rolling walker (2  wheeled) Gait Pattern/deviations: Step-to pattern;Decreased stride length Gait velocity: decr   General Gait Details: pt with slight anterior lean and cues to weight shift into heels required to improve posture and balance. min assist to steady and manage RW throughout. slight buckling on Rt LE one time; suspect spinal block not fully worn off.  Stairs            Wheelchair Mobility    Modified Rankin (Stroke Patients Only)       Balance Overall balance assessment: Needs assistance Sitting-balance support: Feet supported Sitting balance-Leahy Scale: Good     Standing balance support: During functional activity;Bilateral upper extremity supported Standing balance-Leahy Scale: Poor Standing balance comment: reliant on external support                              Pertinent Vitals/Pain Pain Assessment: 0-10 Pain Score: 6  Pain Location: Rt hip Pain Descriptors / Indicators: Aching Pain Intervention(s): Limited activity within patient's tolerance;Monitored during session;Repositioned;Ice applied    Home Living Family/patient expects to be discharged to:: Private residence Living Arrangements: Spouse/significant other Available Help at Discharge: Family Type of Home: House Home Access: Stairs to enter Entrance Stairs-Rails: None Entrance Stairs-Number of Steps: 2 Home Layout: One level Home Equipment: Mining engineer - 2 wheels      Prior Function Level of Independence: Independent with assistive device(s)         Comments: using quad cane for mobility prior to surgery     Hand Dominance   Dominant Hand: Right    Extremity/Trunk  Assessment   Upper Extremity Assessment Upper Extremity Assessment: Overall WFL for tasks assessed    Lower Extremity Assessment Lower Extremity Assessment: Overall WFL for tasks assessed    Cervical / Trunk Assessment Cervical / Trunk Assessment: Normal  Communication   Communication: No difficulties   Cognition Arousal/Alertness: Awake/alert Behavior During Therapy: WFL for tasks assessed/performed Overall Cognitive Status: Within Functional Limits for tasks assessed                                        General Comments      Exercises Total Joint Exercises Ankle Circles/Pumps: AROM;Both;20 reps;Seated Heel Slides: AROM;Right;5 reps   Assessment/Plan    PT Assessment Patient needs continued PT services  PT Problem List Decreased strength;Decreased activity tolerance;Decreased range of motion;Decreased balance;Decreased mobility;Decreased knowledge of use of DME;Decreased knowledge of precautions       PT Treatment Interventions DME instruction;Gait training;Stair training;Functional mobility training;Therapeutic activities;Therapeutic exercise;Balance training;Patient/family education    PT Goals (Current goals can be found in the Care Plan section)  Acute Rehab PT Goals Patient Stated Goal: recover independence PT Goal Formulation: With patient/family Time For Goal Achievement: 06/21/20 Potential to Achieve Goals: Good    Frequency 7X/week   Barriers to discharge        Co-evaluation               AM-PAC PT "6 Clicks" Mobility  Outcome Measure Help needed turning from your back to your side while in a flat bed without using bedrails?: None Help needed moving from lying on your back to sitting on the side of a flat bed without using bedrails?: A Little Help needed moving to and from a bed to a chair (including a wheelchair)?: A Little Help needed standing up from a chair using your arms (e.g., wheelchair or bedside chair)?: A Little Help needed to walk in hospital room?: A Little Help needed climbing 3-5 steps with a railing? : A Lot 6 Click Score: 18    End of Session Equipment Utilized During Treatment: Gait belt Activity Tolerance: Patient tolerated treatment well Patient left: in chair;with call bell/phone within reach;with chair  alarm set;with family/visitor present Nurse Communication: Mobility status PT Visit Diagnosis: Muscle weakness (generalized) (M62.81);Difficulty in walking, not elsewhere classified (R26.2)    Time: 2536-6440 PT Time Calculation (min) (ACUTE ONLY): 24 min   Charges:   PT Evaluation $PT Eval Low Complexity: 1 Low PT Treatments $Gait Training: 8-22 mins        Verner Mould, DPT Acute Rehabilitation Services  Office [phone] Pager [phone]  06/14/2020 3:14 PM

## 2020-06-14 NOTE — Anesthesia Postprocedure Evaluation (Signed)
Anesthesia Post Note  Patient: Juan Montes  Procedure(s) Performed: TOTAL HIP ARTHROPLASTY ANTERIOR APPROACH (Right Hip)     Patient location during evaluation: PACU Anesthesia Type: Spinal Level of consciousness: oriented and awake and alert Pain management: pain level controlled Vital Signs Assessment: post-procedure vital signs reviewed and stable Respiratory status: spontaneous breathing, respiratory function stable and patient connected to nasal cannula oxygen Cardiovascular status: blood pressure returned to baseline and stable Postop Assessment: no headache, no backache and no apparent nausea or vomiting Anesthetic complications: no   No complications documented.  Last Vitals:  Vitals:   06/14/20 1251 06/14/20 1500  BP: 101/78 107/69  Pulse: 71 75  Resp: 16 17  Temp: 36.7 C 36.7 C  SpO2: 99% 95%    Last Pain:  Vitals:   06/14/20 1545  TempSrc:   PainSc: 0-No pain                 Diahn Waidelich L Ginnifer Creelman

## 2020-06-14 NOTE — Anesthesia Preprocedure Evaluation (Addendum)
Anesthesia Evaluation  Patient identified by MRN, date of birth, ID band Patient awake    Reviewed: Allergy & Precautions, NPO status , Patient's Chart, lab work & pertinent test results, reviewed documented beta blocker date and time   Airway Mallampati: I  TM Distance: >3 FB Neck ROM: Full    Dental  (+) Missing, Dental Advisory Given,    Pulmonary sleep apnea , former smoker,    Pulmonary exam normal breath sounds clear to auscultation       Cardiovascular hypertension, Pt. on home beta blockers and Pt. on medications Normal cardiovascular exam+ dysrhythmias Atrial Fibrillation  Rhythm:Regular Rate:Normal  TTE 2020 1. The left ventricle has moderate-severely reduced systolic function, with an ejection fraction of 30-35%. The cavity size was mildly dilated. Left ventricular diastolic Doppler parameters are consistent with pseudonormalization. Left ventricular diffuse hypokinesis.  2. The right ventricle has normal systolic function. The cavity was normal.  3. Left atrial size was moderately dilated.  4. The mitral valve is abnormal. Mild thickening of the mitral valve leaflet.  5. The tricuspid valve is grossly normal.  6. The aortic valve is tricuspid. Mild thickening of the aortic valve. Aortic valve regurgitation is mild by color flow Doppler. No stenosis of the aortic valve.  7. The aorta is abnormal in size and structure.  8. There is mild dilatation of the aortic root measuring 40 mm.  9. Moderate to severe global reduction in LV systolic function; mild LVE; moderate diastolic dysfunction; moderate LAE; mildy dilated aortic root; mild AI; GLS-12.3%. Note cannot exclude apical thrombus; suggest FU limited study with definity to furter  assess.   Stress Test 2019 The left ventricular ejection fraction is moderately decreased (30-44%). Nuclear stress EF: 43%. There was no ST segment deviation noted during  stress. Defect 1: There is a small defect of moderate severity present in the basal inferior location. This is a low risk study. Findings consistent with ischemia.  LHC 2019 Lat Ramus lesion is 40% stenosed. Otherwise minimal CAD. There is mild left ventricular systolic dysfunction. The left ventricular ejection fraction is 45-50% by visual estimate.    Neuro/Psych PSYCHIATRIC DISORDERS Anxiety Depression negative neurological ROS     GI/Hepatic Neg liver ROS, GERD  ,  Endo/Other  negative endocrine ROS  Renal/GU negative Renal ROS  negative genitourinary   Musculoskeletal  (+) Arthritis ,   Abdominal   Peds  Hematology negative hematology ROS (+)   Anesthesia Other Findings On eliquis, last dose 06/09/20 at 10am  Reproductive/Obstetrics                            Anesthesia Physical Anesthesia Plan  ASA: III  Anesthesia Plan: Spinal   Post-op Pain Management:    Induction:   PONV Risk Score and Plan: 1 and Treatment may vary due to age or medical condition  Airway Management Planned: Natural Airway  Additional Equipment:   Intra-op Plan:   Post-operative Plan:   Informed Consent: I have reviewed the patients History and Physical, chart, labs and discussed the procedure including the risks, benefits and alternatives for the proposed anesthesia with the patient or authorized representative who has indicated his/her understanding and acceptance.     Dental advisory given  Plan Discussed with: CRNA  Anesthesia Plan Comments:         Anesthesia Quick Evaluation

## 2020-06-14 NOTE — Op Note (Signed)
OPERATIVE REPORT- TOTAL HIP ARTHROPLASTY   PREOPERATIVE DIAGNOSIS: Osteoarthritis of the Right hip.   POSTOPERATIVE DIAGNOSIS: Osteoarthritis of the Right  hip.   PROCEDURE: Right total hip arthroplasty, anterior approach.   SURGEON: Gaynelle Arabian, MD   ASSISTANT: Theresa Duty, PA-C  ANESTHESIA:  Spinal  ESTIMATED BLOOD LOSS:-225 mL    DRAINS: Hemovac x1.   COMPLICATIONS: None   CONDITION: PACU - hemodynamically stable.   BRIEF CLINICAL NOTE: Juan Montes is a 69 y.o. male who has advanced end-  stage arthritis of their Right  hip with progressively worsening pain and  dysfunction.The patient has failed nonoperative management and presents for  total hip arthroplasty.   PROCEDURE IN DETAIL: After successful administration of spinal  anesthetic, the traction boots for the [facility] bed were placed on both  feet and the patient was placed onto the [facility] bed, boots placed into the leg  holders. The Right hip was then isolated from the perineum with plastic  drapes and prepped and draped in the usual sterile fashion. ASIS and  greater trochanter were marked and a oblique incision was made, starting  at about 1 cm lateral and 2 cm distal to the ASIS and coursing towards  the anterior cortex of the femur. The skin was cut with a 10 blade  through subcutaneous tissue to the level of the fascia overlying the  tensor fascia lata muscle. The fascia was then incised in line with the  incision at the junction of the anterior third and posterior 2/3rd. The  muscle was teased off the fascia and then the interval between the TFL  and the rectus was developed. The Hohmann retractor was then placed at  the top of the femoral neck over the capsule. The vessels overlying the  capsule were cauterized and the fat on top of the capsule was removed.  A Hohmann retractor was then placed anterior underneath the rectus  femoris to give exposure to the entire anterior capsule. A T-shaped   capsulotomy was performed. The edges were tagged and the femoral head  was identified.       Osteophytes are removed off the superior acetabulum.  The femoral neck was then cut in situ with an oscillating saw. Traction  was then applied to the left lower extremity utilizing the [employer]  traction. The femoral head was then removed. Retractors were placed  around the acetabulum and then circumferential removal of the labrum was  performed. Osteophytes were also removed. Reaming starts at 47 mm to  medialize and  Increased in 2 mm increments to 49 mm. We reamed in  approximately 40 degrees of abduction, 20 degrees anteversion. A 50 mm  pinnacle acetabular shell was then impacted in anatomic position under  fluoroscopic guidance with excellent purchase. We did not need to place  any additional dome screws. A 32 mm neutral + 4 marathon liner was then  placed into the acetabular shell.       The femoral lift was then placed along the lateral aspect of the femur  just distal to the vastus ridge. The leg was  externally rotated and capsule  was stripped off the inferior aspect of the femoral neck down to the  level of the lesser trochanter, this was done with electrocautery. The femur was lifted after this was performed. The  leg was then placed in an extended and adducted position essentially delivering the femur. We also removed the capsule superiorly and the piriformis from the piriformis  fossa to gain excellent exposure of the  proximal femur. Rongeur was used to remove some cancellous bone to get  into the lateral portion of the proximal femur for placement of the  initial starter reamer. The starter broaches was placed  the starter broach  and was shown to go down the center of the canal. Broaching  with the Actis system was then performed starting at size 0  coursing  Up to size 6. A size 6 had excellent torsional and rotational  and axial stability. The trial high offset neck was then placed   with a 32 + 5 trial head. The hip was then reduced. We confirmed that  the stem was in the canal both on AP and lateral x-rays. It also has excellent sizing. The hip was reduced with outstanding stability through full extension and full external rotation.. AP pelvis was taken and the leg lengths were measured and found to be equal. Hip was then dislocated again and the femoral head and neck removed. The  femoral broach was removed. Size 6 Actis stem with a high offset  neck was then impacted into the femur following native anteversion. Has  excellent purchase in the canal. Excellent torsional and rotational and  axial stability. It is confirmed to be in the canal on AP and lateral  fluoroscopic views. The 32 + 5 ceramic head was placed and the hip  reduced with outstanding stability. Again AP pelvis was taken and it  confirmed that the leg lengths were equal. The wound was then copiously  irrigated with saline solution and the capsule reattached and repaired  with Ethibond suture. 30 ml of .25% Bupivicaine was  injected into the capsule and into the edge of the tensor fascia lata as well as subcutaneous tissue. The fascia overlying the tensor fascia lata was then closed with a running #1 V-Loc. Subcu was closed with interrupted 2-0 Vicryl and subcuticular running 4-0 Monocryl. Incision was cleaned  and dried. Steri-Strips and a bulky sterile dressing applied. Hemovac  drain was hooked to suction and then the patient was awakened and transported to  recovery in stable condition.        Please note that a surgical assistant was a medical necessity for this procedure to perform it in a safe and expeditious manner. Assistant was necessary to provide appropriate retraction of vital neurovascular structures and to prevent femoral fracture and allow for anatomic placement of the prosthesis.  Gaynelle Arabian, M.D.

## 2020-06-14 NOTE — Discharge Instructions (Signed)
Frank Aluisio, MD Total Joint Specialist [facility] [address] [place], [place] [address] [phone]  ANTERIOR APPROACH TOTAL HIP REPLACEMENT POSTOPERATIVE DIRECTIONS     Hip Rehabilitation, Guidelines Following Surgery  The results of a hip operation are greatly improved after range of motion and muscle strengthening exercises. Follow all safety measures which are given to protect your hip. If any of these exercises cause increased pain or swelling in your joint, decrease the amount until you are comfortable again. Then slowly increase the exercises. Call your caregiver if you have problems or questions.   HOME CARE INSTRUCTIONS  . Remove items at home which could result in a fall. This includes throw rugs or furniture in walking pathways.   ICE to the affected hip as frequently as 20-30 minutes an hour and then as needed for pain and swelling. Continue to use ice on the hip for pain and swelling from surgery. You may notice swelling that will progress down to the foot and ankle. This is normal after surgery. Elevate the leg when you are not up walking on it.    Continue to use the breathing machine which will help keep your temperature down.  It is common for your temperature to cycle up and down following surgery, especially at night when you are not up moving around and exerting yourself.  The breathing machine keeps your lungs expanded and your temperature down.  DIET You may resume your previous home diet once your are discharged from the hospital.  DRESSING / WOUND CARE / SHOWERING . You have an adhesive waterproof bandage over the incision. Leave this in place until your first follow-up appointment. Once you remove this you will not need to place another bandage.  . You may begin showering 3 days following surgery, but do not submerge the incision under water.  ACTIVITY . For the first 3-5 days, it is important to rest and keep the operative  leg elevated. You should, as a general rule, rest for 50 minutes and walk/stretch for 10 minutes per hour. After 5 days, you may slowly increase activity as tolerated.  . Perform the exercises you were provided twice a day for about 15-20 minutes each session. Begin these 2 days following surgery. . Walk with your walker as instructed. Use the walker until you are comfortable transitioning to a cane. Walk with the cane in the opposite hand of the operative leg. You may discontinue the cane once you are comfortable and walking steadily. . Avoid periods of inactivity such as sitting longer than an hour when not asleep. This helps prevent blood clots.  . Do not drive a car for 6 weeks or until released by your surgeon.  . Do not drive while taking narcotics.  TED HOSE STOCKINGS Wear the elastic stockings on both legs for three weeks following surgery during the day. You may remove them at night while sleeping.  WEIGHT BEARING Weight bearing as tolerated with assist device (walker, cane, etc) as directed, use it as long as suggested by your surgeon or therapist, typically at least 4-6 weeks.  POSTOPERATIVE CONSTIPATION PROTOCOL Constipation - defined medically as fewer than three stools per week and severe constipation as less than one stool per week.  One of the most common issues patients have following surgery is constipation.  Even if you have a regular bowel pattern at home, your normal regimen is likely to be disrupted due to multiple reasons following surgery.  Combination of anesthesia, postoperative narcotics,   change in appetite and fluid intake all can affect your bowels.  In order to avoid complications following surgery, here are some recommendations in order to help you during your recovery period.  . Colace (docusate) - Pick up an over-the-counter form of Colace or another stool softener and take twice a day as long as you are requiring postoperative pain medications.  Take with a full  glass of water daily.  If you experience loose stools or diarrhea, hold the colace until you stool forms back up.  If your symptoms do not get better within 1 week or if they get worse, check with your doctor. . Dulcolax (bisacodyl) - Pick up over-the-counter and take as directed by the product packaging as needed to assist with the movement of your bowels.  Take with a full glass of water.  Use this product as needed if not relieved by Colace only.  . MiraLax (polyethylene glycol) - Pick up over-the-counter to have on hand.  MiraLax is a solution that will increase the amount of water in your bowels to assist with bowel movements.  Take as directed and can mix with a glass of water, juice, soda, coffee, or tea.  Take if you go more than two days without a movement.Do not use MiraLax more than once per day. Call your doctor if you are still constipated or irregular after using this medication for 7 days in a row.  If you continue to have problems with postoperative constipation, please contact the office for further assistance and recommendations.  If you experience "the worst abdominal pain ever" or develop nausea or vomiting, please contact the office immediatly for further recommendations for treatment.  ITCHING  If you experience itching with your medications, try taking only a single pain pill, or even half a pain pill at a time.  You can also use Benadryl over the counter for itching or also to help with sleep.   MEDICATIONS See your medication summary on the "After Visit Summary" that the nursing staff will review with you prior to discharge.  You may have some home medications which will be placed on hold until you complete the course of blood thinner medication.  It is important for you to complete the blood thinner medication as prescribed by your surgeon.  Continue your approved medications as instructed at time of discharge.  PRECAUTIONS If you experience chest pain or shortness of breath -  call 911 immediately for transfer to the hospital emergency department.  If you develop a fever greater that 101 F, purulent drainage from wound, increased redness or drainage from wound, foul odor from the wound/dressing, or calf pain - CONTACT YOUR SURGEON.                                                   FOLLOW-UP APPOINTMENTS Make sure you keep all of your appointments after your operation with your surgeon and caregivers. You should call the office at the above phone number and make an appointment for approximately two weeks after the date of your surgery or on the date instructed by your surgeon outlined in the "After Visit Summary".  RANGE OF MOTION AND STRENGTHENING EXERCISES  These exercises are designed to help you keep full movement of your hip joint. Follow your caregiver's or physical therapist's instructions. Perform all exercises about fifteen times, three   times per day or as directed. Exercise both hips, even if you have had only one joint replacement. These exercises can be done on a training (exercise) mat, on the floor, on a table or on a bed. Use whatever works the best and is most comfortable for you. Use music or television while you are exercising so that the exercises are a pleasant break in your day. This will make your life better with the exercises acting as a break in routine you can look forward to.  . Lying on your back, slowly slide your foot toward your buttocks, raising your knee up off the floor. Then slowly slide your foot back down until your leg is straight again.  . Lying on your back spread your legs as far apart as you can without causing discomfort.  . Lying on your side, raise your upper leg and foot straight up from the floor as far as is comfortable. Slowly lower the leg and repeat.  . Lying on your back, tighten up the muscle in the front of your thigh (quadriceps muscles). You can do this by keeping your leg straight and trying to raise your heel off the  floor. This helps strengthen the largest muscle supporting your knee.  . Lying on your back, tighten up the muscles of your buttocks both with the legs straight and with the knee bent at a comfortable angle while keeping your heel on the floor.   IF YOU ARE TRANSFERRED TO A SKILLED REHAB FACILITY If the patient is transferred to a skilled rehab facility following release from the hospital, a list of the current medications will be sent to the facility for the patient to continue.  When discharged from the skilled rehab facility, please have the facility set up the patient's Home Health Physical Therapy prior to being released. Also, the skilled facility will be responsible for providing the patient with their medications at time of release from the facility to include their pain medication, the muscle relaxants, and their blood thinner medication. If the patient is still at the rehab facility at time of the two week follow up appointment, the skilled rehab facility will also need to assist the patient in arranging follow up appointment in our office and any transportation needs.  MAKE SURE YOU:  . Understand these instructions.  . Get help right away if you are not doing well or get worse.    DENTAL ANTIBIOTICS:  In most cases prophylactic antibiotics for Dental procdeures after total joint surgery are not necessary.  Exceptions are as follows:  1. History of prior total joint infection  2. Severely immunocompromised (Organ Transplant, cancer chemotherapy, Rheumatoid biologic meds such as Humera)  3. Poorly controlled diabetes (A1C &gt; 8.0, blood glucose over 200)  If you have one of these conditions, contact your surgeon for an antibiotic prescription, prior to your dental procedure.    Pick up stool softner and laxative for home use following surgery while on pain medications. Do not submerge incision under water. Please use good hand washing techniques while changing dressing each  day. May shower starting three days after surgery. Please use a clean towel to pat the incision dry following showers. Continue to use ice for pain and swelling after surgery. Do not use any lotions or creams on the incision until instructed by your surgeon.  

## 2020-06-14 NOTE — Interval H&P Note (Signed)
History and Physical Interval Note:  06/14/2020 8:05 AM  Juan Montes  has presented today for surgery, with the diagnosis of right hip osteoarthritis.  The various methods of treatment have been discussed with the patient and family. After consideration of risks, benefits and other options for treatment, the patient has consented to  Procedure(s): TOTAL HIP ARTHROPLASTY ANTERIOR APPROACH (Right) as a surgical intervention.  The patient's history has been reviewed, patient examined, no change in status, stable for surgery.  I have reviewed the patient's chart and labs.  Questions were answered to the patient's satisfaction.     Pilar Plate Marlie Kuennen

## 2020-06-14 NOTE — Transfer of Care (Signed)
Immediate Anesthesia Transfer of Care Note  Patient: Juan Montes  Procedure(s) Performed: TOTAL HIP ARTHROPLASTY ANTERIOR APPROACH (Right Hip)  Patient Location: PACU  Anesthesia Type:Spinal  Level of Consciousness: drowsy and patient cooperative  Airway & Oxygen Therapy: Patient Spontanous Breathing and Patient connected to face mask oxygen  Post-op Assessment: Report given to RN and Post -op Vital signs reviewed and stable  Post vital signs: Reviewed and stable  Last Vitals:  Vitals Value Taken Time  BP 73/51 06/14/20 1004  Temp    Pulse 75 06/14/20 1004  Resp 13 06/14/20 1004  SpO2 100 % 06/14/20 1004  Vitals shown include unvalidated device data.  Last Pain:  Vitals:   06/14/20 0718  TempSrc: Oral  PainSc:       Patients Stated Pain Goal: 5 (85/63/14 9702)  Complications: No complications documented.

## 2020-06-15 ENCOUNTER — Encounter (HOSPITAL_COMMUNITY): Payer: Self-pay | Admitting: Orthopedic Surgery

## 2020-06-15 DIAGNOSIS — M1611 Unilateral primary osteoarthritis, right hip: Secondary | ICD-10-CM | POA: Diagnosis not present

## 2020-06-15 LAB — BASIC METABOLIC PANEL
Anion gap: 10 (ref 5–15)
BUN: 19 mg/dL (ref 8–23)
CO2: 24 mmol/L (ref 22–32)
Calcium: 8.3 mg/dL — ABNORMAL LOW (ref 8.9–10.3)
Chloride: 103 mmol/L (ref 98–111)
Creatinine, Ser: 1.11 mg/dL (ref 0.61–1.24)
GFR, Estimated: >60 mL/min (ref >60)
Glucose, Bld: 155 mg/dL — ABNORMAL HIGH (ref 70–99)
Potassium: 4.6 mmol/L (ref 3.5–5.1)
Sodium: 137 mmol/L (ref 135–145)

## 2020-06-15 LAB — CBC
HCT: 40.1 % (ref 39.0–52.0)
Hemoglobin: 13.2 g/dL (ref 13.0–17.0)
MCH: 32 pg (ref 26.0–34.0)
MCHC: 32.9 g/dL (ref 30.0–36.0)
MCV: 97.3 fL (ref 80.0–100.0)
Platelets: 178 K/uL (ref 150–400)
RBC: 4.12 MIL/uL — ABNORMAL LOW (ref 4.22–5.81)
RDW: 12.1 % (ref 11.5–15.5)
WBC: 12.9 K/uL — ABNORMAL HIGH (ref 4.0–10.5)
nRBC: 0 % (ref 0.0–0.2)

## 2020-06-15 MED ORDER — METHOCARBAMOL 500 MG PO TABS
500.0000 mg | ORAL_TABLET | Freq: Four times a day (QID) | ORAL | 0 refills | Status: DC | PRN
Start: 2020-06-15 — End: 2021-05-31

## 2020-06-15 MED ORDER — METHOCARBAMOL 500 MG PO TABS: 500.0000 mg | ORAL_TABLET | Freq: Four times a day (QID) | ORAL | 0 refills | Status: DC | PRN

## 2020-06-15 MED ORDER — HYDROCODONE-ACETAMINOPHEN 5-325 MG PO TABS
1.0000 | ORAL_TABLET | Freq: Four times a day (QID) | ORAL | 0 refills | Status: DC | PRN
Start: 2020-06-15 — End: 2020-09-06

## 2020-06-15 MED ORDER — HYDROCODONE-ACETAMINOPHEN 5-325 MG PO TABS
1.0000 | ORAL_TABLET | Freq: Four times a day (QID) | ORAL | 0 refills | Status: DC | PRN
Start: 2020-06-15 — End: 2020-06-15

## 2020-06-15 NOTE — Progress Notes (Signed)
Physical Therapy Treatment Patient Details Name: Juan Montes MRN: [id number] DOB: 02/06/1951 Today's Date: 06/15/2020    History of Present Illness Patient is 69 y.o. male s/p Rt THA anterior approach on 06/14/20 with PMH significant for HTN, HLD, GERD, depression, anxiety, A-fib, OA.    PT Comments    Pt motivated and with fair progress with mobility this am.  Pt hopeful for dc home this pm.  Therex program initiated.   Follow Up Recommendations  Follow surgeon's recommendation for DC plan and follow-up therapies     Equipment Recommendations  None recommended by PT (Pt says can borrow 3n1)    Recommendations for Other Services       Precautions / Restrictions Precautions Precautions: Fall Restrictions Weight Bearing Restrictions: No Other Position/Activity Restrictions: WBAT    Mobility  Bed Mobility Overal bed mobility: Needs Assistance Bed Mobility: Supine to Sit     Supine to sit: Min guard     General bed mobility comments: cues for sequence and use of belt to assist R LE  Transfers Overall transfer level: Needs assistance Equipment used: Rolling walker (2 wheeled) Transfers: Sit to/from Stand Sit to Stand: Min guard         General transfer comment: Steady assist with cues for LE management and use of UEs to self assist  Ambulation/Gait Ambulation/Gait assistance: Min assist;Min guard Gait Distance (Feet): 90 Feet Assistive device: Rolling walker (2 wheeled) Gait Pattern/deviations: Step-to pattern;Decreased stride length Gait velocity: decr   General Gait Details: Increased time with cues for posture, position from RW and sequence   Stairs             Wheelchair Mobility    Modified Rankin (Stroke Patients Only)       Balance Overall balance assessment: Needs assistance Sitting-balance support: Feet supported Sitting balance-Leahy Scale: Good     Standing balance support: During functional activity;Bilateral upper extremity  supported Standing balance-Leahy Scale: Fair                              Cognition Arousal/Alertness: Awake/alert Behavior During Therapy: WFL for tasks assessed/performed Overall Cognitive Status: Within Functional Limits for tasks assessed                                        Exercises Total Joint Exercises Ankle Circles/Pumps: AROM;Both;20 reps;Seated Quad Sets: AROM;Both;10 reps;Supine Heel Slides: Right;AAROM;20 reps;Supine Hip ABduction/ADduction: AAROM;Right;Supine;15 reps Long Arc Quad: AROM;Right;Other reps (comment);Seated (1 rep)    General Comments        Pertinent Vitals/Pain Pain Assessment: 0-10 Pain Score: 6  Pain Location: Rt hip Pain Descriptors / Indicators: Aching;Grimacing;Sore Pain Intervention(s): Limited activity within patient's tolerance;Monitored during session;Premedicated before session;Ice applied    Home Living                      Prior Function            PT Goals (current goals can now be found in the care plan section) Acute Rehab PT Goals Patient Stated Goal: recover independence PT Goal Formulation: With patient/family Time For Goal Achievement: 06/21/20 Potential to Achieve Goals: Good Progress towards PT goals: Progressing toward goals    Frequency    7X/week      PT Plan Current plan remains appropriate    Co-evaluation  AM-PAC PT "6 Clicks" Mobility   Outcome Measure  Help needed turning from your back to your side while in a flat bed without using bedrails?: A Little Help needed moving from lying on your back to sitting on the side of a flat bed without using bedrails?: A Little Help needed moving to and from a bed to a chair (including a wheelchair)?: A Little Help needed standing up from a chair using your arms (e.g., wheelchair or bedside chair)?: A Little Help needed to walk in hospital room?: A Little Help needed climbing 3-5 steps with a railing? :  A Little 6 Click Score: 18    End of Session Equipment Utilized During Treatment: Gait belt Activity Tolerance: Patient tolerated treatment well Patient left: in chair;with call bell/phone within reach;with chair alarm set;with family/visitor present Nurse Communication: Mobility status PT Visit Diagnosis: Muscle weakness (generalized) (M62.81);Difficulty in walking, not elsewhere classified (R26.2)     Time: 9798-9211 PT Time Calculation (min) (ACUTE ONLY): 34 min  Charges:  $Gait Training: 8-22 mins $Therapeutic Exercise: 8-22 mins                     [place] Pager [phone] Office [phone]    Loni Delbridge 06/15/2020, 8:49 AM

## 2020-06-15 NOTE — Progress Notes (Signed)
Physical Therapy Treatment Patient Details Name: Juan Montes MRN: [id number] DOB: 19-Mar-1951 Today's Date: 06/15/2020    History of Present Illness Patient is 69 y.o. male s/p Rt THA anterior approach on 06/14/20 with PMH significant for HTN, HLD, GERD, depression, anxiety, A-fib, OA.    PT Comments    Pt continues very motivated and wife present during session.  Pt up to ambulate in hall, up to bathroom for toileting and hygiene, negotiated stairs, reviewed lower body dressing, and reviewed HEP.  Pt eager for return home.   Follow Up Recommendations  Follow surgeon's recommendation for DC plan and follow-up therapies     Equipment Recommendations  None recommended by PT    Recommendations for Other Services       Precautions / Restrictions Precautions Precautions: Fall Restrictions Weight Bearing Restrictions: No Other Position/Activity Restrictions: WBAT    Mobility  Bed Mobility Overal bed mobility: Needs Assistance Bed Mobility: Supine to Sit;Sit to Supine     Supine to sit: Supervision Sit to supine: Supervision   General bed mobility comments: cues for sequence and use of belt to assist R LE  Transfers Overall transfer level: Needs assistance Equipment used: Rolling walker (2 wheeled) Transfers: Sit to/from Stand Sit to Stand: Min guard;Supervision         General transfer comment: cues for LE management and use of UEs to self assist  Ambulation/Gait Ambulation/Gait assistance: Min guard;Supervision Gait Distance (Feet): 90 Feet Assistive device: Rolling walker (2 wheeled) Gait Pattern/deviations: Step-to pattern;Decreased stride length Gait velocity: decr   General Gait Details: Increased time with cues for posture, position from RW and sequence   Stairs Stairs: Yes Stairs assistance: Min assist Stair Management: No rails;Step to pattern;Backwards;With walker Number of Stairs: 4 General stair comments: 2 steps twice with spouse assisting on  second attempt.  Cues for sequence and foot/RW placement   Wheelchair Mobility    Modified Rankin (Stroke Patients Only)       Balance Overall balance assessment: Needs assistance Sitting-balance support: Feet supported Sitting balance-Leahy Scale: Good     Standing balance support: During functional activity;Bilateral upper extremity supported Standing balance-Leahy Scale: Fair                              Cognition Arousal/Alertness: Awake/alert Behavior During Therapy: WFL for tasks assessed/performed Overall Cognitive Status: Within Functional Limits for tasks assessed                                        Exercises Total Joint Exercises Ankle Circles/Pumps: AROM;Both;20 reps;Seated Quad Sets: AROM;Both;10 reps;Supine Heel Slides: Right;AAROM;20 reps;Supine Hip ABduction/ADduction: AAROM;Right;Supine;15 reps Long Arc Quad: AROM;Right;Seated;10 reps    General Comments        Pertinent Vitals/Pain Pain Assessment: 0-10 Pain Score: 4  Pain Location: Rt hip Pain Descriptors / Indicators: Aching;Sore Pain Intervention(s): Limited activity within patient's tolerance;Monitored during session;Premedicated before session    Home Living                      Prior Function            PT Goals (current goals can now be found in the care plan section) Acute Rehab PT Goals Patient Stated Goal: recover independence PT Goal Formulation: With patient/family Time For Goal Achievement: 06/21/20 Potential to Achieve Goals: Good Progress towards  PT goals: Progressing toward goals    Frequency    7X/week      PT Plan Current plan remains appropriate    Co-evaluation              AM-PAC PT "6 Clicks" Mobility   Outcome Measure  Help needed turning from your back to your side while in a flat bed without using bedrails?: A Little Help needed moving from lying on your back to sitting on the side of a flat bed without  using bedrails?: A Little Help needed moving to and from a bed to a chair (including a wheelchair)?: A Little Help needed standing up from a chair using your arms (e.g., wheelchair or bedside chair)?: A Little Help needed to walk in hospital room?: A Little Help needed climbing 3-5 steps with a railing? : A Little 6 Click Score: 18    End of Session Equipment Utilized During Treatment: Gait belt Activity Tolerance: Patient tolerated treatment well Patient left: in chair;with call bell/phone within reach;with chair alarm set;with family/visitor present Nurse Communication: Mobility status PT Visit Diagnosis: Muscle weakness (generalized) (M62.81);Difficulty in walking, not elsewhere classified (R26.2)     Time: 5320-2334 PT Time Calculation (min) (ACUTE ONLY): 56 min  Charges:  $Gait Training: 8-22 mins $Therapeutic Exercise: 8-22 mins $Therapeutic Activity: 23-37 mins                     Debe Coder PT Acute Rehabilitation Services Pager [phone] Office [phone]    [facility] 06/15/2020, 4:38 PM

## 2020-06-15 NOTE — TOC Transition Note (Signed)
Transition of Care [facility] - CM/SW Discharge Note   Patient Details  Name: Juan Montes MRN: [id number] Date of Birth: 01-09-1951  [facility] of [facility] CM/SW Contact:  Lennart Pall, LCSW Phone Number: 06/15/2020, 10:50 AM   Clinical Narrative:    Met briefly with pt and confirming he has all needed DME. Plan HEP.  No TOC needs.   Final next level of care: Home/Self Care Barriers to Discharge: No Barriers Identified   Patient Goals and CMS Choice Patient states their goals for this hospitalization and ongoing recovery are:: go home      Discharge Placement                       Discharge Plan and Services                DME Arranged: N/A DME Agency: NA       HH Arranged: NA HH Agency: NA        Social Determinants of Health (SDOH) Interventions     Readmission Risk Interventions No flowsheet data found.

## 2020-06-15 NOTE — Progress Notes (Addendum)
   Subjective: 1 Day Post-Op Procedure(s) (LRB): TOTAL HIP ARTHROPLASTY ANTERIOR APPROACH (Right) Patient reports pain as mild.   Patient seen in rounds by Dr. Wynelle Link. Patient is well, and has had no acute complaints or problems. Patient denies chest pain, SOB, or calf pain. No events overnight.  We will continue therapy today.   Objective: Vital signs in last 24 hours: Temp:  [97.8 F (36.6 C)-98.5 F (36.9 C)] 98.5 F (36.9 C) (11/18 0558) Pulse Rate:  [54-95] 86 (11/18 0558) Resp:  [10-18] 16 (11/18 0558) BP: (71-120)/(52-92) 106/70 (11/18 0558) SpO2:  [94 %-100 %] 94 % (11/18 0558)  Intake/Output from previous day:  Intake/Output Summary (Last 24 hours) at 06/15/2020 0715 Last data filed at 06/15/2020 0518 Gross per 24 hour  Intake 3796.82 ml  Output 1995 ml  Net 1801.82 ml     Intake/Output this shift: No intake/output data recorded.  Labs: Recent Labs    06/15/20 0314  HGB 13.2   Recent Labs    06/15/20 0314  WBC 12.9*  RBC 4.12*  HCT 40.1  PLT 178   Recent Labs    06/15/20 0314  NA 137  K 4.6  CL 103  CO2 24  BUN 19  CREATININE 1.11  GLUCOSE 155*  CALCIUM 8.3*   No results for input(s): LABPT, INR in the last 72 hours.  Exam: General - Patient is Alert and Oriented Extremity - Neurologically intact Neurovascular intact Sensation intact distally Dorsiflexion/Plantar flexion intact Dressing - dressing C/D/I Motor Function - intact, moving foot and toes well on exam.   Past Medical History:  Diagnosis Date  . A-fib ([place]) 09/20/2013  . Anxiety disorder 12/19/2009  . Arthritis of knee, degenerative 12/19/2009  . BP (high blood pressure) 12/19/2009  . Clinical depression 12/19/2009  . Depression   . 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 Elevated fasting blood sugar 12/19/2009  . Fatigue 12/19/2009  . GERD (gastroesophageal reflux disease)   . HLD (hyperlipidemia) 12/19/2009  . HTN  (hypertension)   . Sleep apnea     Assessment/Plan: 1 Day Post-Op Procedure(s) (LRB): TOTAL HIP ARTHROPLASTY ANTERIOR APPROACH (Right) Principal Problem:   OA (osteoarthritis) of hip Active Problems:   Primary osteoarthritis of right hip  Estimated body mass index is 30.73 kg/m as calculated from the following:   Height as of this encounter: 5\' 7"  (1.702 m).   Weight as of this encounter: 89 kg. Advance diet Up with therapy D/C IV fluids  DVT Prophylaxis - resume Eliquis this AM.  Foot Pumps and TED hose Weight bearing as tolerated. Continue therapy.  Plan is to go Home after hospital stay with HEP. Patient to be seen today by physical therapy for two visits, and if meeting goals will plan for discharge this afternoon. Patient to follow up in office in two weeks.   The PDMP database was reviewed today prior to any opioid medications being prescribed to this patient.  Fenton Foy, PA-C Orthopedic Surgery 06/15/2020, 7:15 AM

## 2020-07-10 DIAGNOSIS — I4891 Unspecified atrial fibrillation: Secondary | ICD-10-CM | POA: Diagnosis not present

## 2020-07-10 DIAGNOSIS — I5020 Unspecified systolic (congestive) heart failure: Secondary | ICD-10-CM | POA: Diagnosis not present

## 2020-07-10 DIAGNOSIS — F329 Major depressive disorder, single episode, unspecified: Secondary | ICD-10-CM | POA: Diagnosis not present

## 2020-07-10 DIAGNOSIS — I2510 Atherosclerotic heart disease of native coronary artery without angina pectoris: Secondary | ICD-10-CM | POA: Diagnosis not present

## 2020-07-10 DIAGNOSIS — D6869 Other thrombophilia: Secondary | ICD-10-CM | POA: Diagnosis not present

## 2020-07-10 DIAGNOSIS — I10 Essential (primary) hypertension: Secondary | ICD-10-CM | POA: Diagnosis not present

## 2020-07-12 ENCOUNTER — Other Ambulatory Visit: Payer: Self-pay | Admitting: Cardiology

## 2020-07-18 DIAGNOSIS — Z471 Aftercare following joint replacement surgery: Secondary | ICD-10-CM | POA: Diagnosis not present

## 2020-07-18 DIAGNOSIS — Z96641 Presence of right artificial hip joint: Secondary | ICD-10-CM | POA: Diagnosis not present

## 2020-07-30 ENCOUNTER — Other Ambulatory Visit: Payer: Self-pay

## 2020-07-30 DIAGNOSIS — U071 COVID-19: Secondary | ICD-10-CM | POA: Diagnosis not present

## 2020-07-30 DIAGNOSIS — R509 Fever, unspecified: Secondary | ICD-10-CM | POA: Diagnosis not present

## 2020-07-30 DIAGNOSIS — Z5321 Procedure and treatment not carried out due to patient leaving prior to being seen by health care provider: Secondary | ICD-10-CM | POA: Insufficient documentation

## 2020-07-30 DIAGNOSIS — M791 Myalgia, unspecified site: Secondary | ICD-10-CM | POA: Diagnosis not present

## 2020-07-30 DIAGNOSIS — R059 Cough, unspecified: Secondary | ICD-10-CM | POA: Diagnosis present

## 2020-07-30 DIAGNOSIS — I509 Heart failure, unspecified: Secondary | ICD-10-CM | POA: Insufficient documentation

## 2020-07-30 NOTE — ED Triage Notes (Signed)
Pt came in with c/o Covid. Pt started having sx Thur. He has a cough, intermittent fever, body aches. He has hx of afib and CHF, and he states his doctor told him to come in. Pt took Tylenol one hour ago. HR 92. B/P 151/102

## 2020-07-31 ENCOUNTER — Emergency Department (HOSPITAL_COMMUNITY)
Admission: EM | Admit: 2020-07-31 | Discharge: 2020-07-31 | Disposition: A | Discharge status: Home / Self Care | Payer: PPO | Attending: Emergency Medicine | Admitting: Emergency Medicine

## 2020-08-22 DIAGNOSIS — U071 COVID-19: Secondary | ICD-10-CM | POA: Diagnosis not present

## 2020-08-22 DIAGNOSIS — I10 Essential (primary) hypertension: Secondary | ICD-10-CM | POA: Diagnosis not present

## 2020-08-22 DIAGNOSIS — I4891 Unspecified atrial fibrillation: Secondary | ICD-10-CM | POA: Diagnosis not present

## 2020-08-22 DIAGNOSIS — I2510 Atherosclerotic heart disease of native coronary artery without angina pectoris: Secondary | ICD-10-CM | POA: Diagnosis not present

## 2020-08-22 DIAGNOSIS — I5020 Unspecified systolic (congestive) heart failure: Secondary | ICD-10-CM | POA: Diagnosis not present

## 2020-08-22 DIAGNOSIS — R7301 Impaired fasting glucose: Secondary | ICD-10-CM | POA: Diagnosis not present

## 2020-09-05 NOTE — Progress Notes (Signed)
Primary Care Physician: Crist Infante, MD Primary Cardiologist: Dr [name] Primary Electrophysiologist: none Referring Physician: Dr Caren Hazy is a 70 y.o. male with a history of chronic systolic heart failure, HTN, OSA, HLD, prior LV thrombus with resolution (2015) and paroxysmal atrial fibrillation who presents for follow up in the [facility].  The patient was initially diagnosed with atrial fibrillation remotely at a work event and appeared to be related to stress/dental procedures. Patient was at his PCP and found to be in rate controlled afib along with symptoms of fatigue and chest discomfort. He was restarted on Eliquis on 01/21/19 for a CHADS2VASC score of 4. He denies significant alcohol use.  On follow up today, patient reports he has done well from a cardiac standpoint. He has recovered nicely from his hip replacement. He denies any heart racing or palpitations. He denies any bleeding issues on anticoagulation.   Today, he denies symptoms of palpitations, chest pain, shortness of breath, orthopnea, PND, lower extremity edema, dizziness, presyncope, syncope, bleeding, or neurologic sequela. The patient is tolerating medications without difficulties and is otherwise without complaint today.    Atrial Fibrillation Risk Factors:  he does have symptoms of sleep apnea.  He is compliant with CPAP. he does not have a history of rheumatic fever. he does have a history of alcohol use. The patient does not have a history of early familial atrial fibrillation or other arrhythmias.  he has a BMI of Body mass index is 30.26 kg/m.Marland Kitchen Filed Weights   09/06/20 0902  Weight: 87.6 kg    Family History  Problem Relation Age of Onset  . Kidney Stones Mother   . Hypertension Father   . Heart attack Brother 54       had CABG  . Hypertension Brother      Atrial Fibrillation Management history:  Previous antiarrhythmic drugs: none Previous  cardioversions: none Previous ablations: none CHADS2VASC score: 4 Anticoagulation history: Eliquis   Past Medical History:  Diagnosis Date  . A-fib ([place]) 09/20/2013  . Anxiety disorder 12/19/2009  . Arthritis of knee, degenerative 12/19/2009  . BP (high blood pressure) 12/19/2009  . Clinical depression 12/19/2009  . Depression   . 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 Elevated fasting blood sugar 12/19/2009  . Fatigue 12/19/2009  . GERD (gastroesophageal reflux disease)   . HLD (hyperlipidemia) 12/19/2009  . HTN (hypertension)   . Sleep apnea    Past Surgical History:  Procedure Laterality Date  . HERNIA REPAIR    . KNEE SURGERY Left   . LEFT HEART CATH AND CORONARY ANGIOGRAPHY N/A 04/15/2018   Procedure: LEFT HEART CATH AND CORONARY ANGIOGRAPHY;  Surgeon: Leonie Man, MD;  Location: [facility];  Service: Cardiovascular;  Laterality: N/A;  . TONSILLECTOMY    . TOTAL HIP ARTHROPLASTY Right 06/14/2020   Procedure: TOTAL HIP ARTHROPLASTY ANTERIOR APPROACH;  Surgeon: Gaynelle Arabian, MD;  Location: [facility];  Service: Orthopedics;  Laterality: Right;  . WISDOM TOOTH EXTRACTION      Current Outpatient Medications  Medication Sig Dispense Refill  . acetaminophen (TYLENOL) 650 MG CR tablet Take 1,300 mg by mouth every 8 (eight) hours as needed for pain.     Marland Kitchen amLODipine (NORVASC) 5 MG tablet TAKE 1 TABLET(5 MG) BY MOUTH DAILY 90 tablet 3  . apixaban (ELIQUIS) 5 MG TABS tablet Take 5 mg by mouth 2 (two) times daily.    Marland Kitchen  buPROPion (WELLBUTRIN XL) 150 MG 24 hr tablet Take 150 mg by mouth every morning.    . calcium carbonate (TUMS - DOSED IN MG ELEMENTAL CALCIUM) 500 MG chewable tablet Chew 1-2 tablets by mouth 3 (three) times daily as needed for indigestion or heartburn.     . Cholecalciferol (VITAMIN D3) 2000 units TABS Take 2,000 Units by mouth daily.    Marland Kitchen ENTRESTO 97-103 MG TAKE 1 TABLET BY MOUTH TWICE DAILY 60 tablet 6  .  ezetimibe (ZETIA) 10 MG tablet Take 10 mg by mouth daily.    Marland Kitchen LORazepam (ATIVAN) 0.5 MG tablet Take 0.25-0.5 mg by mouth at bedtime as needed for sleep.    . methocarbamol (ROBAXIN) 500 MG tablet Take 1 tablet (500 mg total) by mouth every 6 (six) hours as needed for muscle spasms. 40 tablet 0  . metoprolol succinate (TOPROL-XL) 100 MG 24 hr tablet TAKE 1 TABLET BY MOUTH DAILY. TAKE WITH OR IMMEDIATELY FOLLOWING A MEAL. 90 tablet 1  . sertraline (ZOLOFT) 100 MG tablet Take 100 mg by mouth daily.      No current facility-administered medications for this encounter.    Allergies  Allergen Reactions  . Atorvastatin     Other reaction(s): felt bad, draggy, rough.  . Celebrex [Celecoxib] Itching  . Pravachol [Pravastatin]     Other reaction(s): made him feel bad.    Social History   Socioeconomic History  . Marital status: Married    Spouse name: Not on file  . Number of children: Not on file  . Years of education: Not on file  . Highest education level: Not on file  Occupational History  . Occupation: retired  Tobacco Use  . Smoking status: Former Smoker    Types: Cigars  . Smokeless tobacco: Never Used  . Tobacco comment: Quit about 3-4 months ago the occasional cigar  Vaping Use  . Vaping Use: Never used  Substance and Sexual Activity  . Alcohol use: No  . Drug use: No  . Sexual activity: Not on file  Other Topics Concern  . Not on file  Social History Narrative  . Not on file   Social Determinants of Health   Financial Resource Strain: Not on file  Food Insecurity: Not on file  Transportation Needs: Not on file  Physical Activity: Not on file  Stress: Not on file  Social Connections: Not on file  Intimate Partner Violence: Not on file     ROS- All systems are reviewed and negative except as per the HPI above.  Physical Exam: Vitals:   09/06/20 0902  BP: 124/82  Pulse: 64  Weight: 87.6 kg  Height: 5\' 7"  (1.702 m)    GEN- The patient is well appearing  obese male, alert and oriented x 3 today.   HEENT-head normocephalic, atraumatic, sclera clear, conjunctiva pink, hearing intact, trachea midline. Lungs- Clear to ausculation bilaterally, normal work of breathing Heart- Regular rate and rhythm, no murmurs, rubs or gallops  GI- soft, NT, ND, + BS Extremities- no clubbing, cyanosis, or edema MS- no significant deformity or atrophy Skin- no rash or lesion Psych- euthymic mood, full affect Neuro- strength and sensation are intact   Wt Readings from Last 3 Encounters:  09/06/20 87.6 kg  06/14/20 89 kg  06/07/20 89 kg    EKG today demonstrates  SR Vent. rate 64 BPM PR interval 198 ms QRS duration 92 ms QT/QTc 426/439 ms  Echo 03/04/19 demonstrated   1. The left ventricle has moderate-severely reduced  systolic function, with an ejection fraction of 30-35%. The cavity size was mildly dilated. Left ventricular diastolic Doppler parameters are consistent with pseudonormalization. Left ventricular  diffuse hypokinesis.  2. The right ventricle has normal systolic function. The cavity was normal.  3. Left atrial size was moderately dilated.  4. The mitral valve is abnormal. Mild thickening of the mitral valve leaflet.  5. The tricuspid valve is grossly normal.  6. The aortic valve is tricuspid. Mild thickening of the aortic valve. Aortic valve regurgitation is mild by color flow Doppler. No stenosis of the aortic valve.  7. The aorta is abnormal in size and structure.  8. There is mild dilatation of the aortic root measuring 40 mm.  9. Moderate to severe global reduction in LV systolic function; mild LVE; moderate diastolic dysfunction; moderate LAE; mildy dilated aortic root; mild AI; GLS-12.3%. Note cannot exclude apical thrombus; suggest FU limited study with definity to furter  assess.  Epic records are reviewed at length today  Assessment and Plan:  1. Paroxsymal atrial fibrillation Patient appears to be maintaining SR.  Continue  Eliquis 5 mg BID Continue Toprol 50 mg BID  This patients CHA2DS2-VASc Score and unadjusted Ischemic Stroke Rate (% per year) is equal to 4.8 % stroke rate/year from a score of 4  Above score calculated as 1 point each if present [CHF, HTN, DM, Vascular=MI/PAD/Aortic Plaque, Age if 65-74, or Male] Above score calculated as 2 points each if present [Age > 75, or Stroke/TIA/TE]  2. OSA Patient reports compliance with CPAP therapy.  3. 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  No signs or symptoms of fluid overload.  4. HTN Stable, no changes today.  5. CAD Non obstructive on [facility] 03/2018.  No anginal symptoms.   Follow up with Dr [name] per recall. AF clinic in one year.    [facility] [address] [place], [place] [address] [phone] 09/06/2020 9:11 AM

## 2020-09-06 ENCOUNTER — Encounter (HOSPITAL_COMMUNITY): Payer: Self-pay | Admitting: Physician Assistant

## 2020-09-06 ENCOUNTER — Other Ambulatory Visit: Payer: Self-pay

## 2020-09-06 ENCOUNTER — Ambulatory Visit (HOSPITAL_COMMUNITY)
Admission: RE | Admit: 2020-09-06 | Discharge: 2020-09-06 | Disposition: A | Discharge status: Home / Self Care | Payer: PPO | Source: Ambulatory Visit | Attending: Physician Assistant | Admitting: Physician Assistant

## 2020-09-06 VITALS — BP 124/82 | HR 64 | Ht 67.0 in | Wt 193.2 lb

## 2020-09-06 DIAGNOSIS — G4733 Obstructive sleep apnea (adult) (pediatric): Secondary | ICD-10-CM | POA: Diagnosis not present

## 2020-09-06 DIAGNOSIS — E785 Hyperlipidemia, unspecified: Secondary | ICD-10-CM | POA: Insufficient documentation

## 2020-09-06 DIAGNOSIS — Z7901 Long term (current) use of anticoagulants: Secondary | ICD-10-CM | POA: Insufficient documentation

## 2020-09-06 DIAGNOSIS — I480 Paroxysmal atrial fibrillation: Secondary | ICD-10-CM | POA: Diagnosis not present

## 2020-09-06 DIAGNOSIS — I5040 Unspecified combined systolic (congestive) and diastolic (congestive) heart failure: Secondary | ICD-10-CM | POA: Diagnosis not present

## 2020-09-06 DIAGNOSIS — D6869 Other thrombophilia: Secondary | ICD-10-CM

## 2020-09-06 DIAGNOSIS — I2510 Atherosclerotic heart disease of native coronary artery without angina pectoris: Secondary | ICD-10-CM | POA: Insufficient documentation

## 2020-09-06 DIAGNOSIS — I110 Hypertensive heart disease with heart failure: Secondary | ICD-10-CM | POA: Diagnosis not present

## 2020-10-12 ENCOUNTER — Other Ambulatory Visit: Payer: Self-pay | Admitting: General Practice

## 2020-11-08 DIAGNOSIS — I428 Other cardiomyopathies: Secondary | ICD-10-CM | POA: Diagnosis not present

## 2020-11-08 DIAGNOSIS — R7301 Impaired fasting glucose: Secondary | ICD-10-CM | POA: Diagnosis not present

## 2020-11-08 DIAGNOSIS — D6869 Other thrombophilia: Secondary | ICD-10-CM | POA: Diagnosis not present

## 2020-11-08 DIAGNOSIS — I4891 Unspecified atrial fibrillation: Secondary | ICD-10-CM | POA: Diagnosis not present

## 2020-11-08 DIAGNOSIS — I10 Essential (primary) hypertension: Secondary | ICD-10-CM | POA: Diagnosis not present

## 2020-11-08 DIAGNOSIS — F329 Major depressive disorder, single episode, unspecified: Secondary | ICD-10-CM | POA: Diagnosis not present

## 2020-11-08 DIAGNOSIS — E785 Hyperlipidemia, unspecified: Secondary | ICD-10-CM | POA: Diagnosis not present

## 2020-11-08 DIAGNOSIS — M179 Osteoarthritis of knee, unspecified: Secondary | ICD-10-CM | POA: Diagnosis not present

## 2020-11-08 DIAGNOSIS — F419 Anxiety disorder, unspecified: Secondary | ICD-10-CM | POA: Diagnosis not present

## 2020-11-08 DIAGNOSIS — I2510 Atherosclerotic heart disease of native coronary artery without angina pectoris: Secondary | ICD-10-CM | POA: Diagnosis not present

## 2020-11-08 DIAGNOSIS — I5020 Unspecified systolic (congestive) heart failure: Secondary | ICD-10-CM | POA: Diagnosis not present

## 2020-11-08 NOTE — Progress Notes (Signed)
Primary Care Physician: Crist Infante, MD Primary Cardiologist: Dr [name] Primary Electrophysiologist: none Referring Physician: Dr Caren Hazy is a 70 y.o. male with a history of chronic systolic heart failure, HTN, OSA, HLD, prior LV thrombus with resolution (2015) and paroxysmal atrial fibrillation who presents for follow up in the [facility].  The patient was initially diagnosed with atrial fibrillation remotely at a work event and appeared to be related to stress/dental procedures. Patient was at his PCP and found to be in rate controlled afib along with symptoms of fatigue and chest discomfort. He was restarted on Eliquis on 01/21/19 for a CHADS2VASC score of 4. He denies significant alcohol use.  On follow up today, patient reports that for about one week he has felt much more fatigued. He saw Dr Joylene Draft and confirmed he was back in afib. Patient does admit that he has gotten out of the habit of using his CPAP since his hip surgery. 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   Today, he denies symptoms of chest pain, shortness of breath, orthopnea, PND, lower extremity edema, dizziness, presyncope, syncope, bleeding, or neurologic sequela. The patient is tolerating medications without difficulties and is otherwise without complaint today.    Atrial Fibrillation Risk Factors:  he does have symptoms of sleep apnea.  He is not compliant with CPAP. he does not have a history of rheumatic fever. he does have a history of alcohol use. The patient does not have a history of early familial atrial fibrillation or other arrhythmias.  he has a BMI of Body mass index is 30.13 kg/m.Marland Kitchen Filed Weights   11/09/20 0859  Weight: 87.3 kg    Family History  Problem Relation Age of Onset  . Kidney Stones Mother   . Hypertension Father   . Heart attack Brother 50       had CABG  . Hypertension Brother       Atrial Fibrillation Management history:  Previous antiarrhythmic drugs: none Previous cardioversions: none Previous ablations: none CHADS2VASC score: 4 Anticoagulation history: Eliquis   Past Medical History:  Diagnosis Date  . A-fib ([place]) 09/20/2013  . Anxiety disorder 12/19/2009  . Arthritis of knee, degenerative 12/19/2009  . BP (high blood pressure) 12/19/2009  . Clinical depression 12/19/2009  . Depression   . 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 Elevated fasting blood sugar 12/19/2009  . Fatigue 12/19/2009  . GERD (gastroesophageal reflux disease)   . HLD (hyperlipidemia) 12/19/2009  . HTN (hypertension)   . Sleep apnea    Past Surgical History:  Procedure Laterality Date  . HERNIA REPAIR    . KNEE SURGERY Left   . LEFT HEART CATH AND CORONARY ANGIOGRAPHY N/A 04/15/2018   Procedure: LEFT HEART CATH AND CORONARY ANGIOGRAPHY;  Surgeon: Leonie Man, MD;  Location: [facility];  Service: Cardiovascular;  Laterality: N/A;  . TONSILLECTOMY    . TOTAL HIP ARTHROPLASTY Right 06/14/2020   Procedure: TOTAL HIP ARTHROPLASTY ANTERIOR APPROACH;  Surgeon: Gaynelle Arabian, MD;  Location: [facility];  Service: Orthopedics;  Laterality: Right;  . WISDOM TOOTH EXTRACTION      Current Outpatient Medications  Medication Sig Dispense Refill  . acetaminophen (TYLENOL) 650 MG CR tablet Take 1,300 mg by mouth every 8 (eight) hours as needed for pain.     Marland Kitchen amLODipine (NORVASC) 5 MG tablet TAKE 1 TABLET(5 MG) BY  MOUTH DAILY 90 tablet 3  . apixaban (ELIQUIS) 5 MG TABS tablet Take 5 mg by mouth 2 (two) times daily.    Marland Kitchen buPROPion (WELLBUTRIN XL) 150 MG 24 hr tablet Take 150 mg by mouth every morning.    . calcium carbonate (TUMS - DOSED IN MG ELEMENTAL CALCIUM) 500 MG chewable tablet Chew 1-2 tablets by mouth 3 (three) times daily as needed for indigestion or heartburn.     . Cholecalciferol (VITAMIN D3) 2000 units TABS Take 2,000  Units by mouth daily.    Marland Kitchen ENTRESTO 97-103 MG TAKE 1 TABLET BY MOUTH TWICE DAILY 60 tablet 6  . ezetimibe (ZETIA) 10 MG tablet Take 10 mg by mouth daily.    Marland Kitchen LORazepam (ATIVAN) 0.5 MG tablet Take 0.25-0.5 mg by mouth at bedtime as needed for sleep.    . methocarbamol (ROBAXIN) 500 MG tablet Take 1 tablet (500 mg total) by mouth every 6 (six) hours as needed for muscle spasms. 40 tablet 0  . metoprolol succinate (TOPROL-XL) 100 MG 24 hr tablet TAKE 1 TABLET BY MOUTH DAILY. TAKE WITH OR IMMEDIATELY FOLLOWING A MEAL. 90 tablet 1  . sertraline (ZOLOFT) 100 MG tablet Take 100 mg by mouth daily.      No current facility-administered medications for this encounter.    Allergies  Allergen Reactions  . Atorvastatin     Other reaction(s): felt bad, draggy, rough.  . Celebrex [Celecoxib] Itching  . Pravachol [Pravastatin]     Other reaction(s): made him feel bad.    Social History   Socioeconomic History  . Marital status: Married    Spouse name: Not on file  . Number of children: Not on file  . Years of education: Not on file  . Highest education level: Not on file  Occupational History  . Occupation: retired  Tobacco Use  . Smoking status: Former Smoker    Types: Cigars  . Smokeless tobacco: Never Used  . Tobacco comment: Quit about 3-4 months ago the occasional cigar  Vaping Use  . Vaping Use: Never used  Substance and Sexual Activity  . Alcohol use: No  . Drug use: No  . Sexual activity: Not on file  Other Topics Concern  . Not on file  Social History Narrative  . Not on file   Social Determinants of Health   Financial Resource Strain: Not on file  Food Insecurity: Not on file  Transportation Needs: Not on file  Physical Activity: Not on file  Stress: Not on file  Social Connections: Not on file  Intimate Partner Violence: Not on file     ROS- All systems are reviewed and negative except as per the HPI above.  Physical Exam: Vitals:   11/09/20 0859  BP: 138/82   Pulse: 74  Weight: 87.3 kg  Height: 5\' 7"  (1.702 m)    GEN- The patient is a well appearing obese male, alert and oriented x 3 today.   HEENT-head normocephalic, atraumatic, sclera clear, conjunctiva pink, hearing intact, trachea midline. Lungs- Clear to ausculation bilaterally, normal work of breathing Heart- irregular rate and rhythm, no murmurs, rubs or gallops  GI- soft, NT, ND, + BS Extremities- no clubbing, cyanosis, or edema MS- no significant deformity or atrophy Skin- no rash or lesion Psych- euthymic mood, full affect Neuro- strength and sensation are intact   Wt Readings from Last 3 Encounters:  11/09/20 87.3 kg  09/06/20 87.6 kg  06/14/20 89 kg    EKG today demonstrates  Afib  Vent. rate 74 BPM PR interval * ms QRS duration 98 ms QT/QTcB 410/455 ms  Echo 03/04/19 demonstrated   1. The left ventricle has moderate-severely reduced systolic function, with an ejection fraction of 30-35%. The cavity size was mildly dilated. Left ventricular diastolic Doppler parameters are consistent with pseudonormalization. Left ventricular  diffuse hypokinesis.  2. The right ventricle has normal systolic function. The cavity was normal.  3. Left atrial size was moderately dilated.  4. The mitral valve is abnormal. Mild thickening of the mitral valve leaflet.  5. The tricuspid valve is grossly normal.  6. The aortic valve is tricuspid. Mild thickening of the aortic valve. Aortic valve regurgitation is mild by color flow Doppler. No stenosis of the aortic valve.  7. The aorta is abnormal in size and structure.  8. There is mild dilatation of the aortic root measuring 40 mm.  9. Moderate to severe global reduction in LV systolic function; mild LVE; moderate diastolic dysfunction; moderate LAE; mildy dilated aortic root; mild AI; GLS-12.3%. Note cannot exclude apical thrombus; suggest FU limited study with definity to furter  assess.  Epic records are reviewed at length  today  Assessment and Plan:  1. Persistent atrial fibrillation Patient in symptomatic rate controlled afib today. We discussed therapeutic options including DCCV, AAD, and ablation. Will plan for DCCV, check bmet/CBC. If he has early return of his afib, could consider dofetilide vs ablation.  Stressed importance of CPAP compliance.  Continue Eliquis 5 mg BID Continue Toprol 50 mg BID  This patients CHA2DS2-VASc Score and unadjusted Ischemic Stroke Rate (% per year) is equal to 4.8 % stroke rate/year from a score of 4  Above score calculated as 1 point each if present [CHF, HTN, DM, Vascular=MI/PAD/Aortic Plaque, Age if 65-74, or Male] Above score calculated as 2 points each if present [Age > 75, or Stroke/TIA/TE]  2. OSA The importance of adequate treatment of sleep apnea was discussed today in order to improve our ability to maintain sinus rhythm long term. Patient to resume CPAP use today.  3. Combined systolic and diastolic CHF EF 70-01% on last echo.  No signs or symptoms of fluid overload.  4. HTN Stable, no changes today.  5. CAD Non obstructive on [facility] 03/2018.  No anginal symptoms.   Follow up in the AF clinic post DCCV.    [facility] [address] [place],  [address] [phone] 11/09/2020 9:04 AM

## 2020-11-09 ENCOUNTER — Other Ambulatory Visit: Payer: Self-pay

## 2020-11-09 ENCOUNTER — Encounter (HOSPITAL_COMMUNITY): Payer: Self-pay | Admitting: Physician Assistant

## 2020-11-09 ENCOUNTER — Ambulatory Visit (HOSPITAL_COMMUNITY)
Admission: RE | Admit: 2020-11-09 | Discharge: 2020-11-09 | Disposition: A | Discharge status: Home / Self Care | Payer: PPO | Source: Ambulatory Visit | Attending: Physician Assistant | Admitting: Physician Assistant

## 2020-11-09 VITALS — BP 138/82 | HR 74 | Ht 67.0 in | Wt 192.4 lb

## 2020-11-09 DIAGNOSIS — Z87891 Personal history of nicotine dependence: Secondary | ICD-10-CM | POA: Diagnosis not present

## 2020-11-09 DIAGNOSIS — E785 Hyperlipidemia, unspecified: Secondary | ICD-10-CM | POA: Diagnosis not present

## 2020-11-09 DIAGNOSIS — I351 Nonrheumatic aortic (valve) insufficiency: Secondary | ICD-10-CM | POA: Insufficient documentation

## 2020-11-09 DIAGNOSIS — I2510 Atherosclerotic heart disease of native coronary artery without angina pectoris: Secondary | ICD-10-CM | POA: Insufficient documentation

## 2020-11-09 DIAGNOSIS — I110 Hypertensive heart disease with heart failure: Secondary | ICD-10-CM | POA: Diagnosis not present

## 2020-11-09 DIAGNOSIS — G4733 Obstructive sleep apnea (adult) (pediatric): Secondary | ICD-10-CM | POA: Insufficient documentation

## 2020-11-09 DIAGNOSIS — I4819 Other persistent atrial fibrillation: Secondary | ICD-10-CM | POA: Insufficient documentation

## 2020-11-09 DIAGNOSIS — Z7901 Long term (current) use of anticoagulants: Secondary | ICD-10-CM | POA: Insufficient documentation

## 2020-11-09 DIAGNOSIS — D6869 Other thrombophilia: Secondary | ICD-10-CM | POA: Diagnosis not present

## 2020-11-09 DIAGNOSIS — F419 Anxiety disorder, unspecified: Secondary | ICD-10-CM | POA: Diagnosis not present

## 2020-11-09 DIAGNOSIS — Z79899 Other long term (current) drug therapy: Secondary | ICD-10-CM | POA: Diagnosis not present

## 2020-11-09 DIAGNOSIS — I5040 Unspecified combined systolic (congestive) and diastolic (congestive) heart failure: Secondary | ICD-10-CM | POA: Insufficient documentation

## 2020-11-09 LAB — BASIC METABOLIC PANEL
Anion gap: 4 — ABNORMAL LOW (ref 5–15)
BUN: 15 mg/dL (ref 8–23)
CO2: 26 mmol/L (ref 22–32)
Calcium: 8.6 mg/dL — ABNORMAL LOW (ref 8.9–10.3)
Chloride: 109 mmol/L (ref 98–111)
Creatinine, Ser: 1.09 mg/dL (ref 0.61–1.24)
GFR, Estimated: >60 mL/min (ref >60)
Glucose, Bld: 156 mg/dL — ABNORMAL HIGH (ref 70–99)
Potassium: 3.7 mmol/L (ref 3.5–5.1)
Sodium: 139 mmol/L (ref 135–145)

## 2020-11-09 LAB — CBC
HCT: 47.4 % (ref 39.0–52.0)
Hemoglobin: 15.6 g/dL (ref 13.0–17.0)
MCH: 31.1 pg (ref 26.0–34.0)
MCHC: 32.9 g/dL (ref 30.0–36.0)
MCV: 94.6 fL (ref 80.0–100.0)
Platelets: 172 10*3/uL (ref 150–400)
RBC: 5.01 MIL/uL (ref 4.22–5.81)
RDW: 13.1 % (ref 11.5–15.5)
WBC: 4.7 10*3/uL (ref 4.0–10.5)
nRBC: 0 % (ref 0.0–0.2)

## 2020-11-09 LAB — TSH: TSH: 1.9 u[IU]/mL (ref 0.350–4.500)

## 2020-11-09 NOTE — Patient Instructions (Signed)
Cardioversion scheduled for Tuesday, April 26th  - Arrive at the [facility] and go to admitting at [facility] not eat or drink anything after midnight the night prior to your procedure.  - Take all your morning medication (except diabetic medications) with a sip of water prior to arrival.  - You will not be able to drive home after your procedure.  - Do NOT miss any doses of your blood thinner - if you should miss a dose please notify our office immediately.  - If you feel as if you go back into normal rhythm prior to scheduled cardioversion, please notify our office immediately. If your procedure is canceled in the cardioversion suite you will be charged a cancellation fee.

## 2020-11-17 ENCOUNTER — Other Ambulatory Visit (HOSPITAL_COMMUNITY): Payer: PPO

## 2020-11-20 ENCOUNTER — Ambulatory Visit (HOSPITAL_COMMUNITY)
Admission: RE | Admit: 2020-11-20 | Discharge: 2020-11-20 | Disposition: A | Discharge status: Home / Self Care | Payer: PPO | Source: Ambulatory Visit | Attending: Physician Assistant | Admitting: Physician Assistant

## 2020-11-20 ENCOUNTER — Other Ambulatory Visit: Payer: Self-pay

## 2020-11-20 DIAGNOSIS — I4819 Other persistent atrial fibrillation: Secondary | ICD-10-CM

## 2020-11-20 NOTE — Progress Notes (Signed)
Patient presents for ECG to confirm he is back in SR. He checked on a smart watch 11/15/20 which showed SR. He also noted symptomatic improvement. ECG today shows SR HR 65, PR 194, QRS 100, QTc 447. DCCV cancelled. Patient still not using CPAP, will have him follow up with Dr Claiborne Billings to discuss options. AF clinic in 6 months.

## 2020-11-21 ENCOUNTER — Ambulatory Visit (HOSPITAL_COMMUNITY): Admission: RE | Admit: 2020-11-21 | Payer: PPO | Source: Home / Self Care | Admitting: Cardiology

## 2020-11-21 ENCOUNTER — Encounter (HOSPITAL_COMMUNITY): Admission: RE | Payer: Self-pay | Source: Home / Self Care

## 2020-11-21 SURGERY — CARDIOVERSION
Anesthesia: General

## 2020-11-28 ENCOUNTER — Ambulatory Visit (HOSPITAL_COMMUNITY): Payer: PPO | Admitting: Physician Assistant

## 2020-12-11 ENCOUNTER — Other Ambulatory Visit: Payer: Self-pay | Admitting: Cardiology

## 2021-01-30 ENCOUNTER — Encounter: Payer: Self-pay | Admitting: Cardiovascular Disease

## 2021-01-30 ENCOUNTER — Ambulatory Visit: Payer: PPO | Admitting: Cardiovascular Disease

## 2021-01-30 ENCOUNTER — Other Ambulatory Visit: Payer: Self-pay

## 2021-01-30 DIAGNOSIS — G720 Drug-induced myopathy: Secondary | ICD-10-CM | POA: Insufficient documentation

## 2021-01-30 DIAGNOSIS — R0683 Snoring: Secondary | ICD-10-CM

## 2021-01-30 DIAGNOSIS — I10 Essential (primary) hypertension: Secondary | ICD-10-CM | POA: Diagnosis not present

## 2021-01-30 DIAGNOSIS — G4733 Obstructive sleep apnea (adult) (pediatric): Secondary | ICD-10-CM

## 2021-01-30 DIAGNOSIS — E785 Hyperlipidemia, unspecified: Secondary | ICD-10-CM

## 2021-01-30 DIAGNOSIS — I428 Other cardiomyopathies: Secondary | ICD-10-CM | POA: Diagnosis not present

## 2021-01-30 DIAGNOSIS — I480 Paroxysmal atrial fibrillation: Secondary | ICD-10-CM | POA: Diagnosis not present

## 2021-01-30 NOTE — Patient Instructions (Signed)
Medication Instructions:  No changes *If you need a refill on your cardiac medications before your next appointment, please call your pharmacy*   Lab Work: CBC,CMP,Lipid,Magnesium,TSH. If you have labs (blood work) drawn today and your tests are completely normal, you will receive your results only by: [place] (if you have MyChart) OR A paper copy in the mail If you have any lab test that is abnormal or we need to change your treatment, we will call you to review the results.   Testing/Procedures: [address], [address]. Your physician has requested that you have an echocardiogram. Echocardiography is a painless test that uses sound waves to create images of your heart. It provides your doctor with information about the size and shape of your heart and how well your heart's chambers and valves are working. This procedure takes approximately one hour. There are no restrictions for this procedure.    Follow-Up: At [facility], you and your health needs are our priority.  As part of our continuing mission to provide you with exceptional heart care, we have created designated Provider Care Teams.  These Care Teams include your primary Cardiologist (physician) and Advanced Practice Providers (APPs -  Physician Assistants and Nurse Practitioners) who all work together to provide you with the care you need, when you need it.    Your next appointment:   4 month(s)  The format for your next appointment:   In Person  Provider:   Shelva Majestic, MD

## 2021-01-30 NOTE — Progress Notes (Signed)
Cardiology Office Note    Date:  02/05/2021   ID:  Juan Montes, DOB 05-30-1951, MRN [id number]  PCP:  Crist Infante, MD  Cardiologist:  Shelva Majestic, MD (sleep); Dr. Al Pimple  New sleep evaluation   History of Present Illness:  Juan Montes is a 70 y.o. male who is followed by Dr. Al Pimple for primary cardiology care with Dr. Crist Infante is his primary provider.  He has a history of paroxysmal atrial fibrillation, essential hypertension, combined systolic and diastolic heart failure with a nonischemic cardiomyopathy, as well as history of prior LV thrombus with resolution and hyperlipidemia.  An echo Doppler study in August 2020 showed an EF of 30 to 35% with grade 2 diastolic dysfunction.  His left atrium was moderately dilated.  There was aortic sclerosis without stenosis.  His aortic root measured 40 mm.  He had abnormal LV strain.  The patient has been followed in atrial fibrillation clinic and was last evaluated in April 2022 when he was found to be back in atrial fibrillation.  He has a history of obstructive sleep apnea which was originally diagnosed in September 2020.  A split-night protocol confirmed severe sleep apnea with an AHI of 41.3 and RDI of 62.7 on the diagnostic portion of the study.  Events were very severe during REM sleep with an AHI of 70.6.  He met split-night criteria and CPAP was initiated at 5 cm and titrated to 13 cm.  At 13 cm, AHI was still increased to 8.4 and RDI 11.3 with O2 nadir at 92%.  Due to initiation of the COVID pandemic, unfortunately he never was seen in a sleep clinic evaluation following CPAP initiation.  Apparently, he has not been reliable with CPAP use and admits to significant fatigability as well as daytime sleepiness, snoring, and nocturia.  I calculated a Epworth Sleepiness Scale score today which endorsed at 10 and is consistent with excessive daytime sleepiness.    Epworth Sleepiness Scale: Situation   Chance of  Dozing/Sleeping (0 = never , 1 = slight chance , 2 = moderate chance , 3 = high chance )   sitting and reading 1   watching TV 1   sitting inactive in a public place 1   being a passenger in a motor vehicle for an hour or more 1   lying down in the afternoon 3   sitting and talking to someone 0   sitting quietly after lunch (no alcohol) 3   while stopped for a few minutes in traffic as the driver 0   Total Score  10    Typically he goes to bed between 10 PM and midnight and wakes up at 8:00.  He has nocturia at least 2 times per night.  He is unaware of bruxism, restless legs, hypnagogic hallucinations, or cataplectic events.  He previously had exercise but essentially stopped exercising recently.  He had undergone hip replacement surgery November 2021 with Dr. Sandie Ano.  He states he had COVID around Christmas 2021.  He presents for his initial sleep evaluation with me.  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   Elevated fasting blood sugar 12/19/2009   Fatigue 12/19/2009   GERD (gastroesophageal reflux disease)  HLD (hyperlipidemia) 12/19/2009   HTN (hypertension)    Sleep apnea     Past Surgical History:  Procedure Laterality Date   HERNIA REPAIR     KNEE SURGERY Left    LEFT HEART CATH AND CORONARY ANGIOGRAPHY N/A 04/15/2018   Procedure: LEFT HEART CATH AND CORONARY ANGIOGRAPHY;  Surgeon: Leonie Man, MD;  Location: [facility];  Service: Cardiovascular;  Laterality: N/A;   TONSILLECTOMY     TOTAL HIP ARTHROPLASTY Right 06/14/2020   Procedure: TOTAL HIP ARTHROPLASTY ANTERIOR APPROACH;  Surgeon: Gaynelle Arabian, MD;  Location: [facility];  Service: Orthopedics;  Laterality: Right;   WISDOM TOOTH EXTRACTION       Current Medications: Outpatient Medications Prior to Visit  Medication Sig Dispense Refill   acetaminophen (TYLENOL) 650 MG CR tablet Take 1,300 mg by mouth every 8 (eight) hours as needed for pain.      amLODipine (NORVASC) 5 MG tablet TAKE 1 TABLET(5 MG) BY MOUTH DAILY (Patient taking differently: Take 5 mg by mouth daily.) 90 tablet 3   apixaban (ELIQUIS) 5 MG TABS tablet Take 5 mg by mouth 2 (two) times daily.     buPROPion (WELLBUTRIN XL) 150 MG 24 hr tablet Take 150 mg by mouth every morning.     calcium carbonate (TUMS - DOSED IN MG ELEMENTAL CALCIUM) 500 MG chewable tablet Chew 1-2 tablets by mouth 3 (three) times daily as needed for indigestion or heartburn.      Cholecalciferol (VITAMIN D3) 2000 units TABS Take 2,000 Units by mouth daily.     ENTRESTO 97-103 MG TAKE 1 TABLET BY MOUTH TWICE DAILY (Patient taking differently: Take 1 tablet by mouth 2 (two) times daily.) 60 tablet 6   ezetimibe (ZETIA) 10 MG tablet Take 10 mg by mouth daily.     methocarbamol (ROBAXIN) 500 MG tablet Take 1 tablet (500 mg total) by mouth every 6 (six) hours as needed for muscle spasms. 40 tablet 0   metoprolol succinate (TOPROL-XL) 100 MG 24 hr tablet TAKE 1 TABLET BY MOUTH DAILY. TAKE WITH OR IMMEDIATELY FOLLOWING A MEAL. 90 tablet 1   sertraline (ZOLOFT) 100 MG tablet Take 100 mg by mouth daily.      No facility-administered medications prior to visit.     Allergies:   Atorvastatin, Celecoxib, and Pravachol [pravastatin]   Social History   Socioeconomic History   Marital status: Married    Spouse name: Not on file   Number of children: Not on file   Years of education: Not on file   Highest education level: Not on file  Occupational History   Occupation: retired  Tobacco Use   Smoking status: Former    Pack years: 0.00    Types: Cigars   Smokeless tobacco: Never   Tobacco comments:    Quit about 3-4 months ago the occasional cigar  Vaping Use   Vaping Use: Never used  Substance  and Sexual Activity   Alcohol use: No   Drug use: No   Sexual activity: Not on file  Other Topics Concern   Not on file  Social History Narrative   Not on file   Social Determinants of Health   Financial Resource Strain: Not on file  Food Insecurity: Not on file  Transportation Needs: Not on file  Physical Activity: Not on file  Stress: Not on file  Social Connections: Not on file     Family History:  [name] includes Heart attack (age of onset: 26)  in his brother; Hypertension in his brother and father; Kidney Stones in his mother.   ROS General: Negative; No fevers, chills, or night sweats;  HEENT: Negative; No changes in vision or hearing, sinus congestion, difficulty swallowing Pulmonary: Negative; No cough, wheezing, shortness of breath, hemoptysis Cardiovascular: Chronic systolic and diastolic heart failure, hypertension, hyperlipidemia, paroxysmal atrial fibrillation, and prior LV thrombus with resolution GI: Negative; No nausea, vomiting, diarrhea, or abdominal pain GU: Negative; No dysuria, hematuria, or difficulty voiding Musculoskeletal: Status post hip replacement surgery November 2021, surgery was delayed due to [place] Hematologic/Oncology: Negative; no easy bruising, bleeding Endocrine: Negative; no heat/cold intolerance; no diabetes Neuro: Negative; no changes in balance, headaches Skin: Negative; No rashes or skin lesions Psychiatric: Negative; No behavioral problems, depression Sleep: See HPI Other comprehensive 14 point system review is negative.   PHYSICAL EXAM:   VS:  BP 130/71   Pulse 88   Ht _0  (1.702 m)   Wt 192 lb 12.8 oz (87.5 kg)   SpO2 97%   BMI 30.20 kg/m     Repeat blood pressure by me was 120/74  Wt Readings from Last 3 Encounters:  01/30/21 192 lb 12.8 oz (87.5 kg)  11/09/20 192 lb 6.4 oz (87.3 kg)  09/06/20 193 lb 3.2 oz (87.6 kg)    General: Alert, oriented, no distress.  Skin: normal turgor, no rashes,  warm and dry HEENT: Normocephalic, atraumatic. Pupils equal round and reactive to light; sclera anicteric; extraocular muscles intact;  Nose without nasal septal hypertrophy Mouth/Parynx benign; Mallinpatti scale 3 Neck: No JVD, no carotid bruits; normal carotid upstroke Lungs: clear to ausculatation and percussion; no wheezing or rales Chest Holtzclaw: without tenderness to palpitation Heart: PMI not displaced, RRR, s1 s2 normal, 1/6 systolic murmur, no diastolic murmur, no rubs, gallops, thrills, or heaves Abdomen: soft, nontender; no hepatosplenomehaly, BS+; abdominal aorta nontender and not dilated by palpation. Back: no CVA tenderness Pulses 2+ Musculoskeletal: full range of motion, normal strength, no joint deformities Extremities: no clubbing cyanosis or edema, Homan's sign negative  Neurologic: grossly nonfocal; Cranial nerves grossly wnl Psychologic: Normal mood and affect   Studies/Labs Reviewed:   EKG:  EKG is ordered today.  ECG (independently read by me):  Atrial fibrillation at 88 with frequent PVCs and transient bigeminal rhythm, nonspecific STT changes  Recent Labs: BMP Latest Ref Rng & Units 01/31/2021 11/09/2020 06/15/2020  Glucose 65 - 99 mg/dL 94 156(H) 155(H)  BUN 8 - 27 mg/dL _1 Creatinine 0.76 - 1.27 mg/dL 1.25 1.09 1.11  BUN/Creat Ratio 10 - 24 14 - -  Sodium 134 - 144 mmol/L 141 139 137  Potassium 3.5 - 5.2 mmol/L 5.0 3.7 4.6  Chloride 96 - 106 mmol/L 103 109 103  CO2 20 - 29 mmol/L _2 Calcium 8.6 - 10.2 mg/dL 9.1 8.6(L) 8.3(L)     Hepatic Function Latest Ref Rng & Units 01/31/2021 06/07/2020  Total Protein 6.0 - 8.5 g/dL 6.4 7.1  Albumin 3.8 - 4.8 g/dL 4.5 4.2  AST 0 - 40 IU/L 14 20  ALT 0 - 44 IU/L 12 20  Alk Phosphatase 44 - 121 IU/L 118 93  Total Bilirubin 0.0 - 1.2 mg/dL 0.6 0.9    CBC Latest Ref Rng & Units 01/31/2021 11/09/2020 06/15/2020  WBC 3.4 - 10.8 x10E3/uL 4.9 4.7 12.9(H)  Hemoglobin 13.0 - 17.7 g/dL 15.6 15.6 13.2  Hematocrit  37.5 - 51.0 % 46.3 47.4 40.1  Platelets 150 - 450 x10E3/uL 195 172  178   Lab Results  Component Value Date   MCV 94 01/31/2021   MCV 94.6 11/09/2020   MCV 97.3 06/15/2020   Lab Results  Component Value Date   TSH 2.750 01/31/2021   No results found for: HGBA1C   BNP No results found for: BNP  ProBNP No results found for: PROBNP   Lipid Panel     Component Value Date/Time   CHOL 158 01/31/2021 0915   TRIG 99 01/31/2021 0915   HDL 55 01/31/2021 0915   CHOLHDL 2.9 01/31/2021 0915   LDLCALC 85 01/31/2021 0915   LABVLDL 18 01/31/2021 0915     RADIOLOGY: No results found.   Additional studies/ records that were reviewed today include:    03/30/2019 CLINICAL INFORMATION Sleep Study Type: Split Night CPAP   Indication for sleep study: Fatigue, Hypertension, Obesity, Snoring   Epworth Sleepiness Score: 6   SLEEP STUDY TECHNIQUE As per the AASM Manual for the Scoring of Sleep and Associated Events v2.3 (April 2016) with a hypopnea requiring 4% desaturations.   The channels recorded and monitored were frontal, central and occipital EEG, electrooculogram (EOG), submentalis EMG (chin), nasal and oral airflow, thoracic and abdominal [place]aseg[place] motion, anterior tibialis EMG, snore microphone, electrocardiogram, and pulse oximetry. Continuous positive airway pressure (CPAP) was initiated when the patient met split night criteria and was titrated according to treat sleep-disordered breathing.   MEDICATIONS     amLODipine (NORVASC) 5 MG tablet (Expired)             apixaban (ELIQUIS) 5 MG TABS tablet         calcium carbonate (TUMS) 500 MG chewable tablet         Cholecalciferol (VITAMIN D3) 2000 units TABS         ENTRESTO 97-103 MG         metoprolol succinate (TOPROL-XL) 50 MG 24 hr tablet         sertraline (ZOLOFT) 100 MG tablet       Medications self-administered by patient taken the night of the study : N/A   RESPIRATORY PARAMETERS Diagnostic Total AHI (/hr):             41.3     RDI (/hr):         62.7     OA Index (/hr):            3.9       CA Index (/hr):      0.5 REM AHI (/hr):            70.6     NREM AHI (/hr):          39.1     Supine AHI (/hr):         38.5     Non-supine AHI (/hr):        46.4 Min O2 Sat (%):          86.0     Mean O2 (%):  93.2     Time below 88% (min):           1.7          Titration Optimal Pressure (cm):           13        AHI at Optimal Pressure (/hr):            8.4       Min O2 at Optimal Pressure (%):       92.0 Supine % at  Optimal (%):       100      Sleep % at Optimal (%):         99           SLEEP ARCHITECTURE The recording time for the entire night was 428.6 minutes.   During a baseline period of 234.6 minutes, the patient slept for 123.5 minutes in REM and nonREM, yielding a sleep efficiency of 52.6%%. Sleep onset after lights out was 79.8 minutes with a REM latency of 88.5 minutes. The patient spent 22.3%% of the night in stage N1 sleep, 70.9%% in stage N2 sleep, 0.0%% in stage N3 and 6.9% in REM.   During the titration period of 188.2 minutes, the patient slept for 155.5 minutes in REM and nonREM, yielding a sleep efficiency of 82.6%%. Sleep onset after CPAP initiation was 9.9 minutes with a REM latency of 5.5 minutes. The patient spent 14.5%% of the night in stage N1 sleep, 76.8%% in stage N2 sleep, 0.0%% in stage N3 and 8.7% in REM.   CARDIAC DATA The 2 lead EKG demonstrated sinus rhythm. The mean heart rate was 100.0 beats per minute. Other EKG findings include: PVCs.   LEG MOVEMENT DATA The total Periodic Limb Movements of Sleep (PLMS) were 0. The PLMS index was 0.0 .   IMPRESSIONS - Severe obstructive sleep apnea occurred during the diagnostic portion of the study (AHI 41.3/h; RDI 62.7/h). Events were very severe during REM sleep (AHI 70.6/h). CPAP was initiated at 5 cm and was titrated to 13 cm of water. AHI at 13 cm was 8.4/h and RDI was 11.3/h with 02 nadir at 92%. - No significant central sleep apnea  occurred during the diagnostic portion of the study (CAI = 0.5/hour). - Mild oxygen desaturation to a nadir of 86.0%. - The patient snored with moderate snoring volume during the diagnostic portion of the study. - EKG findings include PVCs. - Clinically significant periodic limb movements did not occur during sleep.   DIAGNOSIS - Obstructive Sleep Apnea (327.23 [G47.33 ICD-10])   RECOMMENDATIONS - Recommend an initial trial of CPAP Auto therapy with EPR at a range of 12 - 20 cm with heated humidification.  A Medium size Fisher[employer]Paykel Full Face Mask Simplus mask was used for the titration study. - Effort should be made to optimize nasal and oropharyngeal patency. - Avoid alcohol, sedatives and other CNS depressants that may worsen sleep apnea and disrupt normal sleep architecture. - Sleep hygiene should be reviewed to assess factors that may improve sleep quality. - Weight management (BMI 30) and regular exercise should be initiated or continued. - Recommend a download in 30 days and sleep clinic evaluation after 4 weeks of therapy.   ASSESSMENT:    1. OSA (obstructive sleep apnea)   2. Snoring   3. Paroxysmal atrial fibrillation (HCC)   4. NICM (nonischemic cardiomyopathy) ([place])   5. Essential hypertension   6. Hyperlipidemia, unspecified hyperlipidemia type     PLAN:  Juan Montes is a 70 year old gentleman who has significant cardiovascular comorbidities including chronic systolic and diastolic heart failure, hypertension, hyperlipidemia, prior LV thrombus with resolution, as well as paroxysmal atrial fibrillation.  In 2020, due to symptoms highly suggestive of obstructive sleep apnea he underwent a split-night 6 sleep study which confirmed severe with an AHI of 41.3/h, RDI 62.7/h and during rem sleep AHI was 70.6/h.  CPAP was titrated up to 13 cm water pressure.  Throughout the protocol the patient had PVCs.  There also was  moderate snoring which resolved with CPAP.  Apparently, he  only tried CPAP for several months and then essentially stopped using treatment.  Unfortunately due to the [place] pandemic he never followed up in sleep clinic.  Fortunately, an initial download from May 18, 2019 through June 16, 2019 confirmed excellent compliance with 100% use and usage greater than 4 hours was 77%.  However usage duration was suboptimal at only 5 hours and 41 minutes.  Fortunately he met compliance standards and therefore still has his machine.  AHI at that time was 6.5 and his 95th percentile pressure was 14.2 with a maximum average pressure of 15.3.  I had a very lengthy discussion with him in the office today.  I discussed the adverse consequences of untreated sleep apnea particularly with reference to his cardiovascular health.  I discussed its effects on sleep architecture.  We discussed its effects on blood pressure, potential for nocturnal hypoxemia contributing both to nocturnal arrhythmias including atrial fibrillation, as well as potential nocturnal ischemia contributed by hypoxemia in the setting of his severe sleep apnea.  The patient has paroxysmal atrial fibrillation and had recently reverted back to atrial fibrillation undoubtedly contributed by his untreated sleep apnea.  I also discussed its effects on glucose, inflammation, as well as GERD.  Patient used to exercise but no longer does this.  He had undergone hip replacement surgery.  Presently he is giving himself approximately 8 - 10 hours of sleep per night by going to bed between 10 PM and midnight and often waking up at 8 AM.  I will change his settings and currently he is ramp was set at 45 minutes with a start pressure 4.  I will change his ramp time to 15 minutes with start pressure at 7 cm.  I will change his auto therapy so that he will start at 10 cm with titration up to 20 cm of water if needed.  I have recommended he undergo a follow-up echo Doppler study for reassessment of his LV function so that this data  will be available when he sees Dr. [name] at the end of next month.  I will also schedule him to undergo laboratory with a comprehensive metabolic panel, CBC, TSH, magnesium, and lipid studies.  He is on Entresto 97/103 mg twice daily, metoprolol succinate 100 mg daily, and amlodipine 5 mg daily.  His blood pressure today is stable.  His ECG demonstrates atrial fibrillation with ventricular rate in the 80s.  He is on Zetia for hyperlipidemia with apparent intolerance to statins in the past.  He will follow-up with Dr. [name] in August.  I will see him in 4 months for follow-up sleep evaluation.  Time spent: 40 minutes  Medication Adjustments/Labs and Tests Ordered: Current medicines are reviewed at length with the patient today.  Concerns regarding medicines are outlined above.  Medication changes, Labs and Tests ordered today are listed in the Patient Instructions below. Patient Instructions  Medication Instructions:  No changes *If you need a refill on your cardiac medications before your next appointment, please call your pharmacy*   Lab Work: CBC,CMP,Lipid,Magnesium,TSH. If you have labs (blood work) drawn today and your tests are completely normal, you will receive your results only by: [place] (if you have MyChart) OR A paper copy in the mail If you have any lab test that is abnormal or we need to change your treatment, we will call you to review the results.   Testing/Procedures: [address], [address]. Your  physician has requested that you have an echocardiogram. Echocardiography is a painless test that uses sound waves to create images of your heart. It provides your doctor with information about the size and shape of your heart and how well your heart's chambers and valves are working. This procedure takes approximately one hour. There are no restrictions for this procedure.    Follow-Up: At [facility], you and your health needs are our priority.   As part of our continuing mission to provide you with exceptional heart care, we have created designated Provider Care Teams.  These Care Teams include your primary Cardiologist (physician) and Advanced Practice Providers (APPs -  Physician Assistants and Nurse Practitioners) who all work together to provide you with the care you need, when you need it.    Your next appointment:   4 month(s)  The format for your next appointment:   In Person  Provider:   Shelva Majestic, MD       Signed, Shelva Majestic, MD  02/05/2021 5:36 PM    [facility] [address], [place], [place], [place]  [address] Phone: [phone]

## 2021-01-31 DIAGNOSIS — E785 Hyperlipidemia, unspecified: Secondary | ICD-10-CM | POA: Diagnosis not present

## 2021-01-31 DIAGNOSIS — I480 Paroxysmal atrial fibrillation: Secondary | ICD-10-CM | POA: Diagnosis not present

## 2021-01-31 DIAGNOSIS — R0683 Snoring: Secondary | ICD-10-CM | POA: Diagnosis not present

## 2021-01-31 DIAGNOSIS — I10 Essential (primary) hypertension: Secondary | ICD-10-CM | POA: Diagnosis not present

## 2021-01-31 LAB — LIPID PANEL
Chol/HDL Ratio: 2.9 ratio (ref 0.0–5.0)
Cholesterol, Total: 158 mg/dL (ref 100–199)
HDL: 55 mg/dL (ref >39)
LDL Chol Calc (NIH): 85 mg/dL (ref 0–99)
Triglycerides: 99 mg/dL (ref 0–149)
VLDL Cholesterol Cal: 18 mg/dL (ref 5–40)

## 2021-01-31 LAB — COMPREHENSIVE METABOLIC PANEL
ALT: 12 IU/L (ref 0–44)
AST: 14 IU/L (ref 0–40)
Albumin/Globulin Ratio: 2.4 — ABNORMAL HIGH (ref 1.2–2.2)
Albumin: 4.5 g/dL (ref 3.8–4.8)
Alkaline Phosphatase: 118 IU/L (ref 44–121)
BUN/Creatinine Ratio: 14 (ref 10–24)
BUN: 18 mg/dL (ref 8–27)
Bilirubin Total: 0.6 mg/dL (ref 0.0–1.2)
CO2: 24 mmol/L (ref 20–29)
Calcium: 9.1 mg/dL (ref 8.6–10.2)
Chloride: 103 mmol/L (ref 96–106)
Creatinine, Ser: 1.25 mg/dL (ref 0.76–1.27)
Globulin, Total: 1.9 g/dL (ref 1.5–4.5)
Glucose: 94 mg/dL (ref 65–99)
Potassium: 5 mmol/L (ref 3.5–5.2)
Sodium: 141 mmol/L (ref 134–144)
Total Protein: 6.4 g/dL (ref 6.0–8.5)
eGFR: 62 mL/min/{1.73_m2} (ref >59)

## 2021-01-31 LAB — CBC
Hematocrit: 46.3 % (ref 37.5–51.0)
Hemoglobin: 15.6 g/dL (ref 13.0–17.7)
MCH: 31.8 pg (ref 26.6–33.0)
MCHC: 33.7 g/dL (ref 31.5–35.7)
MCV: 94 fL (ref 79–97)
Platelets: 195 10*3/uL (ref 150–450)
RBC: 4.91 x10E6/uL (ref 4.14–5.80)
RDW: 12.8 % (ref 11.6–15.4)
WBC: 4.9 10*3/uL (ref 3.4–10.8)

## 2021-01-31 LAB — MAGNESIUM: Magnesium: 2 mg/dL (ref 1.6–2.3)

## 2021-01-31 LAB — TSH: TSH: 2.75 u[IU]/mL (ref 0.450–4.500)

## 2021-02-05 ENCOUNTER — Encounter: Payer: Self-pay | Admitting: Cardiovascular Disease

## 2021-02-07 DIAGNOSIS — G4733 Obstructive sleep apnea (adult) (pediatric): Secondary | ICD-10-CM | POA: Diagnosis not present

## 2021-02-20 ENCOUNTER — Other Ambulatory Visit: Payer: Self-pay

## 2021-02-20 ENCOUNTER — Ambulatory Visit (HOSPITAL_COMMUNITY): Payer: PPO | Attending: Internal Medicine

## 2021-02-20 DIAGNOSIS — E785 Hyperlipidemia, unspecified: Secondary | ICD-10-CM

## 2021-02-20 DIAGNOSIS — R0683 Snoring: Secondary | ICD-10-CM | POA: Diagnosis not present

## 2021-02-20 DIAGNOSIS — I480 Paroxysmal atrial fibrillation: Secondary | ICD-10-CM | POA: Diagnosis not present

## 2021-02-20 DIAGNOSIS — I10 Essential (primary) hypertension: Secondary | ICD-10-CM | POA: Diagnosis not present

## 2021-02-20 LAB — ECHOCARDIOGRAM COMPLETE
Area-P 1/2: 2.29 cm2
MV M vel: 4.74 m/s
MV Peak grad: 89.9 mmHg
P 1/2 time: 770 msec
S' Lateral: 4.8 cm

## 2021-02-20 MED ORDER — PERFLUTREN LIPID MICROSPHERE
1.0000 mL | INTRAVENOUS | Status: AC | PRN
Start: 2021-02-20 — End: 2021-02-20
  Administered 2021-02-20: 2 mL via INTRAVENOUS

## 2021-02-26 ENCOUNTER — Ambulatory Visit (HOSPITAL_COMMUNITY)
Admission: RE | Admit: 2021-02-26 | Discharge: 2021-02-26 | Disposition: A | Discharge status: Home / Self Care | Payer: PPO | Source: Ambulatory Visit | Attending: Physician Assistant | Admitting: Physician Assistant

## 2021-02-26 ENCOUNTER — Encounter (HOSPITAL_COMMUNITY): Payer: Self-pay | Admitting: Physician Assistant

## 2021-02-26 ENCOUNTER — Other Ambulatory Visit: Payer: Self-pay

## 2021-02-26 VITALS — BP 122/98 | HR 79 | Ht 67.0 in | Wt 196.0 lb

## 2021-02-26 DIAGNOSIS — I110 Hypertensive heart disease with heart failure: Secondary | ICD-10-CM | POA: Insufficient documentation

## 2021-02-26 DIAGNOSIS — G4733 Obstructive sleep apnea (adult) (pediatric): Secondary | ICD-10-CM | POA: Diagnosis not present

## 2021-02-26 DIAGNOSIS — I2510 Atherosclerotic heart disease of native coronary artery without angina pectoris: Secondary | ICD-10-CM | POA: Insufficient documentation

## 2021-02-26 DIAGNOSIS — Z7901 Long term (current) use of anticoagulants: Secondary | ICD-10-CM | POA: Diagnosis not present

## 2021-02-26 DIAGNOSIS — Z79899 Other long term (current) drug therapy: Secondary | ICD-10-CM | POA: Diagnosis not present

## 2021-02-26 DIAGNOSIS — D6869 Other thrombophilia: Secondary | ICD-10-CM

## 2021-02-26 DIAGNOSIS — I4819 Other persistent atrial fibrillation: Secondary | ICD-10-CM | POA: Diagnosis not present

## 2021-02-26 DIAGNOSIS — I5040 Unspecified combined systolic (congestive) and diastolic (congestive) heart failure: Secondary | ICD-10-CM | POA: Insufficient documentation

## 2021-02-26 DIAGNOSIS — R5383 Other fatigue: Secondary | ICD-10-CM | POA: Diagnosis not present

## 2021-02-26 NOTE — Progress Notes (Signed)
Primary Care Physician: Crist Infante, MD Primary Cardiologist: Dr [name] Primary Electrophysiologist: none Referring Physician: Dr Caren Hazy is a 70 y.o. male with a history of chronic systolic heart failure, HTN, OSA, HLD, prior LV thrombus with resolution (2015) and paroxysmal atrial fibrillation who presents for follow up in the [facility].  The patient was initially diagnosed with atrial fibrillation remotely at a work event and appeared to be related to stress/dental procedures. Patient was at his PCP and found to be in rate controlled afib along with symptoms of fatigue and chest discomfort. He was restarted on Eliquis on 01/21/19 for a CHADS2VASC score of 4. He denies significant alcohol use.  On follow up today, patient reports that he has been more fatigued over the last several weeks. At his visit with Dr Claiborne Billings on 01/30/21 he was noted to be in rate controlled afib. He denies any bleeding issues on anticoagulation.   Today, he denies symptoms of palpitations, chest pain, shortness of breath, orthopnea, PND, lower extremity edema, dizziness, presyncope, syncope, bleeding, or neurologic sequela. The patient is tolerating medications without difficulties and is otherwise without complaint today.    Atrial Fibrillation Risk Factors:  he does have symptoms of sleep apnea.  He is compliant with CPAP. he does not have a history of rheumatic fever. he does have a history of alcohol use. The patient does not have a history of early familial atrial fibrillation or other arrhythmias.  he has a BMI of Body mass index is 30.7 kg/m.Marland Kitchen Filed Weights   02/26/21 1027  Weight: 88.9 kg    Family History  Problem Relation Age of Onset   Kidney Stones Mother    Hypertension Father    Heart attack Brother 104       had CABG   Hypertension Brother      Atrial Fibrillation Management history:  Previous antiarrhythmic drugs: none Previous  cardioversions: none Previous ablations: none CHADS2VASC score: 4 Anticoagulation history: Eliquis   Past Medical History:  Diagnosis Date   A-fib ([place]) 09/20/2013   Anxiety disorder 12/19/2009   Arthritis of knee, degenerative 12/19/2009   BP (high blood pressure) 12/19/2009   Clinical depression 12/19/2009   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   Elevated fasting blood sugar 12/19/2009   Fatigue 12/19/2009   GERD (gastroesophageal reflux disease)    HLD (hyperlipidemia) 12/19/2009   HTN (hypertension)    Sleep apnea    Past Surgical History:  Procedure Laterality Date   HERNIA REPAIR     KNEE SURGERY Left    LEFT HEART CATH AND CORONARY ANGIOGRAPHY N/A 04/15/2018   Procedure: LEFT HEART CATH AND CORONARY ANGIOGRAPHY;  Surgeon: Leonie Man, MD;  Location: [facility];  Service: Cardiovascular;  Laterality: N/A;   TONSILLECTOMY     TOTAL HIP ARTHROPLASTY Right 06/14/2020   Procedure: TOTAL HIP ARTHROPLASTY ANTERIOR APPROACH;  Surgeon: Gaynelle Arabian, MD;  Location: [facility];  Service: Orthopedics;  Laterality: Right;   WISDOM TOOTH EXTRACTION      Current Outpatient Medications  Medication Sig Dispense Refill   acetaminophen (TYLENOL) 650 MG CR tablet Take 1,300 mg by mouth every 8 (eight) hours as needed for pain.      amLODipine (NORVASC) 5 MG tablet TAKE 1 TABLET(5 MG) BY MOUTH DAILY 90 tablet 3   apixaban (ELIQUIS) 5 MG TABS tablet Take 5 mg by mouth 2 (two)  times daily.     buPROPion (WELLBUTRIN XL) 150 MG 24 hr tablet Take 150 mg by mouth every morning.     calcium carbonate (TUMS - DOSED IN MG ELEMENTAL CALCIUM) 500 MG chewable tablet Chew 1-2 tablets by mouth 3 (three) times daily as needed for indigestion or heartburn.      Cholecalciferol (VITAMIN D3) 2000 units TABS Take 2,000 Units by mouth daily.     ENTRESTO 97-103 MG TAKE 1 TABLET BY MOUTH TWICE DAILY 60 tablet 6   ezetimibe (ZETIA) 10 MG tablet  Take 10 mg by mouth daily.     methocarbamol (ROBAXIN) 500 MG tablet Take 1 tablet (500 mg total) by mouth every 6 (six) hours as needed for muscle spasms. 40 tablet 0   metoprolol succinate (TOPROL-XL) 100 MG 24 hr tablet TAKE 1 TABLET BY MOUTH DAILY. TAKE WITH OR IMMEDIATELY FOLLOWING A MEAL. 90 tablet 1   sertraline (ZOLOFT) 100 MG tablet Take 100 mg by mouth daily.      No current facility-administered medications for this encounter.    Allergies  Allergen Reactions   Atorvastatin     Other reaction(s): felt bad, draggy, rough.   Celecoxib Itching and Dermatitis   Pravachol [Pravastatin]     Other reaction(s): made him feel bad.    Social History   Socioeconomic History   Marital status: Married    Spouse name: Not on file   Number of children: Not on file   Years of education: Not on file   Highest education level: Not on file  Occupational History   Occupation: retired  Tobacco Use   Smoking status: Former    Types: Cigars   Smokeless tobacco: Never   Tobacco comments:    Quit about 3-4 months ago   Scientific laboratory technician Use: Never used  Substance and Sexual Activity   Alcohol use: No   Drug use: No   Sexual activity: Not on file  Other Topics Concern   Not on file  Social History Narrative   Not on file   Social Determinants of Health   Financial Resource Strain: Not on file  Food Insecurity: Not on file  Transportation Needs: Not on file  Physical Activity: Not on file  Stress: Not on file  Social Connections: Not on file  Intimate Partner Violence: Not on file     ROS- All systems are reviewed and negative except as per the HPI above.  Physical Exam: Vitals:   02/26/21 1027  BP: (!) 122/98  Pulse: 79  Weight: 88.9 kg  Height: '5\' 7"'$  (1.702 m)    GEN- The patient is a well appearing obese male, alert and oriented x 3 today.   HEENT-head normocephalic, atraumatic, sclera clear, conjunctiva pink, hearing intact, trachea midline. Lungs- Clear to  ausculation bilaterally, normal work of breathing Heart- irregular rate and rhythm, no murmurs, rubs or gallops  GI- soft, NT, ND, + BS Extremities- no clubbing, cyanosis, or edema MS- no significant deformity or atrophy Skin- no rash or lesion Psych- euthymic mood, full affect Neuro- strength and sensation are intact   Wt Readings from Last 3 Encounters:  02/26/21 88.9 kg  01/30/21 87.5 kg  11/09/20 87.3 kg    EKG today demonstrates  Afib Vent. rate 79 BPM PR interval * ms QRS duration 100 ms QT/QTcB 416/477 ms  Echo 02/20/21 demonstrated   1. Left ventricular ejection fraction, by estimation, is 30 to 35%. Left  ventricular ejection fraction by PLAX is  33 %. The left ventricle has  moderately decreased function. The left ventricle demonstrates global  hypokinesis. The left ventricular internal cavity size was mildly dilated. Left ventricular diastolic function could not be evaluated.   2. Right ventricular systolic function is mildly reduced. The right  ventricular size is normal. There is normal pulmonary artery systolic  pressure. The estimated right ventricular systolic pressure is [id number] mmHg.   3. Left atrial size was severely dilated.   4. The mitral valve is abnormal. Mild mitral valve regurgitation.   5. The aortic valve is tricuspid. Aortic valve regurgitation is mild.   6. Aortic dilatation noted. There is mild dilatation of the aortic root,  measuring 39 mm.   7. The inferior vena cava is normal in size with greater than 50%  respiratory variability, suggesting right atrial pressure of 3 mmHg.   Comparison(s): Changes from prior study are noted. 03/04/2019: LVEF 30-35%,  global HK.   Epic records are reviewed at length today  Assessment and Plan:  1. Persistent atrial fibrillation Patient having symptoms of increased fatigue with exertion despite good rate control. We discussed therapeutic options today including AAD (dofetilide QTc 42m) and ablation. Patient  agreeable to speaking with EP about ablation. We did discuss that his severely dilated LA may be a disincentive for ablation. He is agreeable to dofetilide admission if he is not felt to be a good ablation candidate. Information about admission given. Continue Eliquis 5 mg BID Continue Toprol 100 mg BID  This patients CHA2DS2-VASc Score and unadjusted Ischemic Stroke Rate (% per year) is equal to 4.8 % stroke rate/year from a score of 4  Above score calculated as 1 point each if present [CHF, HTN, DM, Vascular=MI/PAD/Aortic Plaque, Age if 65-74, or Male] Above score calculated as 2 points each if present [Age > 75, or Stroke/TIA/TE]  2. OSA Encouraged compliance with CPAP therapy. Followed by Dr KClaiborne Billings  3. Combined systolic and diastolic CHF EF [id number] last echo.  No signs or symptoms of fluid overload.  4. HTN Stable, no changes today.  [facility]  No anginal symptoms.   Follow up with Dr CHarrell Gaveas scheduled. Will also refer to EP for consideration of ablation vs dofetilide.    [facility]GMohawk [place] [phone]/07/2020 10:40 AM

## 2021-03-08 NOTE — Progress Notes (Signed)
We can discuss the afib more at his follow up, and I know he has an upcoming appt with Dr. Curt Bears too to discuss long term options. If he starts to see his weight go up or gets more short of breath before our visit, we can get him back in with the afib clinic. I don't know that cardioversion would hold for long but we could try if he feels worse. Thanks.

## 2021-03-19 IMAGING — RF DG HIP (WITH PELVIS) OPERATIVE*R*
1 series · 2 of 2 positions shown · non-contrast
Comparison: None.

CLINICAL DATA: Hip replacement

EXAM:
OPERATIVE RIGHT HIP (WITH PELVIS IF PERFORMED) 1 VIEW
TECHNIQUE: Fluoroscopic spot image(s) were submitted for interpretation
post-operatively.

[Series 1: unknown protocol · 0.20mm/px · 2 of 2 slices shown]
[im 1/2]
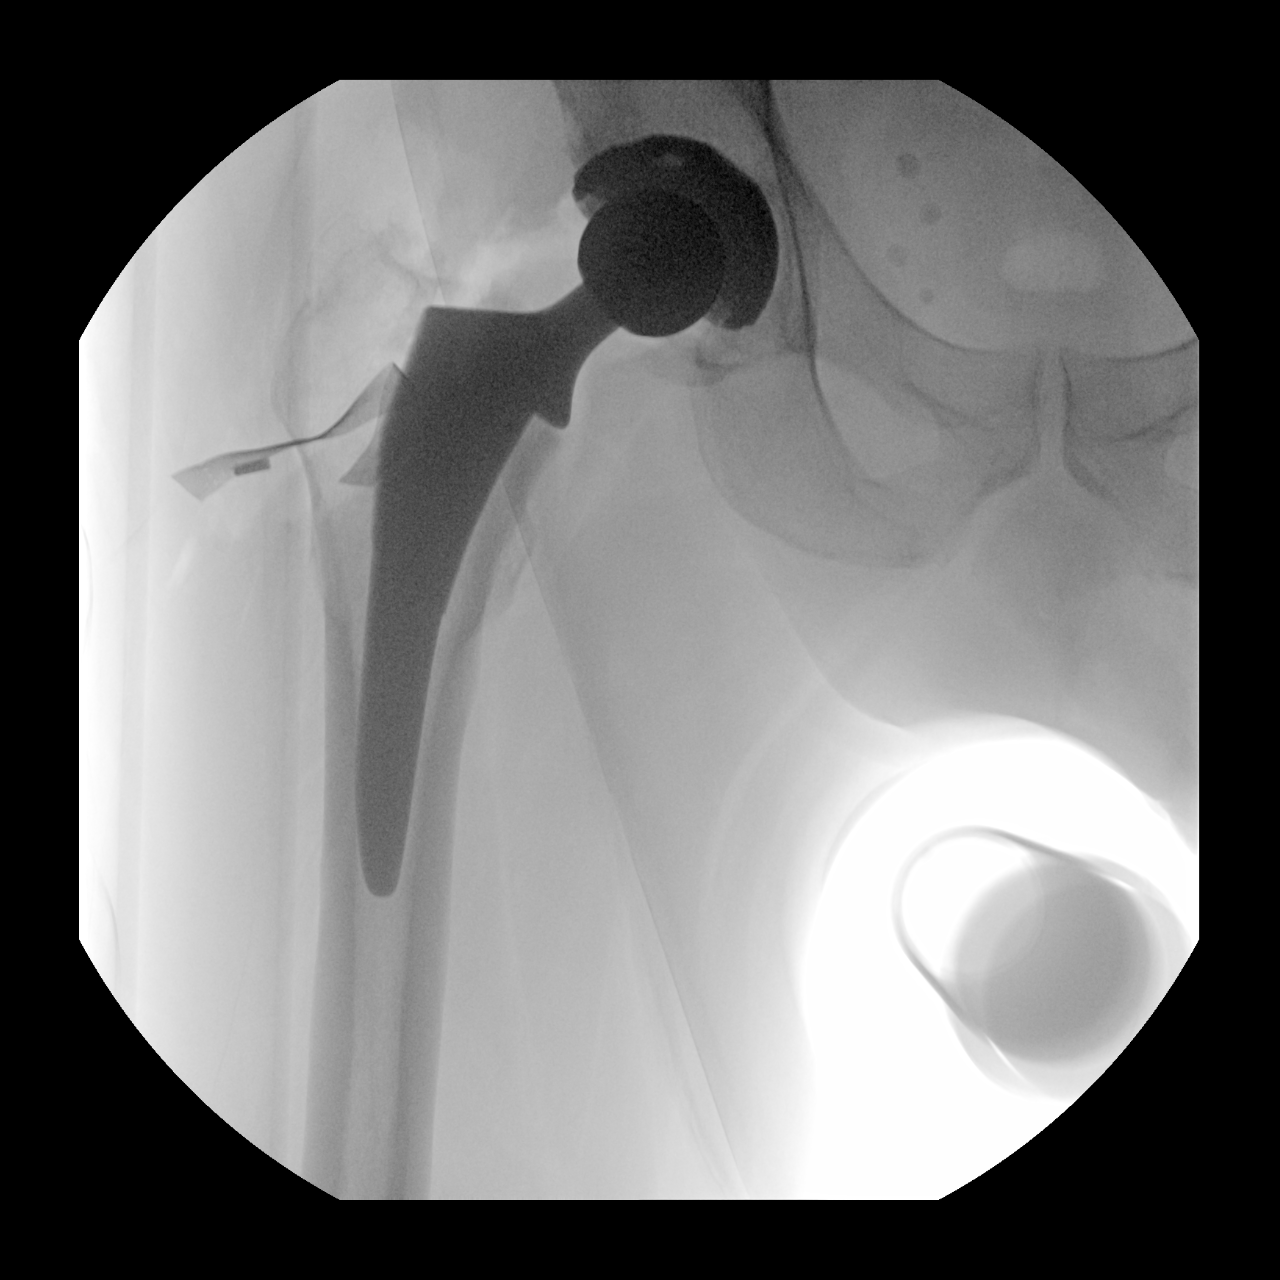
[im 2/2]
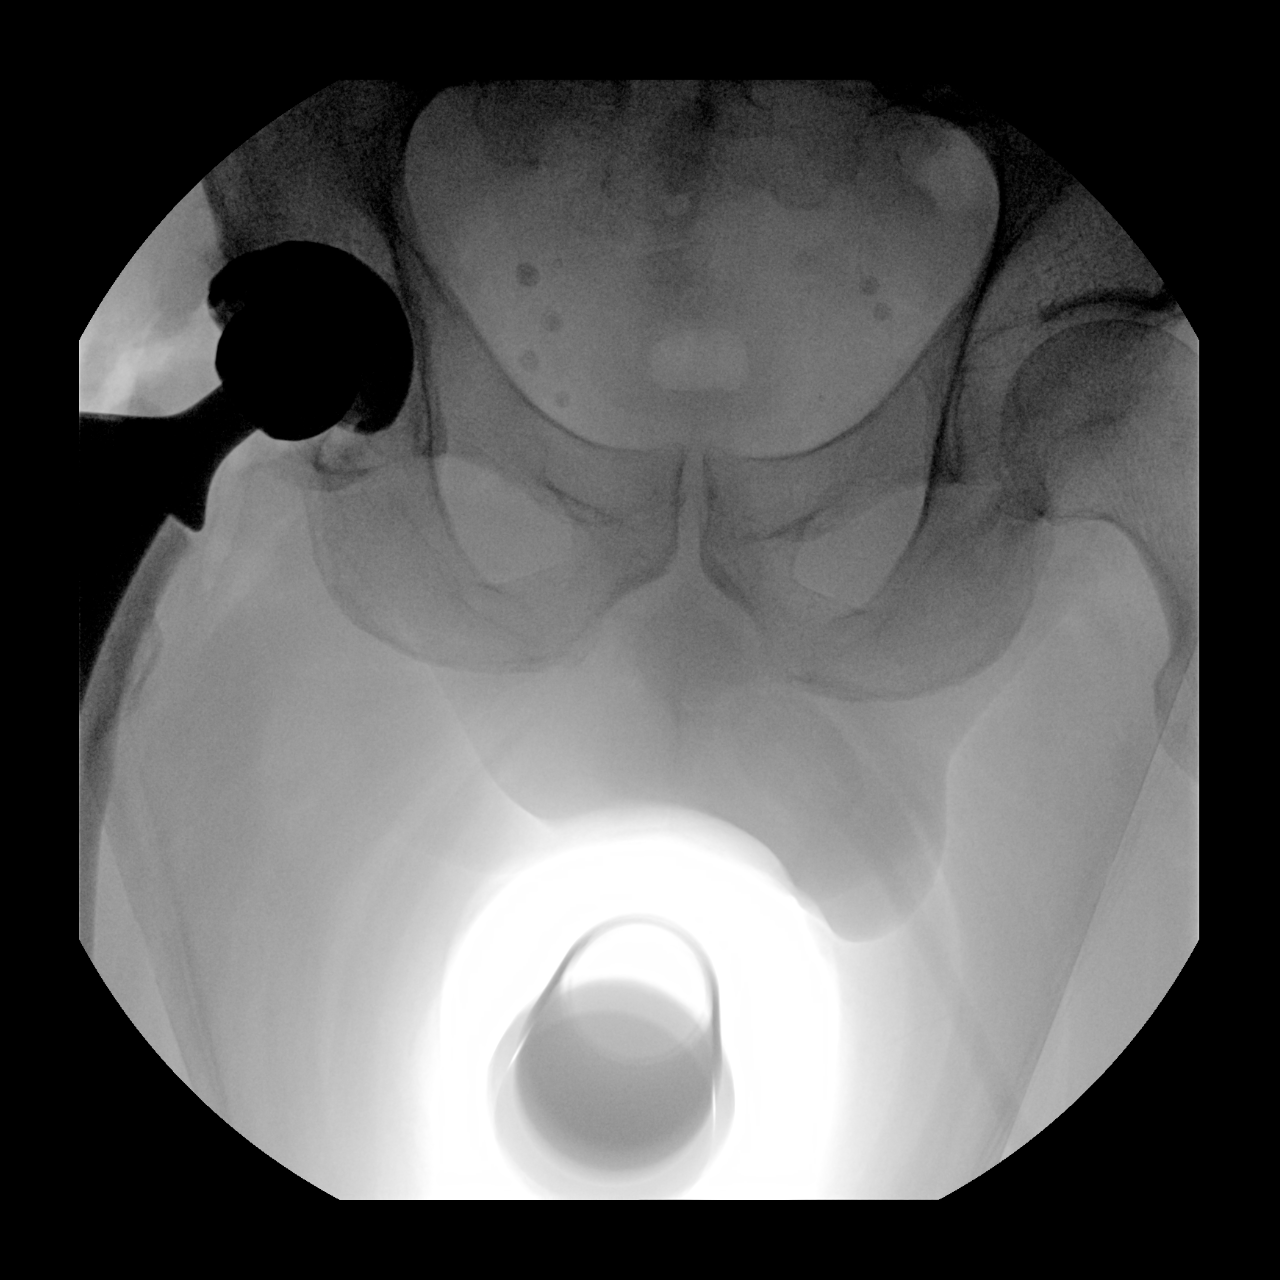

[2 of 2 positions shown; findings below may reference images not displayed]

FINDINGS: Two frontal images of the right hip demonstrate postoperative
changes of total hip arthroplasty. No evidence of complication.
IMPRESSION: Fluoroscopic guidance for right total hip arthroplasty.

## 2021-03-19 IMAGING — DX DG PORTABLE PELVIS
1 series · 1 of 1 positions shown · non-contrast
Comparison: Operative images obtained earlier today.

CLINICAL DATA: Status post right hip total arthroplasty

EXAM:
PORTABLE PELVIS 1-2 VIEWS

[pelvis ap]
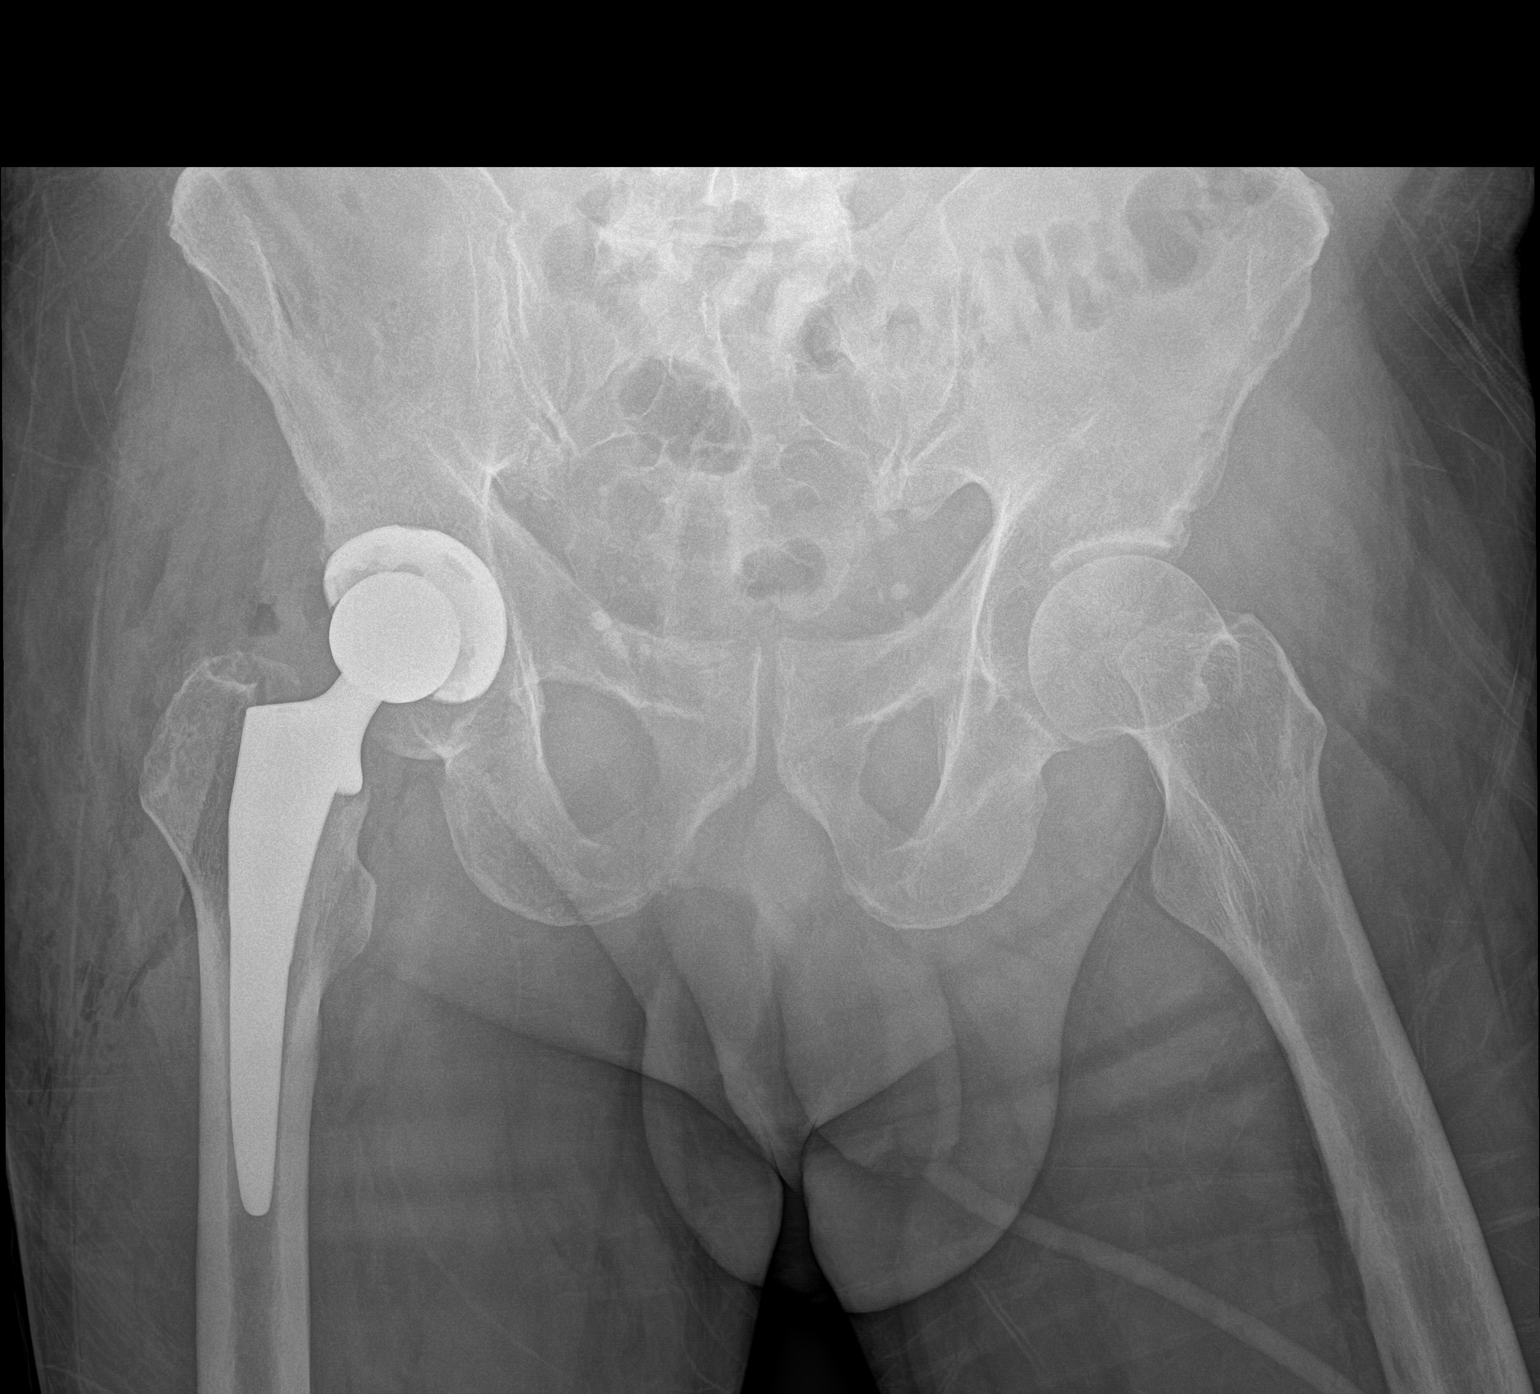

[1 of 1 positions shown; findings below may reference images not displayed]

FINDINGS: The femoral and acetabular components of the total right hip
arthroplasty appear well seated and well aligned on the single AP
supine image. No acute fracture or evidence of an operative
complication.
IMPRESSION: Well-positioned right total hip arthroplasty.

## 2021-03-20 ENCOUNTER — Encounter (HOSPITAL_BASED_OUTPATIENT_CLINIC_OR_DEPARTMENT_OTHER): Payer: Self-pay | Admitting: Cardiology

## 2021-03-20 ENCOUNTER — Other Ambulatory Visit: Payer: Self-pay

## 2021-03-20 ENCOUNTER — Ambulatory Visit (HOSPITAL_BASED_OUTPATIENT_CLINIC_OR_DEPARTMENT_OTHER): Payer: PPO | Admitting: Cardiology

## 2021-03-20 VITALS — BP 142/82 | HR 77 | Ht 67.0 in | Wt 196.2 lb

## 2021-03-20 DIAGNOSIS — E7800 Pure hypercholesterolemia, unspecified: Secondary | ICD-10-CM

## 2021-03-20 DIAGNOSIS — I428 Other cardiomyopathies: Secondary | ICD-10-CM | POA: Diagnosis not present

## 2021-03-20 DIAGNOSIS — I4819 Other persistent atrial fibrillation: Secondary | ICD-10-CM

## 2021-03-20 DIAGNOSIS — I5042 Chronic combined systolic (congestive) and diastolic (congestive) heart failure: Secondary | ICD-10-CM | POA: Diagnosis not present

## 2021-03-20 DIAGNOSIS — I10 Essential (primary) hypertension: Secondary | ICD-10-CM

## 2021-03-20 DIAGNOSIS — D6869 Other thrombophilia: Secondary | ICD-10-CM | POA: Diagnosis not present

## 2021-03-20 DIAGNOSIS — I2510 Atherosclerotic heart disease of native coronary artery without angina pectoris: Secondary | ICD-10-CM

## 2021-03-20 DIAGNOSIS — Z789 Other specified health status: Secondary | ICD-10-CM | POA: Diagnosis not present

## 2021-03-20 DIAGNOSIS — G4733 Obstructive sleep apnea (adult) (pediatric): Secondary | ICD-10-CM | POA: Diagnosis not present

## 2021-03-20 NOTE — Patient Instructions (Addendum)
Medication Instructions:  Your Physician recommend you continue on your current medication as directed. Will discuss Jardiance or Farxiga at next visit.   *If you need a refill on your cardiac medications before your next appointment, please call your pharmacy*   Lab Work: None ordered today   Testing/Procedures: None ordered today   Follow-Up: At [facility], you and your health needs are our priority.  As part of our continuing mission to provide you with exceptional heart care, we have created designated Provider Care Teams.  These Care Teams include your primary Cardiologist (physician) and Advanced Practice Providers (APPs -  Physician Assistants and Nurse Practitioners) who all work together to provide you with the care you need, when you need it.  We recommend signing up for the patient portal called "MyChart".  Sign up information is provided on this After Visit Summary.  MyChart is used to connect with patients for Virtual Visits (Telemedicine).  Patients are able to view lab/test results, encounter notes, upcoming appointments, etc.  Non-urgent messages can be sent to your provider as well.   To learn more about what you can do with MyChart, go to [email or web address].    Your next appointment:   6 week(s)  The format for your next appointment:   In Person  Provider:   Buford Dresser, MD

## 2021-03-20 NOTE — Progress Notes (Signed)
Cardiology Office Note:    Date:  03/20/2021   ID:  Juan Montes, DOB Oct 10, 1950, MRN [id number]  PCP:  Juan Infante, MD  Cardiologist:  Juan Dresser, MD PhD  Referring MD: Juan Infante, MD   CC: follow up  History of Present Illness:    Juan Montes is a 70 y.o. male with a hx of nonischemic cardiomyopathy, chronic systolic and diastolic heart failure, HTN, HLD, atrial fibrillation, prior LV thrombus with resolution (2015),  who is seen for the evaluation and management of his cardiac conditions. He was last seen by Juan Montes on 05/14/18 and previously by Dr. Johnsie Montes in 2015.  Patient concerns today:  He is accompanied by his wife today. We reviewed his symptoms of atrial fibrillation. Overall he thinks he has been in atrial fibrillation for nearly two months. No edema, PND, orthopnea. Does not measure his weight at home.  In the past few days he has been feeling better, but he still has no energy and is very fatigued. There has also been frequent shortness of breath. Normally, he is unable to take the garbage to the side of the street without becoming winded. although yesterday he was able to walk a bit without much issue.   Of note, he usually comes out of Afib if he rests for a while, but this has not been the case lately. He is scheduled to see Juan Montes 04/03/2021.  For his diet, he only uses a small amount of sugar in his coffee. He has cut out most of his alcohol consumption, and he is a non-smoker.   Recently he began using his CPAP again. He deferred use while recovering from his hip replacement.  He denies any chest pain, LE edema, lightheadedness, headaches, syncope, orthopnea, or PND.   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   Elevated fasting blood sugar 12/19/2009   Fatigue 12/19/2009   GERD (gastroesophageal reflux disease)    HLD (hyperlipidemia) 12/19/2009   HTN (hypertension)    Sleep apnea     Past Surgical History:  Procedure Laterality Date   HERNIA REPAIR     KNEE SURGERY Left    LEFT HEART CATH AND CORONARY ANGIOGRAPHY N/A 04/15/2018   Procedure: LEFT HEART CATH AND CORONARY ANGIOGRAPHY;  Surgeon: Leonie Man, MD;  Location: [facility];  Service: Cardiovascular;  Laterality: N/A;   TONSILLECTOMY     TOTAL HIP ARTHROPLASTY Right 06/14/2020   Procedure: TOTAL HIP ARTHROPLASTY ANTERIOR APPROACH;  Surgeon: Gaynelle Arabian, MD;  Location: [facility];  Service: Orthopedics;  Laterality: Right;   WISDOM TOOTH EXTRACTION      Current Medications: Current Outpatient Medications on File Prior to Visit  Medication Sig   acetaminophen (TYLENOL) 650 MG CR tablet Take 1,300 mg by mouth every 8 (eight) hours as needed for pain.    amLODipine (NORVASC) 5 MG tablet TAKE 1 TABLET(5 MG) BY MOUTH DAILY   apixaban (ELIQUIS) 5 MG TABS tablet Take 5 mg by mouth 2 (two) times daily.   buPROPion (WELLBUTRIN XL) 150 MG 24 hr tablet Take 150 mg by mouth every morning.   calcium carbonate (TUMS - DOSED IN MG ELEMENTAL CALCIUM) 500 MG chewable tablet Chew 1-2 tablets by  mouth 3 (three) times daily as needed for indigestion or heartburn.    Cholecalciferol (VITAMIN D3) 2000 units TABS Take 2,000 Units by mouth daily.   ENTRESTO 97-103 MG TAKE 1 TABLET BY MOUTH TWICE DAILY   ezetimibe (ZETIA) 10 MG tablet Take 10 mg by mouth daily.   methocarbamol (ROBAXIN) 500 MG tablet Take 1 tablet (500 mg total) by mouth every 6 (six) hours as needed for muscle spasms.   metoprolol succinate (TOPROL-XL) 100 MG 24 hr tablet TAKE 1 TABLET BY MOUTH DAILY. TAKE WITH OR IMMEDIATELY FOLLOWING A MEAL.   sertraline (ZOLOFT) 100 MG tablet Take 100 mg by mouth daily.    No current  facility-administered medications on file prior to visit.     Allergies:   Atorvastatin, Celecoxib, and Pravachol [pravastatin]   Social History   Tobacco Use   Smoking status: Former    Types: Cigars   Smokeless tobacco: Never   Tobacco comments:    Quit about 3-4 months ago   Vaping Use   Vaping Use: Never used  Substance Use Topics   Alcohol use: No   Drug use: No      Family History: [name] includes Heart attack (age of onset: 85) in his brother; Hypertension in his brother and father; Kidney Stones in his mother. Father has severe CAD, med mgmt only option.  ROS:   Please see the history of present illness. (+) Fatigue (+) Shortness of breath All other systems are reviewed and negative.    EKGs/Labs/Other Studies Reviewed:    The following studies were reviewed today:  Echo 02/20/2021: 1. Left ventricular ejection fraction, by estimation, is 30 to 35%. Left  ventricular ejection fraction by PLAX is 33 %. The left ventricle has  moderately decreased function. The left ventricle demonstrates global  hypokinesis. The left ventricular  internal cavity size was mildly dilated. Left ventricular diastolic  function could not be evaluated.   2. Right ventricular systolic function is mildly reduced. The right  ventricular size is normal. There is normal pulmonary artery systolic  pressure. The estimated right ventricular systolic pressure is [id number] mmHg.   3. Left atrial size was severely dilated.   4. The mitral valve is abnormal. Mild mitral valve regurgitation.   5. The aortic valve is tricuspid. Aortic valve regurgitation is mild.   6. Aortic dilatation noted. There is mild dilatation of the aortic root,  measuring 39 mm.   7. The inferior vena cava is normal in size with greater than 50%  respiratory variability, suggesting right atrial pressure of 3 mmHg.   Comparison(s): Changes from prior study are noted. 03/04/2019: LVEF 30-35%,  global HK.    Echo 03/04/19  1. The left ventricle has moderate-severely reduced systolic function, with an ejection fraction of 30-35%. The cavity size was mildly dilated. Left ventricular diastolic Doppler parameters are consistent with pseudonormalization. Left ventricular  diffuse hypokinesis.  2. The right ventricle has normal systolic function. The cavity was normal.  3. Left atrial size was moderately dilated.  4. The mitral valve is abnormal. Mild thickening of the mitral valve leaflet.  5. The tricuspid valve is grossly normal.  6. The aortic valve is tricuspid. Mild thickening of the aortic valve. Aortic valve regurgitation is mild by color flow Doppler. No stenosis of the aortic valve.  7. The aorta is abnormal in size and structure.  8. There is mild dilatation of the aortic root measuring 40 mm.  9. Moderate to severe global reduction  in LV systolic function; mild LVE; moderate diastolic dysfunction; moderate LAE; mildy dilated aortic root; mild AI; GLS-12.3%. Note cannot exclude apical thrombus; suggest FU limited study with definity to furter  Assess. (my comments: no thrombus seen, just not excluded. He is already on anticoagulation, so definity study not pursued)    Echo 08/11/18 - Left ventricle: The cavity size was mildly dilated. There was   mild focal basal hypertrophy of the septum. Systolic function was   moderately to severely reduced. The estimated ejection fraction   was in the range of 30% to 35%. Diffuse hypokinesis. Doppler   parameters are consistent with abnormal left ventricular   relaxation (grade 1 diastolic dysfunction). - Aortic valve: There was trivial regurgitation. - Aortic root: The aortic root was mildly dilated. - Ascending aorta: The ascending aorta was mildly dilated.   Impressions: - Moderate to severe global reduction in LV systolic function; mild   diastolic dysfunction; mild LVE; trace AI; mildly dilated aortic   root/ascending aorta.    ECHO:  03/16/2018 -The cavity size was mildly dilated. Systolic   function was mildly reduced. The estimated ejection fraction was   in the range of 45% to 50%. Diffuse hypokinesis. Doppler   parameters are consistent with abnormal left ventricular   relaxation (grade 1 diastolic dysfunction). - Aortic valve: There was trivial regurgitation. - Aorta: Aortic root dimension: 40 mm (ED). - Ascending aorta: The ascending aorta was mildly dilated. - Mitral valve: Mildly thickened leaflets . There was trivial   regurgitation. - Right ventricle: The cavity size was mildly dilated. [place]   thickness was normal.    CATH: 04/15/2018 Lat Ramus lesion is 40% stenosed. Otherwise minimal CAD. There is mild left ventricular systolic dysfunction. The left ventricular ejection fraction is 45-50% by visual estimate.   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  This could potentially be the reason for his symptoms, but not anginal equivalent.    EKG:  EKG is personally reviewed.   03/20/2021: Atrial fibrillation with a PVC vs. Aberrant conduction. Rate 77 bpm. 03/30/2019: EKG was not ordered. 02/25/19: NSR, narrow QRS  Recent Labs: 01/31/2021: ALT 12; BUN 18; Creatinine, Ser 1.25; Hemoglobin 15.6; Magnesium 2.0; Platelets 195; Potassium 5.0; Sodium 141; TSH 2.750  Recent Lipid Panel    Component Value Date/Time   CHOL 158 01/31/2021 0915   TRIG 99 01/31/2021 0915   HDL 55 01/31/2021 0915   CHOLHDL 2.9 01/31/2021 0915   LDLCALC 85 01/31/2021 0915    Physical Exam:    VS:  BP (!) 142/82   Pulse 77   Ht '5\' 7"'$  (1.702 m)   Wt 196 lb 3.2 oz (89 kg)   SpO2 97%   BMI 30.73 kg/m     Wt Readings from Last 3 Encounters:  03/20/21 196 lb 3.2 oz (89 kg)  02/26/21 196 lb (88.9 kg)  01/30/21 192 lb 12.8 oz (87.5 kg)    GEN: Well nourished, well developed in no acute distress HEENT: Normal, moist mucous membranes NECK: No JVD CARDIAC: irregularly irregular rhythm, normal S1 and S2, no rubs or  gallops. No murmur. VASCULAR: Radial and DP pulses 2+ bilaterally. No carotid bruits RESPIRATORY:  Clear to auscultation without rales, wheezing or rhonchi  ABDOMEN: Soft, non-tender, non-distended MUSCULOSKELETAL:  Ambulates independently SKIN: Warm and dry, no edema NEUROLOGIC:  Alert and oriented x 3. No focal neuro deficits noted. PSYCHIATRIC:  Normal affect    ASSESSMENT:    1. Persistent atrial fibrillation ([place])   2.  Secondary hypercoagulable state ([place])   3. NICM (nonischemic cardiomyopathy) ([place])   4. Essential hypertension   5. Chronic combined systolic and diastolic heart failure ([place])   6. OSA (obstructive sleep apnea)   7. Nonocclusive coronary atherosclerosis of native coronary artery   8. Pure hypercholesterolemia   9. Statin intolerance     PLAN:    Chronic combined systolic and diastolic heart failure: due to nonischemic cardiomyopathy -NYHA class II,  -continue metoprolol succinate 100 mg daily -continue entresto max dose 97-103 BID -EF stable but not improved. Discussed ICD again, also discussed atrial fib in heart failure. Has pending appt with Juan Montes 04/03/21 -counseled on red flag warning signs that need immediate medical attention -we discussed SGLT2i today. He is interested but would like to wait until his atrial fibrillation can be discussed with Juan Montes. -heart failure education reviewed, encouraged daily weights  Hypertension:  -continue metoprolol, entresto as above -continue amlodipine -recheck at follow up  History of atrial fibrillation: now persistent  -CHA2DS2/VAS Stroke Risk Points=4 -continue apixaban for afib stroke prevention.  -we reviewed his echo. Discussed left atrial dilation, how it happens, how it impacts occurrence/management of atrial fibrillation -we did discuss cardioversion today, though discussed that he is more likely to hold sinus rhythm if he has ablation or is started on antiarrhythmic first. He would like to defer  cardioversion until he can discuss long term management strategy with Juan Montes.   OSA: using CPAP, encouraged this  Nonobstructive CAD Hypercholesterolemia, LDL goal <70 -last LDL 85, on ezetimibe. Declines statins, as he has had adverse events on atorvastatin and pravastatin  Prevention: -recommend heart healthy/Mediterranean diet, with whole grains, fruits, vegetable, fish, lean meats, nuts, and olive oil. Limit salt. -recommend moderate walking, 3-5 times/week for 30-50 minutes each session. Aim for at least 150 minutes.week. Goal should be pace of 3 miles/hours, or walking 1.5 miles in 30 minutes -recommend avoidance of tobacco products. Avoid excess alcohol.  Plan for follow up: 6 weeks or sooner as needed.  Total time of encounter: 46 minutes total time of encounter, including 31 minutes spent in face-to-face patient care. This time includes coordination of care and counseling regarding atrial fibrillation, options for management, and relationship to heart failure. Remainder of non-face-to-face time involved reviewing chart documents/testing relevant to the patient encounter and documentation in the medical record.  Juan Dresser, MD, PhD, [facility]    Medication Adjustments/Labs and Tests Ordered: Current medicines are reviewed at length with the patient today.  Concerns regarding medicines are outlined above.   Orders Placed This Encounter  Procedures   EKG 12-Lead    No orders of the defined types were placed in this encounter.  Patient Instructions  Medication Instructions:  Your Physician recommend you continue on your current medication as directed. Will discuss Jardiance or Farxiga at next visit.   *If you need a refill on your cardiac medications before your next appointment, please call your pharmacy*   Lab Work: None ordered today   Testing/Procedures: None ordered today   Follow-Up: At [facility], you and your  health needs are our priority.  As part of our continuing mission to provide you with exceptional heart care, we have created designated Provider Care Teams.  These Care Teams include your primary Cardiologist (physician) and Advanced Practice Providers (APPs -  Physician Assistants and Nurse Practitioners) who all work together to provide you with the care you need, when you need it.  We recommend signing  up for the patient portal called "MyChart".  Sign up information is provided on this After Visit Summary.  MyChart is used to connect with patients for Virtual Visits (Telemedicine).  Patients are able to view lab/test results, encounter notes, upcoming appointments, etc.  Non-urgent messages can be sent to your provider as well.   To learn more about what you can do with MyChart, go to [email or web address].    Your next appointment:   6 week(s)  The format for your next appointment:   In Person  Provider:   Buford Dresser, MD      [facility],acting as a scribe for Juan Dresser, MD.,have documented all relevant documentation on the behalf of Juan Dresser, MD,as directed by  Juan Dresser, MD while in the presence of Juan Dresser, MD.  I, Juan Dresser, MD, have reviewed all documentation for this visit. The documentation on 03/20/21 for the exam, diagnosis, procedures, and orders are all accurate and complete.   Signed, Juan Dresser, MD PhD 03/20/2021 10:54 AM    [place]

## 2021-04-03 ENCOUNTER — Other Ambulatory Visit: Payer: Self-pay

## 2021-04-03 ENCOUNTER — Encounter: Payer: Self-pay | Admitting: Cardiology

## 2021-04-03 ENCOUNTER — Ambulatory Visit: Payer: PPO | Admitting: Cardiology

## 2021-04-03 VITALS — BP 128/70 | HR 80 | Ht 67.0 in | Wt 195.2 lb

## 2021-04-03 DIAGNOSIS — I4819 Other persistent atrial fibrillation: Secondary | ICD-10-CM | POA: Diagnosis not present

## 2021-04-03 DIAGNOSIS — Z01812 Encounter for preprocedural laboratory examination: Secondary | ICD-10-CM | POA: Diagnosis not present

## 2021-04-03 DIAGNOSIS — Z01818 Encounter for other preprocedural examination: Secondary | ICD-10-CM | POA: Diagnosis not present

## 2021-04-03 LAB — CBC
Hematocrit: 46.8 % (ref 37.5–51.0)
Hemoglobin: 15.7 g/dL (ref 13.0–17.7)
MCH: 31.7 pg (ref 26.6–33.0)
MCHC: 33.5 g/dL (ref 31.5–35.7)
MCV: 94 fL (ref 79–97)
Platelets: 178 10*3/uL (ref 150–450)
RBC: 4.96 x10E6/uL (ref 4.14–5.80)
RDW: 12.1 % (ref 11.6–15.4)
WBC: 5.2 10*3/uL (ref 3.4–10.8)

## 2021-04-03 LAB — BASIC METABOLIC PANEL
BUN/Creatinine Ratio: 12 (ref 10–24)
BUN: 14 mg/dL (ref 8–27)
CO2: 24 mmol/L (ref 20–29)
Calcium: 9.2 mg/dL (ref 8.6–10.2)
Chloride: 103 mmol/L (ref 96–106)
Creatinine, Ser: 1.13 mg/dL (ref 0.76–1.27)
Glucose: 96 mg/dL (ref 65–99)
Potassium: 4.3 mmol/L (ref 3.5–5.2)
Sodium: 140 mmol/L (ref 134–144)
eGFR: 70 mL/min/{1.73_m2} (ref >59)

## 2021-04-03 NOTE — Patient Instructions (Signed)
Medication Instructions:  Your physician recommends that you continue on your current medications as directed. Please refer to the Current Medication list given to you today.  *If you need a refill on your cardiac medications before your next appointment, please call your pharmacy*   Lab Work: Pre procedure labs today:  BMP & CBC  If you have labs (blood work) drawn today and your tests are completely normal, you will receive your results only by: [place] (if you have MyChart) OR A paper copy in the mail If you have any lab test that is abnormal or we need to change your treatment, we will call you to review the results.   Testing/Procedures: Your physician has requested that you have cardiac CT within 7 days PRIOR to your ablation. Cardiac computed tomography (CT) is a painless test that uses an x-ray machine to take clear, detailed pictures of your heart.  Please follow instruction below located under "other instructions". You will get a call from our office to schedule the date for this test.  Your physician has recommended that you have an ablation. Catheter ablation is a medical procedure used to treat some cardiac arrhythmias (irregular heartbeats). During catheter ablation, a long, thin, flexible tube is put into a blood vessel in your groin (upper thigh), or neck. This tube is called an ablation catheter. It is then guided to your heart through the blood vessel. Radio frequency waves destroy small areas of heart tissue where abnormal heartbeats may cause an arrhythmia to start. Please follow instruction below located under "other instructions".   Follow-Up: At [facility], you and your health needs are our priority.  As part of our continuing mission to provide you with exceptional heart care, we have created designated Provider Care Teams.  These Care Teams include your primary Cardiologist (physician) and Advanced Practice Providers (APPs -  Physician Assistants and Nurse  Practitioners) who all work together to provide you with the care you need, when you need it.  We recommend signing up for the patient portal called "MyChart".  Sign up information is provided on this After Visit Summary.  MyChart is used to connect with patients for Virtual Visits (Telemedicine).  Patients are able to view lab/test results, encounter notes, upcoming appointments, etc.  Non-urgent messages can be sent to your provider as well.   To learn more about what you can do with MyChart, go to [email or web address].    Your next appointment:   1 month(s) after your ablation  The format for your next appointment:   In Person  Provider:   AFib clinic   Thank you for choosing [facility]!!   Trinidad Curet, RN [phone]    Other Instructions   CT INSTRUCTIONS Your cardiac CT will be scheduled at:  [facility] [address] [place], [place] [address] [phone]  Please arrive at the [facility] main entrance (entrance A) of [facility] 30 minutes prior to test start time. Proceed to the [facility] Radiology Department (first floor) to check-in and test prep.  Please follow these instructions carefully (unless otherwise directed):  Hold all erectile dysfunction medications at least 3 days (72 hrs) prior to test.  On the Night Before the Test: Be sure to Drink plenty of water. Do not consume any caffeinated/decaffeinated beverages or chocolate 12 hours prior to your test. Do not take any antihistamines 12 hours prior to your test.  On the Day of the Test: Drink plenty of water until 1  hour prior to the test. Do not eat any food 4 hours prior to the test. You may take your regular medications prior to the test.  HOLD Furosemide/Hydrochlorothiazide morning of the test. FEMALES- please wear underwire-free bra if available      After the Test: Drink plenty of water. After receiving IV contrast, you may experience a mild flushed  feeling. This is normal. On occasion, you may experience a mild rash up to 24 hours after the test. This is not dangerous. If this occurs, you can take Benadryl 25 mg and increase your fluid intake. If you experience trouble breathing, this can be serious. If it is severe call 911 IMMEDIATELY. If it is mild, please call our office. If you take any of these medications: Glipizide/Metformin, Avandament, Glucavance, please do not take 48 hours after completing test unless otherwise instructed.   Once we have confirmed authorization from your insurance company, we will call you to set up a date and time for your test. Based on how quickly your insurance processes prior authorizations requests, please allow up to 4 weeks to be contacted for scheduling your Cardiac CT appointment. Be advised that routine Cardiac CT appointments could be scheduled as many as 8 weeks after your provider has ordered it.  For non-scheduling related questions, please contact the cardiac imaging nurse navigator should you have any questions/concerns: Marchia Bond, Cardiac Imaging Nurse Navigator Gordy Clement, Cardiac Imaging Nurse Navigator [facility] Direct Office Dial: [phone]   For scheduling needs, including cancellations and rescheduling, please call [place], [phone].     Electrophysiology/Ablation Procedure Instructions   You are scheduled for a(n)  ablation on 04/25/2021 with Dr. Allegra Lai.   1.   Pre procedure testing-             A.  LAB WORK --- On 04/03/2021 for your pre procedure blood work.  You do NOT need to be fasting.   On the day of your procedure 04/25/2021 you will go to [facility] [phone] [address]) at 9:30 am.  Dennis Bast will go to the main entrance A [employer]) and enter where the [employer] are.  Your driver will drop you off and you will head down the hallway to ADMITTING.  You may have one support person come in to the hospital with you.   They will be asked to wait in the waiting room. It is OK to have someone drop you off and come back when you are ready to be discharged.   3.   Do not eat or drink after midnight prior to your procedure.   4.   On the morning of your procedure do NOT take any medication. Do not miss any doses of your blood thinner prior to the morning of your procedure or your procedure will need to be rescheduled.   5.  Plan for an overnight stay but you may be discharged after your procedure, if you use your phone frequently bring your phone charger. If you are discharged after your procedure you will need someone to drive you home and be with you for 24 hours after your procedure.   6. You will follow up with the AFIB clinic 4 weeks after your procedure.  You will follow up with Dr. Curt Bears  3 months after your procedure.  These appointments will be made for you.   7. FYI: For your safety, and to allow [place] to monitor your vital signs accurately during the  surgery/procedure we request that if you have artificial nails, gel coating, SNS etc. Please have those removed prior to your surgery/procedure. Not having the nail coverings /polish removed may result in cancellation or delay of your surgery/procedure.  * If you have ANY questions please call the office [phone] and ask for Sreshta Cressler RN or send me a MyChart message   * Occasionally, EP Studies and ablations can become lengthy.  Please make your family aware of this before your procedure starts.  Average time ranges from 2-8 hours for EP studies/ablations.  Your physician will call your family after the procedure with the results.                                   Cardiac Ablation Cardiac ablation is a procedure to destroy, or ablate, a small amount of heart tissue in very specific places. The heart has many electrical connections. Sometimes these connections are abnormal and can cause the heart to beat very fast or irregularly. Ablating some of the areas  that cause problems can improve the heart's rhythm or return it to normal. Ablation may be done for people who: Have Wolff-Parkinson-White syndrome. Have fast heart rhythms (tachycardia). Have taken medicines for an abnormal heart rhythm (arrhythmia) that were not effective or caused side effects. Have a high-risk heartbeat that may be life-threatening. During the procedure, a small incision is made in the neck or the groin, and a long, thin tube (catheter) is inserted into the incision and moved to the heart. Small devices (electrodes) on the tip of the catheter will send out electrical currents. A type of X-ray (fluoroscopy) will be used to help guide the catheter and to provide images of the heart. Tell a health care provider about: Any allergies you have. All medicines you are taking, including vitamins, herbs, eye drops, creams, and over-the-counter medicines. Any problems you or family members have had with anesthetic medicines. Any blood disorders you have. Any surgeries you have had. Any medical conditions you have, such as kidney failure. Whether you are pregnant or may be pregnant. What are the risks? Generally, this is a safe procedure. However, problems may occur, including: Infection. Bruising and bleeding at the catheter insertion site. Bleeding into the chest, especially into the sac that surrounds the heart. This is a serious complication. Stroke or blood clots. Damage to nearby structures or organs. Allergic reaction to medicines or dyes. Need for a permanent pacemaker if the normal electrical system is damaged. A pacemaker is a small computer that sends electrical signals to the heart and helps your heart beat normally. The procedure not being fully effective. This may not be recognized until months later. Repeat ablation procedures are sometimes done. What happens before the procedure? Medicines Ask your health care provider about: Changing or stopping your regular  medicines. This is especially important if you are taking diabetes medicines or blood thinners. Taking medicines such as aspirin and ibuprofen. These medicines can thin your blood. Do not take these medicines unless your health care provider tells you to take them. Taking over-the-counter medicines, vitamins, herbs, and supplements. General instructions Follow instructions from your health care provider about eating or drinking restrictions. Plan to have someone take you home from the hospital or clinic. If you will be going home right after the procedure, plan to have someone with you for 24 hours. Ask your health care provider what steps will be taken to  prevent infection. What happens during the procedure?  An IV will be inserted into one of your veins. You will be given a medicine to help you relax (sedative). The skin on your neck or groin will be numbed. An incision will be made in your neck or your groin. A needle will be inserted through the incision and into a large vein in your neck or groin. A catheter will be inserted into the needle and moved to your heart. Dye may be injected through the catheter to help your surgeon see the area of the heart that needs treatment. Electrical currents will be sent from the catheter to ablate heart tissue in desired areas. There are three types of energy that may be used to do this: Heat (radiofrequency energy). Laser energy. Extreme cold (cryoablation). When the tissue has been ablated, the catheter will be removed. Pressure will be held on the insertion area to prevent a lot of bleeding. A bandage (dressing) will be placed over the insertion area. The exact procedure may vary among health care providers and hospitals. What happens after the procedure? Your blood pressure, heart rate, breathing rate, and blood oxygen level will be monitored until you leave the hospital or clinic. Your insertion area will be monitored for bleeding. You will  need to lie still for a few hours to ensure that you do not bleed from the insertion area. Do not drive for 24 hours or as long as told by your health care provider. Summary Cardiac ablation is a procedure to destroy, or ablate, a small amount of heart tissue using an electrical current. This procedure can improve the heart rhythm or return it to normal. Tell your health care provider about any medical conditions you may have and all medicines you are taking to treat them. This is a safe procedure, but problems may occur. Problems may include infection, bruising, damage to nearby organs or structures, or allergic reactions to medicines. Follow your health care provider's instructions about eating and drinking before the procedure. You may also be told to change or stop some of your medicines. After the procedure, do not drive for 24 hours or as long as told by your health care provider. This information is not intended to replace advice given to you by your health care provider. Make sure you discuss any questions you have with your health care provider. Document Revised: 05/24/2019 Document Reviewed: 05/24/2019 Elsevier Patient Education  [place].

## 2021-04-03 NOTE — Progress Notes (Signed)
Electrophysiology Office Note   Date:  04/03/2021   ID:  Juan Montes, DOB [birth year], MRN [id number]  PCP:  Crist Infante, MD  Cardiologist:  [name] Primary Electrophysiologist:  Rielly Brunn Meredith Leeds, MD    Chief Complaint: AF   History of Present Illness: Juan Montes is a 70 y.o. male who is being seen today for the evaluation of AF at the request of Fenton, Clint R, PA. Presenting today for electrophysiology evaluation.  He has a history significant for chronic systolic heart failure, hypertension, OSA, hyperlipidemia, and persistent atrial fibrillation.  He was diagnosed with atrial fibrillation by at a work event.  This was thought to be stress related.  The patient went to his PCP and was found to be in atrial fibrillation along with symptoms of fatigue and chest discomfort.  He was started on Eliquis.  He was diagnosed in 2020.  At his most recent cardiology visit, he was found to have continued fatigue and shortness of breath.  Today, he denies symptoms of palpitations, chest pain, shortness of breath, orthopnea, PND, lower extremity edema, claudication, dizziness, presyncope, syncope, bleeding, or neurologic sequela. The patient is tolerating medications without difficulties.  He is not doing all of his daily activities due to his level of fatigue.  He does not have much in the way of shortness of breath.  His only symptom is fatigue from his atrial fibrillation.  Prior to going into atrial fibrillation, he felt well without major complaint.   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   Elevated fasting blood sugar 12/19/2009   Fatigue 12/19/2009   GERD (gastroesophageal reflux disease)    HLD (hyperlipidemia)  12/19/2009   HTN (hypertension)    Sleep apnea    Past Surgical History:  Procedure Laterality Date   HERNIA REPAIR     KNEE SURGERY Left    LEFT HEART CATH AND CORONARY ANGIOGRAPHY N/A 04/15/2018   Procedure: LEFT HEART CATH AND CORONARY ANGIOGRAPHY;  Surgeon: Leonie Man, MD;  Location: [facility];  Service: Cardiovascular;  Laterality: N/A;   TONSILLECTOMY     TOTAL HIP ARTHROPLASTY 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      amLODipine (NORVASC) 5 MG tablet TAKE 1 TABLET(5 MG) BY MOUTH DAILY 90 tablet 3   apixaban (ELIQUIS) 5 MG TABS tablet Take 5 mg by mouth 2 (two) times daily.     buPROPion (WELLBUTRIN XL) 150 MG 24 hr tablet Take 150 mg by mouth every morning.     calcium carbonate (TUMS - DOSED IN MG ELEMENTAL CALCIUM) 500 MG chewable tablet Chew 1-2 tablets by mouth 3 (three) times daily as needed for indigestion or heartburn.      Cholecalciferol (VITAMIN D3) 2000 units TABS Take 2,000 Units by mouth daily.     ENTRESTO 97-103 MG TAKE 1 TABLET BY MOUTH TWICE DAILY 60 tablet 6  ezetimibe (ZETIA) 10 MG tablet Take 10 mg by mouth daily.     methocarbamol (ROBAXIN) 500 MG tablet Take 1 tablet (500 mg total) by mouth every 6 (six) hours as needed for muscle spasms. 40 tablet 0   metoprolol succinate (TOPROL-XL) 100 MG 24 hr tablet TAKE 1 TABLET BY MOUTH DAILY. TAKE WITH OR IMMEDIATELY FOLLOWING A MEAL. 90 tablet 1   sertraline (ZOLOFT) 100 MG tablet Take 100 mg by mouth daily.      No current facility-administered medications for this visit.    Allergies:   Atorvastatin, Celecoxib, and Pravachol [pravastatin]   Social History:  The patient   reports that he has quit smoking. His smoking use included cigars. He has never used smokeless tobacco. He reports that he does not drink alcohol and does not use drugs.   Family History:  [name] includes Heart attack (age of onset: 46) in his brother; Hypertension in his brother and father; Kidney Stones in his mother.    ROS:  Please see the history of present illness.   Otherwise, review of systems is positive for none.   All other systems are reviewed and negative.    PHYSICAL EXAM: VS:  BP 128/70   Pulse 80   Ht '5\' 7"'$  (1.702 m)   Wt 195 lb 3.2 oz (88.5 kg)   SpO2 98%   BMI 30.57 kg/m  , BMI Body mass index is 30.57 kg/m. GEN: Well nourished, well developed, in no acute distress  HEENT: normal  Neck: no JVD, carotid bruits, or masses Cardiac: irregular; no murmurs, rubs, or gallops,no edema  Respiratory:  clear to auscultation bilaterally, normal work of breathing GI: soft, nontender, nondistended, + BS MS: no deformity or atrophy  Skin: warm and dry Neuro:  Strength and sensation are intact Psych: euthymic mood, full affect  EKG:  EKG is not ordered today. Personal review of the ekg ordered 03/20/21 shows atrial fibrillation, rate 77  Recent Labs: 01/31/2021: ALT 12; BUN 18; Creatinine, Ser 1.25; Hemoglobin 15.6; Magnesium 2.0; Platelets 195; Potassium 5.0; Sodium 141; TSH 2.750    Lipid Panel     Component Value Date/Time   CHOL 158 01/31/2021 0915   TRIG 99 01/31/2021 0915   HDL 55 01/31/2021 0915   CHOLHDL 2.9 01/31/2021 0915   LDLCALC 85 01/31/2021 0915     Wt Readings from Last 3 Encounters:  04/03/21 195 lb 3.2 oz (88.5 kg)  03/20/21 196 lb 3.2 oz (89 kg)  02/26/21 196 lb (88.9 kg)      Other studies Reviewed: Additional studies/ records that were reviewed today include: TTE 02/20/21  Review of the above records today demonstrates:   1. Left ventricular ejection fraction, by estimation, is 30 to 35%. Left  ventricular ejection  fraction by PLAX is 33 %. The left ventricle has  moderately decreased function. The left ventricle demonstrates global  hypokinesis. The left ventricular  internal cavity size was mildly dilated. Left ventricular diastolic  function could not be evaluated.   2. Right ventricular systolic function is mildly reduced. The right  ventricular size is normal. There is normal pulmonary artery systolic  pressure. The estimated right ventricular systolic pressure is [id number] mmHg.   3. Left atrial size was severely dilated.   4. The mitral valve is abnormal. Mild mitral valve regurgitation.   5. The aortic valve is tricuspid. Aortic valve regurgitation is mild.   6. Aortic dilatation noted. There is mild dilatation of the aortic root,  measuring 39 mm.   7. The inferior vena cava is normal in size with greater than 50%  respiratory variability, suggesting right atrial pressure of 3 mmHg.   ASSESSMENT AND PLAN:  1.  Persistent atrial fibrillation: Patient on Eliquis and metoprolol 100 mg twice daily.  CHA2DS2-VASc of 4.  Has had persistent weakness, fatigue, shortness of breath.  By to get back into normal rhythm.  We discussed antiarrhythmics versus ablation.  He would like to try and avoid medications and would prefer ablation.  Risk, benefits, and alternatives to EP study and radiofrequency ablation for afib were also discussed in detail today. These risks include but are not limited to stroke, bleeding, vascular damage, tamponade, perforation, damage to the esophagus, lungs, and other structures, pulmonary vein stenosis, worsening renal function, and death. The patient understands these risk and wishes to proceed.  We Juan Montes therefore proceed with catheter ablation at the next available time.  Carto, ICE, anesthesia are requested for the procedure.  Juan Montes also obtain CT PV protocol prior to the procedure to exclude LAA thrombus and further evaluate atrial anatomy.  2.  Obstructive sleep apnea: CPAP  compliance encouraged  3.  Chronic combined systolic and diastolic heart failure: Ejection fraction 30 to 35%.  Currently on Entresto-103 mg twice daily Toprol-XL 100 mg twice daily.  No obvious volume overload.  He Juan Montes potentially need an ICD in the future.  We Juan Montes discuss this further post ablation.  Case discussed with primary cardiology   Current medicines are reviewed at length with the patient today.   The patient does not have concerns regarding his medicines.  The following changes were made today:  none  Labs/ tests ordered today include:  Orders Placed This Encounter  Procedures   CT CARDIAC MORPH/PULM VEIN W/CM&W/O CA SCORE   Basic metabolic panel   CBC      Disposition:   FU with Juan Montes 3 months  Signed, Juan Harmsen Meredith Leeds, MD  04/03/2021 9:36 AM     [facility] [address] [place] [address] [phone] (office) [phone] (fax)

## 2021-04-17 ENCOUNTER — Telehealth (HOSPITAL_COMMUNITY): Payer: Self-pay | Admitting: Emergency Medicine

## 2021-04-17 NOTE — Telephone Encounter (Signed)
Reaching out to patient to offer assistance regarding upcoming cardiac imaging study; pt verbalizes understanding of appt date/time, parking situation and where to check in, pre-test NPO status and medications ordered, and verified current allergies; name and call back number provided for further questions should they arise Juan Bond RN Navigator Cardiac Imaging [facility] [phone] office [phone] cell  Daily meds, take daily metop 2 hr prior to scan

## 2021-04-18 ENCOUNTER — Other Ambulatory Visit: Payer: Self-pay

## 2021-04-18 ENCOUNTER — Ambulatory Visit (HOSPITAL_COMMUNITY)
Admission: RE | Admit: 2021-04-18 | Discharge: 2021-04-18 | Disposition: A | Discharge status: Home / Self Care | Payer: PPO | Source: Ambulatory Visit | Attending: Cardiology | Admitting: Cardiology

## 2021-04-18 DIAGNOSIS — I4819 Other persistent atrial fibrillation: Secondary | ICD-10-CM

## 2021-04-18 MED ORDER — METOPROLOL TARTRATE 5 MG/5ML IV SOLN
INTRAVENOUS | Status: AC
  Filled 2021-04-18: qty 10

## 2021-04-18 MED ORDER — IOHEXOL 350 MG/ML SOLN
100.0000 mL | Freq: Once | INTRAVENOUS | Status: AC | PRN
  Administered 2021-04-18: 95 mL via INTRAVENOUS

## 2021-04-18 MED ORDER — METOPROLOL TARTRATE 5 MG/5ML IV SOLN: 5.0000 mg | INTRAVENOUS | Status: DC | PRN

## 2021-04-23 NOTE — Anesthesia Preprocedure Evaluation (Addendum)
Anesthesia Evaluation  Patient identified by MRN, date of birth, ID band Patient awake    Reviewed: Allergy & Precautions, NPO status , Patient's Chart, lab work & pertinent test results  History of Anesthesia Complications Negative for: history of anesthetic complications  Airway Mallampati: II  TM Distance: >3 FB     Dental no notable dental hx. (+) Dental Advisory Given,    Pulmonary sleep apnea , former smoker,    Pulmonary exam normal        Cardiovascular hypertension, Pt. on home beta blockers and Pt. on medications + dysrhythmias Atrial Fibrillation  Rhythm:Irregular Rate:Normal   Stress Test 2019 The left ventricular ejection fraction is moderately decreased (30-44%). Nuclear stress EF: 43%. There was no ST segment deviation noted during stress. Defect 1: There is a small defect IMPRESSIONS   Echo 01/2021  1. Left ventricular ejection fraction, by estimation, is 30 to 35%. Left  ventricular ejection fraction by PLAX is 33 %. The left ventricle has  moderately decreased function. The left ventricle demonstrates global  hypokinesis. The left ventricular  internal cavity size was mildly dilated. Left ventricular diastolic  function could not be evaluated.  2. Right ventricular systolic function is mildly reduced. The right  ventricular size is normal. There is normal pulmonary artery systolic  pressure. The estimated right ventricular systolic pressure is 15.1 mmHg.  3. Left atrial size was severely dilated.  4. The mitral valve is abnormal. Mild mitral valve regurgitation.  5. The aortic valve is tricuspid. Aortic valve regurgitation is mild.  6. Aortic dilatation noted. There is mild dilatation of the aortic root,  measuring 39 mm.  7. The inferior vena cava is normal in size with greater than 50%  respiratory variability, suggesting right atrial pressure of 3 mmHg.   Comparison(s): Changes from prior  study are noted. 03/04/2019: LVEF 30-35%,  global HK. of moderate severity present in the basal inferior location. This is a low risk study. Findings consistent with ischemia.  LHC 2019 Lat Ramus lesion is 40% stenosed. Otherwise minimal CAD. There is mild left ventricular systolic dysfunction. The left ventricular ejection fraction is 45-50% by visual estimate.    Neuro/Psych PSYCHIATRIC DISORDERS Anxiety Depression negative neurological ROS     GI/Hepatic Neg liver ROS, GERD  ,  Endo/Other  negative endocrine ROS  Renal/GU negative Renal ROS  negative genitourinary   Musculoskeletal  (+) Arthritis ,   Abdominal   Peds  Hematology negative hematology ROS (+)   Anesthesia Other Findings   Reproductive/Obstetrics                            Anesthesia Physical  Anesthesia Plan  ASA: 3  Anesthesia Plan: General   Post-op Pain Management:    Induction: Intravenous  PONV Risk Score and Plan: 1 and Ondansetron and Midazolam  Airway Management Planned: Oral ETT  Additional Equipment:   Intra-op Plan:   Post-operative Plan: Extubation in OR  Informed Consent: I have reviewed the patients History and Physical, chart, labs and discussed the procedure including the risks, benefits and alternatives for the proposed anesthesia with the patient or authorized representative who has indicated his/her understanding and acceptance.     Dental advisory given  Plan Discussed with: Anesthesiologist, CRNA and Surgeon  Anesthesia Plan Comments:        Anesthesia Quick Evaluation

## 2021-04-24 ENCOUNTER — Ambulatory Visit (HOSPITAL_COMMUNITY)
Admission: RE | Admit: 2021-04-24 | Discharge: 2021-04-24 | Disposition: A | Discharge status: Home / Self Care | Payer: PPO | Source: Ambulatory Visit | Attending: Cardiology | Admitting: Cardiology

## 2021-04-24 ENCOUNTER — Encounter (HOSPITAL_COMMUNITY)
Admission: RE | Disposition: A | Discharge status: Home / Self Care | Payer: Self-pay | Source: Ambulatory Visit | Attending: Cardiology

## 2021-04-24 ENCOUNTER — Ambulatory Visit (HOSPITAL_COMMUNITY): Payer: PPO | Admitting: Anesthesiology

## 2021-04-24 ENCOUNTER — Ambulatory Visit (HOSPITAL_BASED_OUTPATIENT_CLINIC_OR_DEPARTMENT_OTHER)
Admission: RE | Admit: 2021-04-24 | Discharge: 2021-04-24 | Disposition: A | Discharge status: Home / Self Care | Payer: PPO | Source: Ambulatory Visit | Attending: Cardiology | Admitting: Cardiology

## 2021-04-24 ENCOUNTER — Other Ambulatory Visit: Payer: Self-pay

## 2021-04-24 ENCOUNTER — Encounter (HOSPITAL_COMMUNITY): Payer: Self-pay | Admitting: Cardiology

## 2021-04-24 DIAGNOSIS — Z79899 Other long term (current) drug therapy: Secondary | ICD-10-CM | POA: Insufficient documentation

## 2021-04-24 DIAGNOSIS — Z8249 Family history of ischemic heart disease and other diseases of the circulatory system: Secondary | ICD-10-CM | POA: Insufficient documentation

## 2021-04-24 DIAGNOSIS — G4733 Obstructive sleep apnea (adult) (pediatric): Secondary | ICD-10-CM | POA: Insufficient documentation

## 2021-04-24 DIAGNOSIS — I110 Hypertensive heart disease with heart failure: Secondary | ICD-10-CM | POA: Insufficient documentation

## 2021-04-24 DIAGNOSIS — Z7901 Long term (current) use of anticoagulants: Secondary | ICD-10-CM | POA: Insufficient documentation

## 2021-04-24 DIAGNOSIS — I700 Atherosclerosis of aorta: Secondary | ICD-10-CM | POA: Insufficient documentation

## 2021-04-24 DIAGNOSIS — I351 Nonrheumatic aortic (valve) insufficiency: Secondary | ICD-10-CM | POA: Diagnosis not present

## 2021-04-24 DIAGNOSIS — E785 Hyperlipidemia, unspecified: Secondary | ICD-10-CM | POA: Diagnosis not present

## 2021-04-24 DIAGNOSIS — I083 Combined rheumatic disorders of mitral, aortic and tricuspid valves: Secondary | ICD-10-CM | POA: Diagnosis not present

## 2021-04-24 DIAGNOSIS — I5042 Chronic combined systolic (congestive) and diastolic (congestive) heart failure: Secondary | ICD-10-CM | POA: Insufficient documentation

## 2021-04-24 DIAGNOSIS — Z888 Allergy status to other drugs, medicaments and biological substances status: Secondary | ICD-10-CM | POA: Diagnosis not present

## 2021-04-24 DIAGNOSIS — I4819 Other persistent atrial fibrillation: Secondary | ICD-10-CM | POA: Diagnosis not present

## 2021-04-24 DIAGNOSIS — I340 Nonrheumatic mitral (valve) insufficiency: Secondary | ICD-10-CM | POA: Diagnosis not present

## 2021-04-24 DIAGNOSIS — E7800 Pure hypercholesterolemia, unspecified: Secondary | ICD-10-CM | POA: Diagnosis not present

## 2021-04-24 HISTORY — PX: TEE WITHOUT CARDIOVERSION: SHX5443

## 2021-04-24 SURGERY — ECHOCARDIOGRAM, TRANSESOPHAGEAL
Anesthesia: General

## 2021-04-24 MED ORDER — LIDOCAINE HCL (CARDIAC) PF 100 MG/5ML IV SOSY
PREFILLED_SYRINGE | INTRAVENOUS | Status: DC | PRN
Start: 2021-04-24 — End: 2021-04-24
  Administered 2021-04-24: 60 mg via INTRAVENOUS

## 2021-04-24 MED ORDER — DEXAMETHASONE SODIUM PHOSPHATE 4 MG/ML IJ SOLN
INTRAMUSCULAR | Status: DC | PRN
  Administered 2021-04-24: 10 mg via INTRAVENOUS

## 2021-04-24 MED ORDER — PROPOFOL 500 MG/50ML IV EMUL
INTRAVENOUS | Status: DC | PRN
  Administered 2021-04-24: 150 ug/kg/min via INTRAVENOUS

## 2021-04-24 MED ORDER — DEXMEDETOMIDINE (PRECEDEX) IN NS 20 MCG/5ML (4 MCG/ML) IV SYRINGE
PREFILLED_SYRINGE | INTRAVENOUS | Status: DC | PRN
  Administered 2021-04-24: 8 ug via INTRAVENOUS

## 2021-04-24 MED ORDER — PROPOFOL 10 MG/ML IV BOLUS
INTRAVENOUS | Status: DC | PRN
  Administered 2021-04-24: 60 mg via INTRAVENOUS
  Administered 2021-04-24: 100 mg via INTRAVENOUS

## 2021-04-24 MED ORDER — SUCCINYLCHOLINE CHLORIDE 200 MG/10ML IV SOSY
PREFILLED_SYRINGE | INTRAVENOUS | Status: DC | PRN
  Administered 2021-04-24: 100 mg via INTRAVENOUS

## 2021-04-24 MED ORDER — ONDANSETRON HCL 4 MG/2ML IJ SOLN
INTRAMUSCULAR | Status: DC | PRN
  Administered 2021-04-24: 4 mg via INTRAVENOUS

## 2021-04-24 MED ORDER — BUTAMBEN-TETRACAINE-BENZOCAINE 2-2-14 % EX AERO
INHALATION_SPRAY | CUTANEOUS | Status: DC | PRN
  Administered 2021-04-24: 2 via TOPICAL

## 2021-04-24 MED ORDER — LACTATED RINGERS IV SOLN: INTRAVENOUS | Status: DC | PRN

## 2021-04-24 MED ORDER — PHENYLEPHRINE 40 MCG/ML (10ML) SYRINGE FOR IV PUSH (FOR BLOOD PRESSURE SUPPORT)
PREFILLED_SYRINGE | INTRAVENOUS | Status: DC | PRN
  Administered 2021-04-24: 80 ug via INTRAVENOUS

## 2021-04-24 NOTE — Addendum Note (Signed)
Addendum  created 04/24/21 1029 by Duane Boston, MD   Clinical Note Signed

## 2021-04-24 NOTE — Anesthesia Procedure Notes (Signed)
Procedure Name: Intubation Date/Time: 04/24/2021 8:13 AM Performed by: Lieutenant Diego, CRNA Pre-anesthesia Checklist: Patient identified, Emergency Drugs available, Suction available and Patient being monitored Patient Re-evaluated:Patient Re-evaluated prior to induction Oxygen Delivery Method: Circle system utilized Preoxygenation: Pre-oxygenation with 100% oxygen Induction Type: IV induction Ventilation: Mask ventilation without difficulty Laryngoscope Size: Miller and 2 Grade View: Grade I Tube type: Oral Tube size: 7.5 mm Number of attempts: 1 Airway Equipment and Method: Stylet Placement Confirmation: ETT inserted through vocal cords under direct vision, positive ETCO2 and breath sounds checked- equal and bilateral Secured at: 22 cm Tube secured with: Tape Dental Injury: Teeth and Oropharynx as per pre-operative assessment

## 2021-04-24 NOTE — Progress Notes (Signed)
    Transesophageal Echocardiogram Note  Juan Montes [id number] June 19, 1951  Procedure: Transesophageal Echocardiogram Indications: Atrial fibrillation  Procedure Details Consent: Obtained Time Out: Verified patient identification, verified procedure, site/side was marked, verified correct patient position, special equipment/implants available, Radiology Safety Procedures followed,  medications/allergies/relevent history reviewed, required imaging and test results available.  Performed  Medications:  Pt sedated (general anesthesia) by Dr Tobias Alexander.  Severe LV dysfunction (EF 25-30); severe LAE; early LAA thrombus; mild RAE and RVE; mild AI, MR and TR.  Recommend continuing anticoagulation for 4-6 weeks and then repeating study/ablation.    Complications: No apparent complications Patient did tolerate procedure well.  Kirk Ruths, MD

## 2021-04-24 NOTE — H&P (Signed)
Office Visit 04/03/2021 [facility] [address] [place], Juan Doyne, MD Cardiology Persistent atrial fibrillation [facility]) +1 more Dx Referred by Oliver Barre, PA Reason for Visit   Additional Documentation  Vitals:  BP 128/70 Pulse 80 Ht 5\' 7"  (1.702 m) Wt 88.5 kg SpO2 98% BMI 30.57 kg/m BSA 2.05 m  Flowsheets:  NEWS, MEWS Score, Anthropometrics   Encounter Info:  Billing Info, History, Allergies, Detailed Report    All Notes    Progress Notes by Constance Haw, MD at 04/03/2021 9:00 AM  Author: Constance Haw, MD Author Type: Physician Filed: 04/03/2021  9:36 AM  Note Status: Signed Cosign: Cosign Not Required Encounter Date: 04/03/2021  Editor: Constance Haw, MD (Physician)                   Electrophysiology Office Note     Date:  04/03/2021    ID:  Juan Montes, DOB August 10, 1950, MRN [id number]   PCP:  Juan Infante, MD       Cardiologist:  [name] Primary Electrophysiologist:  Will Meredith Leeds, MD         Chief Complaint: AF   History of Present Illness: Juan Montes is a 70 y.o. male who is being seen today for the evaluation of AF at the request of Fenton, Clint R, PA. Presenting today for electrophysiology evaluation.   He has a history significant for chronic systolic heart failure, hypertension, OSA, hyperlipidemia, and persistent atrial fibrillation.  He was diagnosed with atrial fibrillation by at a work event.  This was thought to be stress related.  The patient went to his PCP and was found to be in atrial fibrillation along with symptoms of fatigue and chest discomfort.  He was started on Eliquis.  He was diagnosed in 2020.  At his most recent cardiology visit, he was found to have continued fatigue and shortness of breath.   Today, he denies symptoms of palpitations, chest pain, shortness of breath, orthopnea, PND, lower extremity edema, claudication, dizziness, presyncope, syncope, bleeding, or neurologic sequela. The  patient is tolerating medications without difficulties.  He is not doing all of his daily activities due to his level of fatigue.  He does not have much in the way of shortness of breath.  His only symptom is fatigue from his atrial fibrillation.  Prior to going into atrial fibrillation, he felt well without major complaint.         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   Elevated fasting blood sugar 12/19/2009   Fatigue 12/19/2009   GERD (gastroesophageal reflux disease)     HLD (hyperlipidemia) 12/19/2009   HTN (hypertension)     Sleep apnea           Past Surgical History:  Procedure Laterality Date   HERNIA REPAIR       KNEE SURGERY Left     LEFT HEART CATH AND CORONARY ANGIOGRAPHY N/A 04/15/2018    Procedure: LEFT HEART CATH AND CORONARY ANGIOGRAPHY;  Surgeon: Juan Man, MD;  Location: [facility];  Service: Cardiovascular;  Laterality: N/A;   TONSILLECTOMY       TOTAL HIP ARTHROPLASTY Right 06/14/2020    Procedure: TOTAL HIP  ARTHROPLASTY ANTERIOR APPROACH;  Surgeon: Juan Arabian, MD;  Location: [facility];  Service: Orthopedics;  Laterality: Right;   WISDOM TOOTH EXTRACTION                  Current Outpatient Medications  Medication Sig Dispense Refill   acetaminophen (TYLENOL) 650 MG CR tablet Take 1,300 mg by mouth every 8 (eight) hours as needed for pain.        amLODipine (NORVASC) 5 MG tablet TAKE 1 TABLET(5 MG) BY MOUTH DAILY 90 tablet 3   apixaban (ELIQUIS) 5 MG TABS tablet Take 5 mg by mouth 2 (two) times daily.       buPROPion (WELLBUTRIN XL) 150 MG 24 hr tablet Take 150 mg by mouth every morning.       calcium carbonate (TUMS - DOSED IN MG ELEMENTAL CALCIUM) 500 MG chewable tablet Chew  1-2 tablets by mouth 3 (three) times daily as needed for indigestion or heartburn.        Cholecalciferol (VITAMIN D3) 2000 units TABS Take 2,000 Units by mouth daily.       ENTRESTO 97-103 MG TAKE 1 TABLET BY MOUTH TWICE DAILY 60 tablet 6   ezetimibe (ZETIA) 10 MG tablet Take 10 mg by mouth daily.       methocarbamol (ROBAXIN) 500 MG tablet Take 1 tablet (500 mg total) by mouth every 6 (six) hours as needed for muscle spasms. 40 tablet 0   metoprolol succinate (TOPROL-XL) 100 MG 24 hr tablet TAKE 1 TABLET BY MOUTH DAILY. TAKE WITH OR IMMEDIATELY FOLLOWING A MEAL. 90 tablet 1   sertraline (ZOLOFT) 100 MG tablet Take 100 mg by mouth daily.         No current facility-administered medications for this visit.      Allergies:   Atorvastatin, Celecoxib, and Pravachol [pravastatin]    Social History:  The patient  reports that he has quit smoking. His smoking use included cigars. He has never used smokeless tobacco. He reports that he does not drink alcohol and does not use drugs.    Family History:  [name] includes Heart attack (age of onset: 65) in his brother; Hypertension in his brother and father; Kidney Stones in his mother.      ROS:  Please see the history of present illness.   Otherwise, review of systems is positive for none.   All other systems are reviewed and negative.      PHYSICAL EXAM: VS:  BP 128/70   Pulse 80   Ht 5\' 7"  (1.702 m)   Wt 195 lb 3.2 oz (88.5 kg)   SpO2 98%   BMI 30.57 kg/m  , BMI Body mass index is 30.57 kg/m. GEN: Well nourished, well developed, in no acute distress  HEENT: normal  Neck: no JVD, carotid bruits, or masses Cardiac: irregular; no murmurs, rubs, or gallops,no edema  Respiratory:  clear to auscultation bilaterally, normal work of breathing GI: soft, nontender, nondistended, + BS MS: no deformity or atrophy  Skin: warm and dry Neuro:  Strength and sensation are intact Psych: euthymic mood, full affect   EKG:  EKG is  not ordered today. Personal review of the ekg ordered 03/20/21 shows atrial fibrillation, rate 77   Recent Labs: 01/31/2021: ALT 12; BUN 18; Creatinine, Ser 1.25; Hemoglobin 15.6; Magnesium 2.0; Platelets 195; Potassium 5.0; Sodium 141; TSH 2.750      Lipid Panel          Component Value Date/Time  CHOL 158 01/31/2021 0915    TRIG 99 01/31/2021 0915    HDL 55 01/31/2021 0915    CHOLHDL 2.9 01/31/2021 0915    LDLCALC 85 01/31/2021 0915           Wt Readings from Last 3 Encounters:  04/03/21 195 lb 3.2 oz (88.5 kg)  03/20/21 196 lb 3.2 oz (89 kg)  02/26/21 196 lb (88.9 kg)        Other studies Reviewed: Additional studies/ records that were reviewed today include: TTE 02/20/21  Review of the above records today demonstrates:   1. Left ventricular ejection fraction, by estimation, is 30 to 35%. Left  ventricular ejection fraction by PLAX is 33 %. The left ventricle has  moderately decreased function. The left ventricle demonstrates global  hypokinesis. The left ventricular  internal cavity size was mildly dilated. Left ventricular diastolic  function could not be evaluated.   2. Right ventricular systolic function is mildly reduced. The right  ventricular size is normal. There is normal pulmonary artery systolic  pressure. The estimated right ventricular systolic pressure is 16.6 mmHg.   3. Left atrial size was severely dilated.   4. The mitral valve is abnormal. Mild mitral valve regurgitation.   5. The aortic valve is tricuspid. Aortic valve regurgitation is mild.   6. Aortic dilatation noted. There is mild dilatation of the aortic root,  measuring 39 mm.   7. The inferior vena cava is normal in size with greater than 50%  respiratory variability, suggesting right atrial pressure of 3 mmHg.     ASSESSMENT AND PLAN:   1.  Persistent atrial fibrillation: Patient on Eliquis and metoprolol 100 mg twice daily.  CHA2DS2-VASc of 4.  Has had persistent weakness, fatigue,  shortness of breath.  By to get back into normal rhythm.  We discussed antiarrhythmics versus ablation.  He would like to try and avoid medications and would prefer ablation.   Risk, benefits, and alternatives to EP study and radiofrequency ablation for afib were also discussed in detail today. These risks include but are not limited to stroke, bleeding, vascular damage, tamponade, perforation, damage to the esophagus, lungs, and other structures, pulmonary vein stenosis, worsening renal function, and death. The patient understands these risk and wishes to proceed.  We will therefore proceed with catheter ablation at the next available time.  Carto, ICE, anesthesia are requested for the procedure.  Will also obtain CT PV protocol prior to the procedure to exclude LAA thrombus and further evaluate atrial anatomy.   2.  Obstructive sleep apnea: CPAP compliance encouraged   3.  Chronic combined systolic and diastolic heart failure: Ejection fraction 30 to 35%.  Currently on Entresto-103 mg twice daily Toprol-XL 100 mg twice daily.  No obvious volume overload.  He will potentially need an ICD in the future.  We will discuss this further post ablation.   Case discussed with primary cardiology     Current medicines are reviewed at length with the patient today.   The patient does not have concerns regarding his medicines.  The following changes were made today:  none   Labs/ tests ordered today include:     Orders Placed This Encounter  Procedures   CT CARDIAC MORPH/PULM VEIN W/CM&W/O CA SCORE   Basic metabolic panel   CBC         Disposition:   FU with Will Camnitz 3 months   Signed, Will Meredith Leeds, MD  04/03/2021 9:36 AM     [facility]  [address] [place] [address] [phone] (office) [phone] (fax)        For TEE prior to atrial fibrillation ablation; no changes. Kirk Ruths

## 2021-04-24 NOTE — Transfer of Care (Signed)
Immediate Anesthesia Transfer of Care Note  Patient: Juan Montes  Procedure(s) Performed: TRANSESOPHAGEAL ECHOCARDIOGRAM (TEE)  Patient Location: Endoscopy Unit  Anesthesia Type:General  Level of Consciousness: drowsy  Airway & Oxygen Therapy: Patient Spontanous Breathing and Patient connected to face mask oxygen  Post-op Assessment: Report given to RN and Post -op Vital signs reviewed and stable  Post vital signs: Reviewed and stable  Last Vitals:  Vitals Value Taken Time  BP 92/67 04/24/21 0843  Temp    Pulse 60 04/24/21 0847  Resp 20 04/24/21 0847  SpO2 98 % 04/24/21 0847  Vitals shown include unvalidated device data.  Last Pain:  Vitals:   04/24/21 0718  TempSrc: Oral  PainSc: 0-No pain         Complications: No notable events documented.

## 2021-04-24 NOTE — Interval H&P Note (Signed)
History and Physical Interval Note:  04/24/2021 7:32 AM  Juan Montes  has presented today for surgery, with the diagnosis of ABNORMAL CT-POSSIBLE THROMBIS.  The various methods of treatment have been discussed with the patient and family. After consideration of risks, benefits and other options for treatment, the patient has consented to  Procedure(s): TRANSESOPHAGEAL ECHOCARDIOGRAM (TEE) (N/A) as a surgical intervention.  The patient's history has been reviewed, patient examined, no change in status, stable for surgery.  I have reviewed the patient's chart and labs.  Questions were answered to the patient's satisfaction.     Kirk Ruths

## 2021-04-24 NOTE — Anesthesia Postprocedure Evaluation (Signed)
Anesthesia Post Note  Patient: Juan Montes  Procedure(s) Performed: TRANSESOPHAGEAL ECHOCARDIOGRAM (TEE)     Patient location during evaluation: Endoscopy Anesthesia Type: General Level of consciousness: sedated Pain management: pain level controlled Vital Signs Assessment: post-procedure vital signs reviewed and stable Respiratory status: spontaneous breathing and respiratory function stable Cardiovascular status: stable Postop Assessment: no apparent nausea or vomiting Anesthetic complications: no   No notable events documented.  Last Vitals:  Vitals:   04/24/21 0920 04/24/21 0927  BP: 106/77 103/72  Pulse: 71 67  Resp: 18 17  Temp:    SpO2: 96% 95%    Last Pain:  Vitals:   04/24/21 0927  TempSrc:   PainSc: 0-No pain                 [name]

## 2021-04-24 NOTE — Anesthesia Postprocedure Evaluation (Signed)
Anesthesia Post Note  Patient: Juan Montes  Procedure(s) Performed: TRANSESOPHAGEAL ECHOCARDIOGRAM (TEE)     Patient location during evaluation: Endoscopy Anesthesia Type: General Anesthetic complications: no   No notable events documented.  Last Vitals:  Vitals:   04/24/21 0920 04/24/21 0927  BP: 106/77 103/72  Pulse: 71 67  Resp: 18 17  Temp:    SpO2: 96% 95%    Last Pain:  Vitals:   04/24/21 0927  TempSrc:   PainSc: 0-No pain                 Mylen Mangan DANIEL

## 2021-04-24 NOTE — Progress Notes (Signed)
  Echocardiogram Echocardiogram Transesophageal has been performed.  Merrie Roof F 04/24/2021, 9:04 AM

## 2021-04-24 NOTE — Discharge Instructions (Addendum)

## 2021-04-25 ENCOUNTER — Telehealth: Payer: Self-pay | Admitting: Cardiology

## 2021-04-25 ENCOUNTER — Ambulatory Visit (HOSPITAL_COMMUNITY): Admission: RE | Admit: 2021-04-25 | Payer: PPO | Source: Home / Self Care | Admitting: Cardiology

## 2021-04-25 ENCOUNTER — Encounter (HOSPITAL_COMMUNITY): Admission: RE | Payer: Self-pay | Source: Home / Self Care

## 2021-04-25 ENCOUNTER — Encounter (HOSPITAL_COMMUNITY): Payer: Self-pay | Admitting: Cardiology

## 2021-04-25 SURGERY — ATRIAL FIBRILLATION ABLATION
Anesthesia: General

## 2021-04-25 NOTE — Telephone Encounter (Signed)
Patient called and wated to speak to [facility] regarding follow-up instructions after hiss TEE and cardioversion

## 2021-04-25 NOTE — Telephone Encounter (Signed)
Pt aware I will call back later today to discuss DOAC change, etc. Patient verbalized understanding and agreeable to plan.

## 2021-04-25 NOTE — Telephone Encounter (Signed)
Pt aware to stop Eliquis and start Xarelto. Pt will take last dose of Eliquis tonight, start Xarelto 20 mg once daily tomorrow. Pt made aware samples and 30 day free card left at front desk for him to pick up tomorrow. Patient verbalized understanding and agreeable to plan.    Aware I will be in touch to arrange follow up TEE in month or so. Patient verbalized understanding and agreeable to plan.

## 2021-04-26 MED ORDER — RIVAROXABAN 20 MG PO TABS: 20.0000 mg | ORAL_TABLET | Freq: Every day | ORAL | 6 refills | Status: DC

## 2021-05-08 ENCOUNTER — Encounter (HOSPITAL_BASED_OUTPATIENT_CLINIC_OR_DEPARTMENT_OTHER): Payer: Self-pay | Admitting: Cardiology

## 2021-05-08 ENCOUNTER — Ambulatory Visit (HOSPITAL_BASED_OUTPATIENT_CLINIC_OR_DEPARTMENT_OTHER): Payer: PPO | Admitting: Cardiology

## 2021-05-08 ENCOUNTER — Other Ambulatory Visit: Payer: Self-pay

## 2021-05-08 VITALS — BP 124/68 | HR 72 | Ht 67.0 in | Wt 196.8 lb

## 2021-05-08 DIAGNOSIS — E7800 Pure hypercholesterolemia, unspecified: Secondary | ICD-10-CM | POA: Diagnosis not present

## 2021-05-08 DIAGNOSIS — I10 Essential (primary) hypertension: Secondary | ICD-10-CM | POA: Diagnosis not present

## 2021-05-08 DIAGNOSIS — D6869 Other thrombophilia: Secondary | ICD-10-CM | POA: Diagnosis not present

## 2021-05-08 DIAGNOSIS — I480 Paroxysmal atrial fibrillation: Secondary | ICD-10-CM

## 2021-05-08 DIAGNOSIS — Z01818 Encounter for other preprocedural examination: Secondary | ICD-10-CM

## 2021-05-08 DIAGNOSIS — I5042 Chronic combined systolic (congestive) and diastolic (congestive) heart failure: Secondary | ICD-10-CM | POA: Diagnosis not present

## 2021-05-08 DIAGNOSIS — I428 Other cardiomyopathies: Secondary | ICD-10-CM

## 2021-05-08 DIAGNOSIS — I513 Intracardiac thrombosis, not elsewhere classified: Secondary | ICD-10-CM

## 2021-05-08 DIAGNOSIS — Z79899 Other long term (current) drug therapy: Secondary | ICD-10-CM | POA: Diagnosis not present

## 2021-05-08 DIAGNOSIS — I2510 Atherosclerotic heart disease of native coronary artery without angina pectoris: Secondary | ICD-10-CM

## 2021-05-08 DIAGNOSIS — Z01812 Encounter for preprocedural laboratory examination: Secondary | ICD-10-CM

## 2021-05-08 NOTE — H&P (View-Only) (Signed)
Cardiology Office Note:    Date:  05/08/2021   ID:  [name], DOB 09/27/1950, MRN [id number]  PCP:  Crist Infante, MD  Cardiologist:  Buford Dresser, MD PhD  Referring MD: Crist Infante, MD   CC: follow up  History of Present Illness:    Juan Montes is a 70 y.o. male with a hx of nonischemic cardiomyopathy, chronic systolic and diastolic heart failure, HTN, HLD, atrial fibrillation, prior LV thrombus with resolution (2015),  who is seen for the evaluation and management of his cardiac conditions. He was last seen by Rosaria Ferries on 05/14/18 and previously by Dr. Johnsie Cancel in 2015.  Patient concerns today:  Overall, he is feeling drowsy at the moment, but otherwise well and not in pain. In the past few days he has felt better. While sitting here he notes feeling like he is not in Afib. However, when he walks out to the car he would feel differently and become fatigued. Lately he becomes fatigued easily with activity but is generally not short of breath.  Since his retirement he has slowly gained about 20 pounds, which he attributes to a healthy appetite.  He has been forcing himself to get used to his CPAP and continues to use it regularly. On 11/11 he will follow-up with Dr. Claiborne Billings.  There have been no issues with bleeding, no hematuria or hematochezia. He denies any chest pain. No lightheadedness, headaches, syncope, orthopnea, or PND. Also has no lower extremity edema.   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   Elevated fasting blood sugar 12/19/2009   Fatigue 12/19/2009   GERD (gastroesophageal reflux disease)    HLD (hyperlipidemia) 12/19/2009   HTN (hypertension)    Sleep apnea     Past Surgical  History:  Procedure Laterality Date   HERNIA REPAIR     KNEE SURGERY Left    LEFT HEART CATH AND CORONARY ANGIOGRAPHY N/A 04/15/2018   Procedure: LEFT HEART CATH AND CORONARY ANGIOGRAPHY;  Surgeon: Leonie Man, MD;  Location: [facility];  Service: Cardiovascular;  Laterality: N/A;   TEE WITHOUT CARDIOVERSION N/A 04/24/2021   Procedure: TRANSESOPHAGEAL ECHOCARDIOGRAM (TEE);  Surgeon: Lelon Perla, MD;  Location: [facility];  Service: Cardiovascular;  Laterality: N/A;   TONSILLECTOMY     TOTAL HIP ARTHROPLASTY Right 06/14/2020   Procedure: TOTAL HIP ARTHROPLASTY ANTERIOR APPROACH;  Surgeon: Gaynelle Arabian, MD;  Location: [facility];  Service: Orthopedics;  Laterality: Right;   WISDOM TOOTH EXTRACTION      Current Medications: Current Outpatient Medications on File Prior to Visit  Medication Sig   acetaminophen (TYLENOL) 650 MG CR tablet Take 1,300 mg by mouth every 8 (eight) hours as needed for pain.    amLODipine (NORVASC) 5 MG tablet TAKE 1 TABLET(5 MG) BY MOUTH DAILY   buPROPion (WELLBUTRIN XL) 150 MG 24 hr tablet Take 150 mg by mouth every morning.   calcium carbonate (TUMS - DOSED IN MG ELEMENTAL CALCIUM) 500 MG chewable tablet Chew 1-2 tablets by mouth 3 (three) times daily as needed for indigestion or heartburn.    ENTRESTO 97-103 MG TAKE 1 TABLET BY MOUTH TWICE DAILY   ezetimibe (ZETIA) 10 MG tablet Take 10 mg by mouth daily.  methocarbamol (ROBAXIN) 500 MG tablet Take 1 tablet (500 mg total) by mouth every 6 (six) hours as needed for muscle spasms.   metoprolol succinate (TOPROL-XL) 100 MG 24 hr tablet TAKE 1 TABLET BY MOUTH DAILY. TAKE WITH OR IMMEDIATELY FOLLOWING A MEAL.   rivaroxaban (XARELTO) 20 MG TABS tablet Take 1 tablet (20 mg total) by mouth daily with supper.   sertraline (ZOLOFT) 100 MG tablet Take 100 mg by mouth daily.    No current facility-administered medications on file prior to visit.     Allergies:   Atorvastatin, Celecoxib, and Pravachol  [pravastatin]   Social History   Tobacco Use   Smoking status: Former    Types: Cigars   Smokeless tobacco: Never   Tobacco comments:    Quit about 3-4 months ago   Vaping Use   Vaping Use: Never used  Substance Use Topics   Alcohol use: No   Drug use: No      Family History: [name] includes Heart attack (age of onset: 55) in his brother; Hypertension in his brother and father; Kidney Stones in his mother. Father has severe CAD, med mgmt only option.  ROS:   Please see the history of present illness. (+) Fatigue All other systems are reviewed and negative.    EKGs/Labs/Other Studies Reviewed:    The following studies were reviewed today:  TEE 04/24/2021: 1. Severe global reduction in LV systolic function; spontaneous contrast  and probable thrombus in left atrial appendage and reduced emptying  velocity (20 cm/s). Results discussed with Dr Curt Bears.   2. Left ventricular ejection fraction, by estimation, is 20 to 25%. The  left ventricle has severely decreased function. The left ventricle  demonstrates global hypokinesis. The left ventricular internal cavity size  was mildly dilated.   3. Right ventricular systolic function is normal. The right ventricular  size is mildly enlarged.   4. Left atrial size was severely dilated. A left atrial/left atrial  appendage thrombus was detected.   5. Right atrial size was mildly dilated.   6. The mitral valve is normal in structure. Mild mitral valve  regurgitation.   7. The aortic valve is tricuspid. Aortic valve regurgitation is mild.   8. There is mild (Grade II) plaque involving the descending aorta.   Cardiac Gated CTA 04/18/2021: FINDINGS: Calcium score: 227 involving the LAD and circumflex   LM: 0   LAD 151   LCX 75.3   RCA 0   Moderate LAE mild RAE. No ASD/PFO moderate amount of LAA thrombus seen on both immediate contrast and delayed images No pericardial effusion   Normal ascending  thoracic aorta 3.6 cm Normal PV anatomy see description below   LUPV:  Ostium 12.2 mm    area 1.7 cm2   LLPV:   Ostium 21 mm   area 2.1 cm2 Elliptical   RUPV:  Ostium 17.8 mm  area 3.1 cm2   RLPV:  Ostium 20.7 mm  area 2.7 cm2   IMPRESSION: 1. Moderate amount of LAA thrombus seen on both immediate and delayed contrast images   2.  Normal PV anatomy see description above   3.  Calcium score 227 which is 60 th percentile for age/sex   4.  Normal ascending aorta 3.6 cm   5.  No pericardial effusion   6.  Moderate LAE mild RAE   7.  No ASD/PFO apparent   Consider f/u TEE after 4 week further anticoagulation after documenting compliance before proceeding with ablation  Echo 02/20/2021: 1. Left ventricular ejection fraction, by estimation, is 30 to 35%. Left  ventricular ejection fraction by PLAX is 33 %. The left ventricle has  moderately decreased function. The left ventricle demonstrates global  hypokinesis. The left ventricular  internal cavity size was mildly dilated. Left ventricular diastolic  function could not be evaluated.   2. Right ventricular systolic function is mildly reduced. The right  ventricular size is normal. There is normal pulmonary artery systolic  pressure. The estimated right ventricular systolic pressure is 09.3 mmHg.   3. Left atrial size was severely dilated.   4. The mitral valve is abnormal. Mild mitral valve regurgitation.   5. The aortic valve is tricuspid. Aortic valve regurgitation is mild.   6. Aortic dilatation noted. There is mild dilatation of the aortic root,  measuring 39 mm.   7. The inferior vena cava is normal in size with greater than 50%  respiratory variability, suggesting right atrial pressure of 3 mmHg.   Comparison(s): Changes from prior study are noted. 03/04/2019: LVEF 30-35%,  global HK.   Echo 03/04/19  1. The left ventricle has moderate-severely reduced systolic function, with an ejection fraction of 30-35%. The cavity  size was mildly dilated. Left ventricular diastolic Doppler parameters are consistent with pseudonormalization. Left ventricular  diffuse hypokinesis.  2. The right ventricle has normal systolic function. The cavity was normal.  3. Left atrial size was moderately dilated.  4. The mitral valve is abnormal. Mild thickening of the mitral valve leaflet.  5. The tricuspid valve is grossly normal.  6. The aortic valve is tricuspid. Mild thickening of the aortic valve. Aortic valve regurgitation is mild by color flow Doppler. No stenosis of the aortic valve.  7. The aorta is abnormal in size and structure.  8. There is mild dilatation of the aortic root measuring 40 mm.  9. Moderate to severe global reduction in LV systolic function; mild LVE; moderate diastolic dysfunction; moderate LAE; mildy dilated aortic root; mild AI; GLS-12.3%. Note cannot exclude apical thrombus; suggest FU limited study with definity to furter  Assess. (my comments: no thrombus seen, just not excluded. He is already on anticoagulation, so definity study not pursued)    Echo 08/11/18 - Left ventricle: The cavity size was mildly dilated. There was   mild focal basal hypertrophy of the septum. Systolic function was   moderately to severely reduced. The estimated ejection fraction   was in the range of 30% to 35%. Diffuse hypokinesis. Doppler   parameters are consistent with abnormal left ventricular   relaxation (grade 1 diastolic dysfunction). - Aortic valve: There was trivial regurgitation. - Aortic root: The aortic root was mildly dilated. - Ascending aorta: The ascending aorta was mildly dilated.   Impressions: - Moderate to severe global reduction in LV systolic function; mild   diastolic dysfunction; mild LVE; trace AI; mildly dilated aortic   root/ascending aorta.    ECHO: 03/16/2018 -The cavity size was mildly dilated. Systolic   function was mildly reduced. The estimated ejection fraction was   in the range of  45% to 50%. Diffuse hypokinesis. Doppler   parameters are consistent with abnormal left ventricular   relaxation (grade 1 diastolic dysfunction). - Aortic valve: There was trivial regurgitation. - Aorta: Aortic root dimension: 40 mm (ED). - Ascending aorta: The ascending aorta was mildly dilated. - Mitral valve: Mildly thickened leaflets . There was trivial   regurgitation. - Right ventricle: The cavity size was mildly dilated. Walkup   thickness was  normal.   CATH: 04/15/2018 Lat Ramus lesion is 40% stenosed. Otherwise minimal CAD. There is mild left ventricular systolic dysfunction. The left ventricular ejection fraction is 45-50% by visual estimate.   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  This could potentially be the reason for his symptoms, but not anginal equivalent.    EKG:  EKG is personally reviewed.   05/08/2021: atrial fibrillation at 72 bpm 03/20/2021: Atrial fibrillation with a PVC vs. Aberrant conduction. Rate 77 bpm. 03/30/2019: EKG was not ordered. 02/25/19: NSR, narrow QRS  Recent Labs: 01/31/2021: ALT 12; Magnesium 2.0; TSH 2.750 04/03/2021: BUN 14; Creatinine, Ser 1.13; Hemoglobin 15.7; Platelets 178; Potassium 4.3; Sodium 140   Recent Lipid Panel    Component Value Date/Time   CHOL 158 01/31/2021 0915   TRIG 99 01/31/2021 0915   HDL 55 01/31/2021 0915   CHOLHDL 2.9 01/31/2021 0915   LDLCALC 85 01/31/2021 0915    Physical Exam:    VS:  BP 124/68   Pulse 72   Ht 5\' 7"  (1.702 m)   Wt 196 lb 12.8 oz (89.3 kg)   SpO2 98%   BMI 30.82 kg/m     Wt Readings from Last 3 Encounters:  05/08/21 196 lb 12.8 oz (89.3 kg)  04/24/21 190 lb (86.2 kg)  04/03/21 195 lb 3.2 oz (88.5 kg)    GEN: Well nourished, well developed in no acute distress HEENT: Normal, moist mucous membranes NECK: No JVD CARDIAC: irregularly irregular rhythm, normal S1 and S2, no rubs or gallops. No murmur. VASCULAR: Radial and DP pulses 2+ bilaterally. No carotid  bruits RESPIRATORY:  Clear to auscultation without rales, wheezing or rhonchi  ABDOMEN: Soft, non-tender, non-distended MUSCULOSKELETAL:  Ambulates independently SKIN: Warm and dry, no edema NEUROLOGIC:  Alert and oriented x 3. No focal neuro deficits noted. PSYCHIATRIC:  Normal affect    ASSESSMENT:    1. Paroxysmal atrial fibrillation (HCC)   2. Pre-procedure lab exam   3. Medication management   4. NICM (nonischemic cardiomyopathy) ([place])   5. Secondary hypercoagulable state ([place])   6. Nonocclusive coronary atherosclerosis of native coronary artery   7. Chronic combined systolic and diastolic heart failure ([place])   8. Essential hypertension   9. Pure hypercholesterolemia   10. Thrombus of left atrial appendage      PLAN:    Chronic combined systolic and diastolic heart failure: due to nonischemic cardiomyopathy -NYHA class II,  -continue metoprolol succinate 100 mg daily -continue entresto max dose 97-103 BID -counseled on red flag warning signs that need immediate medical attention -we discussed SGLT2i today. He is interested but would like to wait -heart failure education reviewed, encouraged daily weights  Hypertension:  -continue metoprolol, entresto as above -continue amlodipine  History of atrial fibrillation: now persistent  LAA thrombus on TEE 04/24/21 -CHA2DS2/VAS Stroke Risk Points=4  -we discussed his LAA thrombus and afib. Changed from apixaban to rivaroxaban given this. Recheck TEE. Would like to get back into sinus rhythm, but with severe dilation of LA unlikely to hold without antiarrhythmic. Will clear appendage and rest of heart and then let Dr. Curt Bears reschedule ablation.    OSA: using CPAP, encouraged this  Nonobstructive CAD Hypercholesterolemia, LDL goal <70 -last LDL 85, on ezetimibe. Declines statins, as he has had adverse events on atorvastatin and pravastatin  Prevention: -recommend heart healthy/Mediterranean diet, with whole grains, fruits,  vegetable, fish, lean meats, nuts, and olive oil. Limit salt. -recommend moderate walking, 3-5 times/week for 30-50 minutes each session. Aim for  at least 150 minutes.week. Goal should be pace of 3 miles/hours, or walking 1.5 miles in 30 minutes -recommend avoidance of tobacco products. Avoid excess alcohol.  Plan for follow up: 2 months or sooner as needed.  Buford Dresser, MD, PhD, [facility]    Medication Adjustments/Labs and Tests Ordered: Current medicines are reviewed at length with the patient today.  Concerns regarding medicines are outlined above.   No orders of the defined types were placed in this encounter.   No orders of the defined types were placed in this encounter.  There are no Patient Instructions on file for this visit.   I,Mathew Stumpf,acting as a Education administrator for [employer], MD.,have documented all relevant documentation on the behalf of Buford Dresser, MD,as directed by  Buford Dresser, MD while in the presence of Buford Dresser, MD.  I, Buford Dresser, MD, have reviewed all documentation for this visit. The documentation on 05/08/21 for the exam, diagnosis, procedures, and orders are all accurate and complete.   Signed, Buford Dresser, MD PhD 05/08/2021   [place]

## 2021-05-08 NOTE — Progress Notes (Addendum)
Cardiology Office Note:    Date:  05/08/2021   ID:  Juan Montes, DOB Apr 04, 1951, MRN [id number]  PCP:  Crist Infante, MD  Cardiologist:  Buford Dresser, MD PhD  Referring MD: Crist Infante, MD   CC: follow up  History of Present Illness:    Juan Montes is a 70 y.o. male with a hx of nonischemic cardiomyopathy, chronic systolic and diastolic heart failure, HTN, HLD, atrial fibrillation, prior LV thrombus with resolution (2015),  who is seen for the evaluation and management of his cardiac conditions. He was last seen by Rosaria Ferries on 05/14/18 and previously by Dr. Johnsie Cancel in 2015.  Patient concerns today:  Overall, he is feeling drowsy at the moment, but otherwise well and not in pain. In the past few days he has felt better. While sitting here he notes feeling like he is not in Afib. However, when he walks out to the car he would feel differently and become fatigued. Lately he becomes fatigued easily with activity but is generally not short of breath.  Since his retirement he has slowly gained about 20 pounds, which he attributes to a healthy appetite.  He has been forcing himself to get used to his CPAP and continues to use it regularly. On 11/11 he will follow-up with Dr. Claiborne Billings.  There have been no issues with bleeding, no hematuria or hematochezia. He denies any chest pain. No lightheadedness, headaches, syncope, orthopnea, or PND. Also has no lower extremity edema.   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   Elevated fasting blood sugar 12/19/2009   Fatigue 12/19/2009   GERD (gastroesophageal reflux disease)    HLD (hyperlipidemia) 12/19/2009   HTN (hypertension)    Sleep apnea     Past Surgical  History:  Procedure Laterality Date   HERNIA REPAIR     KNEE SURGERY Left    LEFT HEART CATH AND CORONARY ANGIOGRAPHY N/A 04/15/2018   Procedure: LEFT HEART CATH AND CORONARY ANGIOGRAPHY;  Surgeon: Leonie Man, MD;  Location: [facility];  Service: Cardiovascular;  Laterality: N/A;   TEE WITHOUT CARDIOVERSION N/A 04/24/2021   Procedure: TRANSESOPHAGEAL ECHOCARDIOGRAM (TEE);  Surgeon: Lelon Perla, MD;  Location: [facility];  Service: Cardiovascular;  Laterality: N/A;   TONSILLECTOMY     TOTAL HIP ARTHROPLASTY Right 06/14/2020   Procedure: TOTAL HIP ARTHROPLASTY ANTERIOR APPROACH;  Surgeon: Gaynelle Arabian, MD;  Location: [facility];  Service: Orthopedics;  Laterality: Right;   WISDOM TOOTH EXTRACTION      Current Medications: Current Outpatient Medications on File Prior to Visit  Medication Sig   acetaminophen (TYLENOL) 650 MG CR tablet Take 1,300 mg by mouth every 8 (eight) hours as needed for pain.    amLODipine (NORVASC) 5 MG tablet TAKE 1 TABLET(5 MG) BY MOUTH DAILY   buPROPion (WELLBUTRIN XL) 150 MG 24 hr tablet Take 150 mg by mouth every morning.   calcium carbonate (TUMS - DOSED IN MG ELEMENTAL CALCIUM) 500 MG chewable tablet Chew 1-2 tablets by mouth 3 (three) times daily as needed for indigestion or heartburn.    ENTRESTO 97-103 MG TAKE 1 TABLET BY MOUTH TWICE DAILY   ezetimibe (ZETIA) 10 MG tablet Take 10 mg by mouth daily.  methocarbamol (ROBAXIN) 500 MG tablet Take 1 tablet (500 mg total) by mouth every 6 (six) hours as needed for muscle spasms.   metoprolol succinate (TOPROL-XL) 100 MG 24 hr tablet TAKE 1 TABLET BY MOUTH DAILY. TAKE WITH OR IMMEDIATELY FOLLOWING A MEAL.   rivaroxaban (XARELTO) 20 MG TABS tablet Take 1 tablet (20 mg total) by mouth daily with supper.   sertraline (ZOLOFT) 100 MG tablet Take 100 mg by mouth daily.    No current facility-administered medications on file prior to visit.     Allergies:   Atorvastatin, Celecoxib, and Pravachol  [pravastatin]   Social History   Tobacco Use   Smoking status: Former    Types: Cigars   Smokeless tobacco: Never   Tobacco comments:    Quit about 3-4 months ago   Vaping Use   Vaping Use: Never used  Substance Use Topics   Alcohol use: No   Drug use: No      Family History: [name] includes Heart attack (age of onset: 8) in his brother; Hypertension in his brother and father; Kidney Stones in his mother. Father has severe CAD, med mgmt only option.  ROS:   Please see the history of present illness. (+) Fatigue All other systems are reviewed and negative.    EKGs/Labs/Other Studies Reviewed:    The following studies were reviewed today:  TEE 04/24/2021: 1. Severe global reduction in LV systolic function; spontaneous contrast  and probable thrombus in left atrial appendage and reduced emptying  velocity (20 cm/s). Results discussed with Dr Curt Bears.   2. Left ventricular ejection fraction, by estimation, is 20 to 25%. The  left ventricle has severely decreased function. The left ventricle  demonstrates global hypokinesis. The left ventricular internal cavity size  was mildly dilated.   3. Right ventricular systolic function is normal. The right ventricular  size is mildly enlarged.   4. Left atrial size was severely dilated. A left atrial/left atrial  appendage thrombus was detected.   5. Right atrial size was mildly dilated.   6. The mitral valve is normal in structure. Mild mitral valve  regurgitation.   7. The aortic valve is tricuspid. Aortic valve regurgitation is mild.   8. There is mild (Grade II) plaque involving the descending aorta.   Cardiac Gated CTA 04/18/2021: FINDINGS: Calcium score: 227 involving the LAD and circumflex   LM: 0   LAD 151   LCX 75.3   RCA 0   Moderate LAE mild RAE. No ASD/PFO moderate amount of LAA thrombus seen on both immediate contrast and delayed images No pericardial effusion   Normal ascending  thoracic aorta 3.6 cm Normal PV anatomy see description below   LUPV:  Ostium 12.2 mm    area 1.7 cm2   LLPV:   Ostium 21 mm   area 2.1 cm2 Elliptical   RUPV:  Ostium 17.8 mm  area 3.1 cm2   RLPV:  Ostium 20.7 mm  area 2.7 cm2   IMPRESSION: 1. Moderate amount of LAA thrombus seen on both immediate and delayed contrast images   2.  Normal PV anatomy see description above   3.  Calcium score 227 which is 60 th percentile for age/sex   4.  Normal ascending aorta 3.6 cm   5.  No pericardial effusion   6.  Moderate LAE mild RAE   7.  No ASD/PFO apparent   Consider f/u TEE after 4 week further anticoagulation after documenting compliance before proceeding with ablation  Echo 02/20/2021: 1. Left ventricular ejection fraction, by estimation, is 30 to 35%. Left  ventricular ejection fraction by PLAX is 33 %. The left ventricle has  moderately decreased function. The left ventricle demonstrates global  hypokinesis. The left ventricular  internal cavity size was mildly dilated. Left ventricular diastolic  function could not be evaluated.   2. Right ventricular systolic function is mildly reduced. The right  ventricular size is normal. There is normal pulmonary artery systolic  pressure. The estimated right ventricular systolic pressure is 03.5 mmHg.   3. Left atrial size was severely dilated.   4. The mitral valve is abnormal. Mild mitral valve regurgitation.   5. The aortic valve is tricuspid. Aortic valve regurgitation is mild.   6. Aortic dilatation noted. There is mild dilatation of the aortic root,  measuring 39 mm.   7. The inferior vena cava is normal in size with greater than 50%  respiratory variability, suggesting right atrial pressure of 3 mmHg.   Comparison(s): Changes from prior study are noted. 03/04/2019: LVEF 30-35%,  global HK.   Echo 03/04/19  1. The left ventricle has moderate-severely reduced systolic function, with an ejection fraction of 30-35%. The cavity  size was mildly dilated. Left ventricular diastolic Doppler parameters are consistent with pseudonormalization. Left ventricular  diffuse hypokinesis.  2. The right ventricle has normal systolic function. The cavity was normal.  3. Left atrial size was moderately dilated.  4. The mitral valve is abnormal. Mild thickening of the mitral valve leaflet.  5. The tricuspid valve is grossly normal.  6. The aortic valve is tricuspid. Mild thickening of the aortic valve. Aortic valve regurgitation is mild by color flow Doppler. No stenosis of the aortic valve.  7. The aorta is abnormal in size and structure.  8. There is mild dilatation of the aortic root measuring 40 mm.  9. Moderate to severe global reduction in LV systolic function; mild LVE; moderate diastolic dysfunction; moderate LAE; mildy dilated aortic root; mild AI; GLS-12.3%. Note cannot exclude apical thrombus; suggest FU limited study with definity to furter  Assess. (my comments: no thrombus seen, just not excluded. He is already on anticoagulation, so definity study not pursued)    Echo 08/11/18 - Left ventricle: The cavity size was mildly dilated. There was   mild focal basal hypertrophy of the septum. Systolic function was   moderately to severely reduced. The estimated ejection fraction   was in the range of 30% to 35%. Diffuse hypokinesis. Doppler   parameters are consistent with abnormal left ventricular   relaxation (grade 1 diastolic dysfunction). - Aortic valve: There was trivial regurgitation. - Aortic root: The aortic root was mildly dilated. - Ascending aorta: The ascending aorta was mildly dilated.   Impressions: - Moderate to severe global reduction in LV systolic function; mild   diastolic dysfunction; mild LVE; trace AI; mildly dilated aortic   root/ascending aorta.    ECHO: 03/16/2018 -The cavity size was mildly dilated. Systolic   function was mildly reduced. The estimated ejection fraction was   in the range of  45% to 50%. Diffuse hypokinesis. Doppler   parameters are consistent with abnormal left ventricular   relaxation (grade 1 diastolic dysfunction). - Aortic valve: There was trivial regurgitation. - Aorta: Aortic root dimension: 40 mm (ED). - Ascending aorta: The ascending aorta was mildly dilated. - Mitral valve: Mildly thickened leaflets . There was trivial   regurgitation. - Right ventricle: The cavity size was mildly dilated. Schappert   thickness was  normal.   CATH: 04/15/2018 Lat Ramus lesion is 40% stenosed. Otherwise minimal CAD. There is mild left ventricular systolic dysfunction. The left ventricular ejection fraction is 45-50% by visual estimate.   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  This could potentially be the reason for his symptoms, but not anginal equivalent.    EKG:  EKG is personally reviewed.   05/08/2021: atrial fibrillation at 72 bpm 03/20/2021: Atrial fibrillation with a PVC vs. Aberrant conduction. Rate 77 bpm. 03/30/2019: EKG was not ordered. 02/25/19: NSR, narrow QRS  Recent Labs: 01/31/2021: ALT 12; Magnesium 2.0; TSH 2.750 04/03/2021: BUN 14; Creatinine, Ser 1.13; Hemoglobin 15.7; Platelets 178; Potassium 4.3; Sodium 140   Recent Lipid Panel    Component Value Date/Time   CHOL 158 01/31/2021 0915   TRIG 99 01/31/2021 0915   HDL 55 01/31/2021 0915   CHOLHDL 2.9 01/31/2021 0915   LDLCALC 85 01/31/2021 0915    Physical Exam:    VS:  BP 124/68   Pulse 72   Ht 5\' 7"  (1.702 m)   Wt 196 lb 12.8 oz (89.3 kg)   SpO2 98%   BMI 30.82 kg/m     Wt Readings from Last 3 Encounters:  05/08/21 196 lb 12.8 oz (89.3 kg)  04/24/21 190 lb (86.2 kg)  04/03/21 195 lb 3.2 oz (88.5 kg)    GEN: Well nourished, well developed in no acute distress HEENT: Normal, moist mucous membranes NECK: No JVD CARDIAC: irregularly irregular rhythm, normal S1 and S2, no rubs or gallops. No murmur. VASCULAR: Radial and DP pulses 2+ bilaterally. No carotid  bruits RESPIRATORY:  Clear to auscultation without rales, wheezing or rhonchi  ABDOMEN: Soft, non-tender, non-distended MUSCULOSKELETAL:  Ambulates independently SKIN: Warm and dry, no edema NEUROLOGIC:  Alert and oriented x 3. No focal neuro deficits noted. PSYCHIATRIC:  Normal affect    ASSESSMENT:    1. Paroxysmal atrial fibrillation (HCC)   2. Pre-procedure lab exam   3. Medication management   4. NICM (nonischemic cardiomyopathy) ([place])   5. Secondary hypercoagulable state ([place])   6. Nonocclusive coronary atherosclerosis of native coronary artery   7. Chronic combined systolic and diastolic heart failure ([place])   8. Essential hypertension   9. Pure hypercholesterolemia   10. Thrombus of left atrial appendage      PLAN:    Chronic combined systolic and diastolic heart failure: due to nonischemic cardiomyopathy -NYHA class II,  -continue metoprolol succinate 100 mg daily -continue entresto max dose 97-103 BID -counseled on red flag warning signs that need immediate medical attention -we discussed SGLT2i today. He is interested but would like to wait -heart failure education reviewed, encouraged daily weights  Hypertension:  -continue metoprolol, entresto as above -continue amlodipine  History of atrial fibrillation: now persistent  LAA thrombus on TEE 04/24/21 -CHA2DS2/VAS Stroke Risk Points=4  -we discussed his LAA thrombus and afib. Changed from apixaban to rivaroxaban given this. Recheck TEE. Would like to get back into sinus rhythm, but with severe dilation of LA unlikely to hold without antiarrhythmic. Will clear appendage and rest of heart and then let Dr. Curt Bears reschedule ablation.    OSA: using CPAP, encouraged this  Nonobstructive CAD Hypercholesterolemia, LDL goal <70 -last LDL 85, on ezetimibe. Declines statins, as he has had adverse events on atorvastatin and pravastatin  Prevention: -recommend heart healthy/Mediterranean diet, with whole grains, fruits,  vegetable, fish, lean meats, nuts, and olive oil. Limit salt. -recommend moderate walking, 3-5 times/week for 30-50 minutes each session. Aim for  at least 150 minutes.week. Goal should be pace of 3 miles/hours, or walking 1.5 miles in 30 minutes -recommend avoidance of tobacco products. Avoid excess alcohol.  Plan for follow up: 2 months or sooner as needed.  Buford Dresser, MD, PhD, [facility]    Medication Adjustments/Labs and Tests Ordered: Current medicines are reviewed at length with the patient today.  Concerns regarding medicines are outlined above.   No orders of the defined types were placed in this encounter.   No orders of the defined types were placed in this encounter.  There are no Patient Instructions on file for this visit.   I,Mathew Stumpf,acting as a Education administrator for [employer], MD.,have documented all relevant documentation on the behalf of Buford Dresser, MD,as directed by  Buford Dresser, MD while in the presence of Buford Dresser, MD.  I, Buford Dresser, MD, have reviewed all documentation for this visit. The documentation on 05/08/21 for the exam, diagnosis, procedures, and orders are all accurate and complete.   Signed, Buford Dresser, MD PhD 05/08/2021   [place]

## 2021-05-08 NOTE — Patient Instructions (Signed)
Medication Instructions:  Your Physician recommend you continue on your current medication as directed.    *If you need a refill on your cardiac medications before your next appointment, please call your pharmacy*   Lab Work: Your provider has recommended lab work 1 week prior to procedure (CBC, BMP). Please have this collected at [facility] at [place]. The lab is open 8:00 am - 4:30 pm. Please avoid 12:00p - 1:00p for lunch hour. You do not need an appointment. Please go to [address] [place], [place] [address]. This is in the Primary Care office on the 3rd floor, let them know you are there for blood work and they will direct you to the lab.   If you have labs (blood work) drawn today and your tests are completely normal, you will receive your results only by: [place] (if you have MyChart) OR A paper copy in the mail If you have any lab test that is abnormal or we need to change your treatment, we will call you to review the results.   Testing/Procedures: Your physician has requested that you have a TEE. During a TEE, sound waves are used to create images of your heart. It provides your doctor with information about the size and shape of your heart and how well your heart's chambers and valves are working. In this test, a transducer is attached to the end of a flexible tube that's guided down your throat and into your esophagus (the tube leading from you mouth to your stomach) to get a more detailed image of your heart. You are not awake for the procedure. Please see the instruction sheet given to you today. For further information please visit [email or web address]. [facility]     Follow-Up: At [facility], you and your health needs are our priority.  As part of our continuing mission to provide you with exceptional heart care, we have created designated Provider Care Teams.  These Care Teams include your primary Cardiologist (physician) and  Advanced Practice Providers (APPs -  Physician Assistants and Nurse Practitioners) who all work together to provide you with the care you need, when you need it.  We recommend signing up for the patient portal called "MyChart".  Sign up information is provided on this After Visit Summary.  MyChart is used to connect with patients for Virtual Visits (Telemedicine).  Patients are able to view lab/test results, encounter notes, upcoming appointments, etc.  Non-urgent messages can be sent to your provider as well.   To learn more about what you can do with MyChart, go to [email or web address].    Your next appointment:   2 month(s)  The format for your next appointment:   In Person  Provider:   Buford Dresser, MD   You are scheduled for a TEE  on Monday 06/04/21 with Dr. Margaretann Loveless.  Please arrive at the [facility] (Main Entrance A) at [facility]: [address] [place], [place] [address] at 8 am  DIET: Nothing to eat or drink after midnight except a sip of water with medications (see medication instructions below)  FYI: For your safety, and to allow [place] to monitor your vital signs accurately during the surgery/procedure we request that   if you have artificial nails, gel coating, SNS etc. Please have those removed prior to your surgery/procedure. Not having the nail coverings /polish removed may result in cancellation or delay of your surgery/procedure.   Medication Instructions:   Continue your anticoagulant: Xarelto You  will need to continue your anticoagulant after your procedure until you  are told by your  Provider that it is safe to stop   Labs: 1 week prior to procedure (BMP, CBC)   You must have a responsible person to drive you home and stay in the waiting area during your procedure. Failure to do so could result in cancellation.  Bring your insurance cards.  *Special Note: Every effort is made to have your procedure done on time. Occasionally there are  emergencies that occur at the hospital that may cause delays. Please be patient if a delay does occur.

## 2021-05-24 ENCOUNTER — Encounter (HOSPITAL_COMMUNITY): Payer: Self-pay | Admitting: Internal Medicine

## 2021-05-28 DIAGNOSIS — E785 Hyperlipidemia, unspecified: Secondary | ICD-10-CM | POA: Diagnosis not present

## 2021-05-28 DIAGNOSIS — I10 Essential (primary) hypertension: Secondary | ICD-10-CM | POA: Diagnosis not present

## 2021-05-28 DIAGNOSIS — Z01812 Encounter for preprocedural laboratory examination: Secondary | ICD-10-CM | POA: Diagnosis not present

## 2021-05-28 DIAGNOSIS — Z79899 Other long term (current) drug therapy: Secondary | ICD-10-CM | POA: Diagnosis not present

## 2021-05-28 DIAGNOSIS — N1831 Chronic kidney disease, stage 3a: Secondary | ICD-10-CM | POA: Diagnosis not present

## 2021-05-28 DIAGNOSIS — I2510 Atherosclerotic heart disease of native coronary artery without angina pectoris: Secondary | ICD-10-CM | POA: Diagnosis not present

## 2021-05-28 LAB — BASIC METABOLIC PANEL
BUN/Creatinine Ratio: 15 (ref 10–24)
BUN: 19 mg/dL (ref 8–27)
CO2: 22 mmol/L (ref 20–29)
Calcium: 9.3 mg/dL (ref 8.6–10.2)
Chloride: 103 mmol/L (ref 96–106)
Creatinine, Ser: 1.28 mg/dL — ABNORMAL HIGH (ref 0.76–1.27)
Glucose: 105 mg/dL — ABNORMAL HIGH (ref 70–99)
Potassium: 4.2 mmol/L (ref 3.5–5.2)
Sodium: 137 mmol/L (ref 134–144)
eGFR: 61 mL/min/{1.73_m2} (ref >59)

## 2021-05-28 LAB — CBC
Hematocrit: 46.2 % (ref 37.5–51.0)
Hemoglobin: 15.7 g/dL (ref 13.0–17.7)
MCH: 31.6 pg (ref 26.6–33.0)
MCHC: 34 g/dL (ref 31.5–35.7)
MCV: 93 fL (ref 79–97)
Platelets: 182 10*3/uL (ref 150–450)
RBC: 4.97 x10E6/uL (ref 4.14–5.80)
RDW: 12.8 % (ref 11.6–15.4)
WBC: 5 10*3/uL (ref 3.4–10.8)

## 2021-05-29 ENCOUNTER — Telehealth: Payer: Self-pay | Admitting: Cardiology

## 2021-05-29 MED ORDER — ENTRESTO 97-103 MG PO TABS: 1.0000 | ORAL_TABLET | Freq: Two times a day (BID) | ORAL | 6 refills | Status: DC

## 2021-05-29 NOTE — Telephone Encounter (Signed)
Rx request sent to pharmacy.  

## 2021-05-29 NOTE — Telephone Encounter (Signed)
*  STAT* If patient is at the pharmacy, call can be transferred to refill team.   1. Which medications need to be refilled? (please list name of each medication and dose if known)ENTRESTO 97-103 MG  2. Which pharmacy/location (including street and city if local pharmacy) is medication to be sent to? [place], [place] - [address] AT [place]  3. Do they need a 30 day or 90 day supply? 30 ds

## 2021-06-01 ENCOUNTER — Telehealth (HOSPITAL_BASED_OUTPATIENT_CLINIC_OR_DEPARTMENT_OTHER): Payer: Self-pay | Admitting: Cardiology

## 2021-06-01 NOTE — Telephone Encounter (Signed)
Pt advised that he will only be having a TEE on Monday 06/04/21.

## 2021-06-01 NOTE — Telephone Encounter (Signed)
Patient wanted to know if he will be having an ablation at the same time as his TEE or if the ablation will be a separate procedure

## 2021-06-03 NOTE — Anesthesia Preprocedure Evaluation (Addendum)
Anesthesia Evaluation  Patient identified by MRN, date of birth, ID band Patient awake    Reviewed: Allergy & Precautions, NPO status , Patient's Chart, lab work & pertinent test results, reviewed documented beta blocker date and time   Airway Mallampati: II  TM Distance: >3 FB Neck ROM: Full    Dental no notable dental hx. (+) Teeth Intact, Dental Advisory Given   Pulmonary sleep apnea , former smoker,    Pulmonary exam normal breath sounds clear to auscultation       Cardiovascular hypertension, Pt. on medications and Pt. on home beta blockers + angina + CAD and +CHF  Normal cardiovascular exam+ dysrhythmias (on xarelto) Atrial Fibrillation  Rhythm:Regular Rate:Normal  TEE 2022 1. Severe global reduction in LV systolic function; spontaneous contrast  and probable thrombus in left atrial appendage and reduced emptying  velocity (20 cm/s). Results discussed with Dr Curt Bears.  2. Left ventricular ejection fraction, by estimation, is 20 to 25%. The  left ventricle has severely decreased function. The left ventricle  demonstrates global hypokinesis. The left ventricular internal cavity size  was mildly dilated.  3. Right ventricular systolic function is normal. The right ventricular  size is mildly enlarged.  4. Left atrial size was severely dilated. A left atrial/left atrial  appendage thrombus was detected.  5. Right atrial size was mildly dilated.  6. The mitral valve is normal in structure. Mild mitral valve  regurgitation.  7. The aortic valve is tricuspid. Aortic valve regurgitation is mild.  8. There is mild (Grade II) plaque involving the descending aorta  Cath 2019 Mild nonischemic cardiomyopathy with EF roughly 45%.  This could potentially be the reason for his symptoms, but not anginal equivalent.   Neuro/Psych PSYCHIATRIC DISORDERS Anxiety Depression negative neurological ROS     GI/Hepatic Neg liver ROS,  GERD  ,  Endo/Other  negative endocrine ROS  Renal/GU negative Renal ROS  negative genitourinary   Musculoskeletal  (+) Arthritis ,   Abdominal   Peds  Hematology negative hematology ROS (+)   Anesthesia Other Findings   Reproductive/Obstetrics                            Anesthesia Physical Anesthesia Plan  ASA: 4  Anesthesia Plan: MAC   Post-op Pain Management:    Induction: Intravenous  PONV Risk Score and Plan: Propofol infusion and Treatment may vary due to age or medical condition  Airway Management Planned: Natural Airway  Additional Equipment:   Intra-op Plan:   Post-operative Plan:   Informed Consent: I have reviewed the patients History and Physical, chart, labs and discussed the procedure including the risks, benefits and alternatives for the proposed anesthesia with the patient or authorized representative who has indicated his/her understanding and acceptance.     Dental advisory given  Plan Discussed with: CRNA  Anesthesia Plan Comments:         Anesthesia Quick Evaluation

## 2021-06-04 ENCOUNTER — Encounter (HOSPITAL_COMMUNITY)
Admission: RE | Disposition: A | Discharge status: Home / Self Care | Payer: Self-pay | Source: Ambulatory Visit | Attending: Internal Medicine

## 2021-06-04 ENCOUNTER — Encounter (HOSPITAL_COMMUNITY): Payer: Self-pay | Admitting: Internal Medicine

## 2021-06-04 ENCOUNTER — Ambulatory Visit: Payer: PPO | Admitting: Cardiovascular Disease

## 2021-06-04 ENCOUNTER — Ambulatory Visit (HOSPITAL_COMMUNITY): Payer: PPO | Admitting: Anesthesiology

## 2021-06-04 ENCOUNTER — Ambulatory Visit (HOSPITAL_COMMUNITY)
Admission: RE | Admit: 2021-06-04 | Discharge: 2021-06-04 | Disposition: A | Discharge status: Home / Self Care | Payer: PPO | Source: Ambulatory Visit | Attending: Internal Medicine | Admitting: Internal Medicine

## 2021-06-04 ENCOUNTER — Telehealth: Payer: Self-pay | Admitting: Cardiology

## 2021-06-04 ENCOUNTER — Ambulatory Visit (HOSPITAL_BASED_OUTPATIENT_CLINIC_OR_DEPARTMENT_OTHER): Payer: PPO

## 2021-06-04 DIAGNOSIS — I110 Hypertensive heart disease with heart failure: Secondary | ICD-10-CM | POA: Insufficient documentation

## 2021-06-04 DIAGNOSIS — I428 Other cardiomyopathies: Secondary | ICD-10-CM | POA: Diagnosis not present

## 2021-06-04 DIAGNOSIS — I2510 Atherosclerotic heart disease of native coronary artery without angina pectoris: Secondary | ICD-10-CM | POA: Diagnosis not present

## 2021-06-04 DIAGNOSIS — D6869 Other thrombophilia: Secondary | ICD-10-CM | POA: Diagnosis not present

## 2021-06-04 DIAGNOSIS — E7800 Pure hypercholesterolemia, unspecified: Secondary | ICD-10-CM | POA: Diagnosis not present

## 2021-06-04 DIAGNOSIS — Z7901 Long term (current) use of anticoagulants: Secondary | ICD-10-CM | POA: Diagnosis not present

## 2021-06-04 DIAGNOSIS — I4819 Other persistent atrial fibrillation: Secondary | ICD-10-CM | POA: Diagnosis not present

## 2021-06-04 DIAGNOSIS — I341 Nonrheumatic mitral (valve) prolapse: Secondary | ICD-10-CM | POA: Diagnosis not present

## 2021-06-04 DIAGNOSIS — I5042 Chronic combined systolic (congestive) and diastolic (congestive) heart failure: Secondary | ICD-10-CM | POA: Diagnosis not present

## 2021-06-04 DIAGNOSIS — I513 Intracardiac thrombosis, not elsewhere classified: Secondary | ICD-10-CM

## 2021-06-04 DIAGNOSIS — I080 Rheumatic disorders of both mitral and aortic valves: Secondary | ICD-10-CM | POA: Diagnosis not present

## 2021-06-04 DIAGNOSIS — I480 Paroxysmal atrial fibrillation: Secondary | ICD-10-CM | POA: Diagnosis not present

## 2021-06-04 DIAGNOSIS — I351 Nonrheumatic aortic (valve) insufficiency: Secondary | ICD-10-CM

## 2021-06-04 DIAGNOSIS — I340 Nonrheumatic mitral (valve) insufficiency: Secondary | ICD-10-CM | POA: Diagnosis not present

## 2021-06-04 DIAGNOSIS — G4733 Obstructive sleep apnea (adult) (pediatric): Secondary | ICD-10-CM | POA: Insufficient documentation

## 2021-06-04 DIAGNOSIS — I088 Other rheumatic multiple valve diseases: Secondary | ICD-10-CM | POA: Diagnosis not present

## 2021-06-04 HISTORY — PX: TEE WITHOUT CARDIOVERSION: SHX5443

## 2021-06-04 LAB — ECHO TEE
MV M vel: 4.48 m/s
MV Peak grad: 80.3 mmHg
Radius: 0.4 cm

## 2021-06-04 SURGERY — ECHOCARDIOGRAM, TRANSESOPHAGEAL
Anesthesia: Monitor Anesthesia Care

## 2021-06-04 MED ORDER — LIDOCAINE VISCOUS HCL 2 % MT SOLN
OROMUCOSAL | Status: AC
  Filled 2021-06-04: qty 15

## 2021-06-04 MED ORDER — SODIUM CHLORIDE 0.9 % IV SOLN
INTRAVENOUS | Status: AC | PRN
  Administered 2021-06-04: 500 mL via INTRAVENOUS

## 2021-06-04 MED ORDER — SODIUM CHLORIDE 0.9 % IV SOLN: INTRAVENOUS | Status: DC

## 2021-06-04 MED ORDER — PROPOFOL 10 MG/ML IV BOLUS
INTRAVENOUS | Status: DC | PRN
  Administered 2021-06-04: 10 mg via INTRAVENOUS
  Administered 2021-06-04: 20 mg via INTRAVENOUS

## 2021-06-04 MED ORDER — LIDOCAINE 2% (20 MG/ML) 5 ML SYRINGE
INTRAMUSCULAR | Status: DC | PRN
  Administered 2021-06-04: 80 mg via INTRAVENOUS

## 2021-06-04 MED ORDER — EPHEDRINE SULFATE-NACL 50-0.9 MG/10ML-% IV SOSY
PREFILLED_SYRINGE | INTRAVENOUS | Status: DC | PRN
Start: 2021-06-04 — End: 2021-06-04
  Administered 2021-06-04: 10 mg via INTRAVENOUS

## 2021-06-04 MED ORDER — PROPOFOL 500 MG/50ML IV EMUL
INTRAVENOUS | Status: DC | PRN
  Administered 2021-06-04: 125 ug/kg/min via INTRAVENOUS

## 2021-06-04 MED ORDER — LIDOCAINE VISCOUS HCL 2 % MT SOLN
OROMUCOSAL | Status: DC | PRN
  Administered 2021-06-04: 20 mL via OROMUCOSAL

## 2021-06-04 NOTE — Discharge Instructions (Signed)

## 2021-06-04 NOTE — Anesthesia Procedure Notes (Signed)
Procedure Name: MAC Date/Time: 06/04/2021 9:02 AM Performed by: Dorthea Cove, CRNA Pre-anesthesia Checklist: Patient identified, Emergency Drugs available, Suction available, Patient being monitored and Timeout performed Patient Re-evaluated:Patient Re-evaluated prior to induction Oxygen Delivery Method: Nasal cannula Preoxygenation: Pre-oxygenation with 100% oxygen Induction Type: IV induction Placement Confirmation: positive ETCO2 and CO2 detector Dental Injury: Teeth and Oropharynx as per pre-operative assessment

## 2021-06-04 NOTE — Telephone Encounter (Signed)
  Pt said he had his TEE today, when he was being discharge the nurse and doctor was busy and wanted to verified that he doesn't have any blockage

## 2021-06-04 NOTE — Progress Notes (Signed)
Echocardiogram Echocardiogram Transesophageal has been performed.  [place] Deputy Soren Pigman 06/04/2021, 9:47 AM

## 2021-06-04 NOTE — Transfer of Care (Signed)
Immediate Anesthesia Transfer of Care Note  Patient: Juan Montes  Procedure(s) Performed: TRANSESOPHAGEAL ECHOCARDIOGRAM (TEE)  Patient Location: Endoscopy Unit  Anesthesia Type:MAC  Level of Consciousness: awake, alert  and drowsy  Airway & Oxygen Therapy: Patient Spontanous Breathing and Patient connected to nasal cannula oxygen  Post-op Assessment: Report given to RN and Post -op Vital signs reviewed and stable  Post vital signs: Reviewed and stable  Last Vitals:  Vitals Value Taken Time  BP 84/62 06/04/21 0944  Temp 36.4 C 06/04/21 0938  Pulse 69 06/04/21 0945  Resp 18 06/04/21 0945  SpO2 95 % 06/04/21 0945  Vitals shown include unvalidated device data.  Last Pain:  Vitals:   06/04/21 0941  TempSrc:   PainSc: Asleep         Complications: No notable events documented.

## 2021-06-04 NOTE — Telephone Encounter (Signed)
Returned patient's call and let him know that test results have not been read yet and he will be notified when they are in.

## 2021-06-04 NOTE — CV Procedure (Signed)
INDICATIONS: LA appendage thrombus relook  PROCEDURE:   Informed consent was obtained prior to the procedure. The risks, benefits and alternatives for the procedure were discussed and the patient comprehended these risks.  Risks include, but are not limited to, cough, sore throat, vomiting, nausea, somnolence, esophageal and stomach trauma or perforation, bleeding, low blood pressure, aspiration, pneumonia, infection, trauma to the teeth and death.    After a procedural time-out, the oropharynx was anesthetized with 20% benzocaine spray.   During this procedure the patient was administered propofol per anesthesia.  The patient's heart rate, blood pressure, and oxygen saturation were monitored continuously during the procedure. The period of conscious sedation was 30 minutes, of which I was present face-to-face 100% of this time.  The transesophageal probe was inserted in the esophagus and stomach without difficulty and multiple views were obtained.  The patient was kept under observation until the patient left the procedure room.  The patient left the procedure room in stable condition.   Agitated microbubble saline contrast was not administered.  COMPLICATIONS:    There were no immediate complications.  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 No LA appendage thrombus. No PFO or ASD seen.  RECOMMENDATIONS:    Discussed with Dr. Curt Bears and Dr. [name].  OK to dismiss home when alert.  Time Spent Directly with the Patient:  60 minutes   Juan Montes 06/04/2021, 9:38 AM

## 2021-06-04 NOTE — Interval H&P Note (Signed)
History and Physical Interval Note:  06/04/2021 8:43 AM  Juan Montes  has presented today for surgery, with the diagnosis of FOLLOW UP ON LEFT ATRIAL THROMBUS.  The various methods of treatment have been discussed with the patient and family. After consideration of risks, benefits and other options for treatment, the patient has consented to  Procedure(s): TRANSESOPHAGEAL ECHOCARDIOGRAM (TEE) (N/A) as a surgical intervention.  The patient's history has been reviewed, patient examined, no change in status, stable for surgery.  I have reviewed the patient's chart and labs.  Questions were answered to the patient's satisfaction.     Elouise Munroe

## 2021-06-05 ENCOUNTER — Telehealth: Payer: Self-pay | Admitting: Cardiology

## 2021-06-05 ENCOUNTER — Encounter (HOSPITAL_COMMUNITY): Payer: Self-pay | Admitting: Internal Medicine

## 2021-06-05 NOTE — Anesthesia Postprocedure Evaluation (Signed)
Anesthesia Post Note  Patient: Juan Montes  Procedure(s) Performed: TRANSESOPHAGEAL ECHOCARDIOGRAM (TEE)     Patient location during evaluation: PACU Anesthesia Type: MAC Level of consciousness: awake and alert Pain management: pain level controlled Vital Signs Assessment: post-procedure vital signs reviewed and stable Respiratory status: spontaneous breathing, nonlabored ventilation, respiratory function stable and patient connected to nasal cannula oxygen Cardiovascular status: stable and blood pressure returned to baseline Postop Assessment: no apparent nausea or vomiting Anesthetic complications: no   No notable events documented.  Last Vitals:  Vitals:   06/04/21 0949 06/04/21 0958  BP: 94/60 105/74  Pulse: 67 65  Resp: 17 20  Temp:    SpO2: 93% 96%    Last Pain:  Vitals:   06/04/21 0958  TempSrc:   PainSc: 0-No pain                 Velma Agnes L Lianni Kanaan

## 2021-06-05 NOTE — Telephone Encounter (Signed)
Pt calling to have results interpreted didn't understand them.. please advise

## 2021-06-05 NOTE — Progress Notes (Signed)
Patient woke up with a cold today, stated his wife was sick also

## 2021-06-07 NOTE — Telephone Encounter (Signed)
Pt updated and verbalized understanding.  

## 2021-06-07 NOTE — Telephone Encounter (Signed)
Dr. Margaretann Loveless discussed with him after the procedure, but to review, no evidence of clot in the appendage (which was seen previously). Continue with current medications, including blood thinner. Dr. Curt Bears is aware and will have his office reach out to discuss rescheduling ablation.

## 2021-06-08 DIAGNOSIS — I1310 Hypertensive heart and chronic kidney disease without heart failure, with stage 1 through stage 4 chronic kidney disease, or unspecified chronic kidney disease: Secondary | ICD-10-CM | POA: Diagnosis not present

## 2021-06-08 DIAGNOSIS — I4891 Unspecified atrial fibrillation: Secondary | ICD-10-CM | POA: Diagnosis not present

## 2021-06-08 DIAGNOSIS — N1831 Chronic kidney disease, stage 3a: Secondary | ICD-10-CM | POA: Diagnosis not present

## 2021-06-08 DIAGNOSIS — J189 Pneumonia, unspecified organism: Secondary | ICD-10-CM | POA: Diagnosis not present

## 2021-06-08 DIAGNOSIS — I5020 Unspecified systolic (congestive) heart failure: Secondary | ICD-10-CM | POA: Diagnosis not present

## 2021-06-08 DIAGNOSIS — I2510 Atherosclerotic heart disease of native coronary artery without angina pectoris: Secondary | ICD-10-CM | POA: Diagnosis not present

## 2021-06-08 DIAGNOSIS — R051 Acute cough: Secondary | ICD-10-CM | POA: Diagnosis not present

## 2021-06-08 DIAGNOSIS — Z1152 Encounter for screening for COVID-19: Secondary | ICD-10-CM | POA: Diagnosis not present

## 2021-06-12 DIAGNOSIS — Z125 Encounter for screening for malignant neoplasm of prostate: Secondary | ICD-10-CM | POA: Diagnosis not present

## 2021-06-12 DIAGNOSIS — E785 Hyperlipidemia, unspecified: Secondary | ICD-10-CM | POA: Diagnosis not present

## 2021-06-12 DIAGNOSIS — I10 Essential (primary) hypertension: Secondary | ICD-10-CM | POA: Diagnosis not present

## 2021-06-12 DIAGNOSIS — R7301 Impaired fasting glucose: Secondary | ICD-10-CM | POA: Diagnosis not present

## 2021-06-14 DIAGNOSIS — I10 Essential (primary) hypertension: Secondary | ICD-10-CM | POA: Diagnosis not present

## 2021-06-14 DIAGNOSIS — I5020 Unspecified systolic (congestive) heart failure: Secondary | ICD-10-CM | POA: Diagnosis not present

## 2021-06-14 DIAGNOSIS — R82998 Other abnormal findings in urine: Secondary | ICD-10-CM | POA: Diagnosis not present

## 2021-06-14 DIAGNOSIS — R7301 Impaired fasting glucose: Secondary | ICD-10-CM | POA: Diagnosis not present

## 2021-06-14 DIAGNOSIS — Z1212 Encounter for screening for malignant neoplasm of rectum: Secondary | ICD-10-CM | POA: Diagnosis not present

## 2021-06-14 DIAGNOSIS — E785 Hyperlipidemia, unspecified: Secondary | ICD-10-CM | POA: Diagnosis not present

## 2021-06-14 DIAGNOSIS — Z23 Encounter for immunization: Secondary | ICD-10-CM | POA: Diagnosis not present

## 2021-06-14 DIAGNOSIS — F419 Anxiety disorder, unspecified: Secondary | ICD-10-CM | POA: Diagnosis not present

## 2021-06-14 DIAGNOSIS — Z0000 Encounter for general adult medical examination without abnormal findings: Secondary | ICD-10-CM | POA: Diagnosis not present

## 2021-06-14 DIAGNOSIS — Z1339 Encounter for screening examination for other mental health and behavioral disorders: Secondary | ICD-10-CM | POA: Diagnosis not present

## 2021-06-14 DIAGNOSIS — I2510 Atherosclerotic heart disease of native coronary artery without angina pectoris: Secondary | ICD-10-CM | POA: Diagnosis not present

## 2021-06-14 DIAGNOSIS — I4891 Unspecified atrial fibrillation: Secondary | ICD-10-CM | POA: Diagnosis not present

## 2021-06-14 DIAGNOSIS — Z1331 Encounter for screening for depression: Secondary | ICD-10-CM | POA: Diagnosis not present

## 2021-06-14 DIAGNOSIS — D6869 Other thrombophilia: Secondary | ICD-10-CM | POA: Diagnosis not present

## 2021-06-14 DIAGNOSIS — I428 Other cardiomyopathies: Secondary | ICD-10-CM | POA: Diagnosis not present

## 2021-06-27 DIAGNOSIS — I2510 Atherosclerotic heart disease of native coronary artery without angina pectoris: Secondary | ICD-10-CM | POA: Diagnosis not present

## 2021-06-27 DIAGNOSIS — N1831 Chronic kidney disease, stage 3a: Secondary | ICD-10-CM | POA: Diagnosis not present

## 2021-06-27 DIAGNOSIS — E785 Hyperlipidemia, unspecified: Secondary | ICD-10-CM | POA: Diagnosis not present

## 2021-06-27 DIAGNOSIS — I10 Essential (primary) hypertension: Secondary | ICD-10-CM | POA: Diagnosis not present

## 2021-06-30 ENCOUNTER — Other Ambulatory Visit: Payer: Self-pay | Admitting: Cardiology

## 2021-07-10 NOTE — Progress Notes (Signed)
Cardiology Clinic Note   Patient Name: Juan Montes Date of Encounter: 07/11/2021  Primary Care Provider:  Crist Infante, MD Primary Cardiologist:  Buford Dresser, MD  Patient Profile    70 year old male with a history of persistent atrial fibrillation on Xarelto, LAA thrombus, non-obstructive CAD, chronic combined systolic and diastolic heart failure, NICM, hypertension, hyperlipidemia c/ statin intolerance, OSA, anxiety, depression, and OA who presents for follow-up related to atrial fibrillation.   Past Medical History    Past Medical History:  Diagnosis Date   A-fib ([place]) 09/20/2013   Anxiety disorder 12/19/2009   Arthritis of knee, degenerative 12/19/2009   BP (high blood pressure) 12/19/2009   Chronic combined systolic and diastolic CHF (congestive heart failure) ([place])    a. MV/LHC 2019 EF 30-45%; b.Echo 2020 EF 30-35%, mod red LVSF, mod LAE, mild aortic root dialtion, 40 mm; c.TEE 9/22 EF 20-25%, red LVF, LAA thrombus, severe LAE, mild MR/AR, mild plaque des aorta; d. TEE 10/22 EF 30%, dec LVF, RVSF, sev LAE, mod RAE, no evidence of LAA thrombus, no aortic root dilation.   Clinical depression 12/19/2009   Coronary artery disease, non-occlusive    a. abnormal MV 2019, EF 30-43%; b. LHC 03/2018 Lat Ramus lesion is 40% stenosed. Otherwise minimal CAD, EF 45%.   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   Elevated fasting blood sugar 12/19/2009   Fatigue 12/19/2009   GERD (gastroesophageal reflux disease)    HLD (hyperlipidemia) 12/19/2009   HTN (hypertension)    Sleep apnea    Past Surgical History:  Procedure Laterality Date   HERNIA REPAIR     KNEE SURGERY Left    LEFT HEART CATH AND CORONARY ANGIOGRAPHY N/A 04/15/2018   Procedure: LEFT HEART CATH AND CORONARY ANGIOGRAPHY;  Surgeon: Leonie Man, MD;  Location: [place] [facility];  Service: Cardiovascular;  Laterality: N/A;   TEE WITHOUT  CARDIOVERSION N/A 04/24/2021   Procedure: TRANSESOPHAGEAL ECHOCARDIOGRAM (TEE);  Surgeon: Lelon Perla, MD;  Location: [facility];  Service: Cardiovascular;  Laterality: N/A;   TEE WITHOUT CARDIOVERSION N/A 06/04/2021   Procedure: TRANSESOPHAGEAL ECHOCARDIOGRAM (TEE);  Surgeon: Elouise Munroe, MD;  Location: [place];  Service: Cardiology;  Laterality: N/A;   TONSILLECTOMY     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   Celecoxib Itching and Dermatitis   Pravachol [Pravastatin]     Other reaction(s): made him feel bad.    History of Present Illness    70 year old male with the above past medical history including persistent atrial fibrillation on Xarelto, LAA thrombus, non-obstructive CAD, chronic combined systolic and diastolic heart failure, NICM, hypertension, hyperlipidemia c/ statin intolerance, OSA, anxiety, depression, and OA.  With regard to his atrial fibrillation, he was considered a poor candidate for DCCV without antiarrhythmic in the setting of severe LAE and was referred to EP. At his initial visit with Dr. Curt Bears, he stated he preferred to pursue ablation over antiarrhythmic medication. He was scheduled for an ablation in 03/2021 with Dr. Curt Bears, however, pre-op TEE showed LAA thrombus and his procedure was delayed. He was transitioned from Eliquis to Xarelto with plans to reschedule his ablation  with Dr. Curt Bears after resolution of thrombus. He last saw Dr. [name] on 05/08/2021 and reported easy fatigability in the setting of symptomatic persistent atrial fibrillation. Repeat TEE on 06/04/21 showed resolution of LAA thrombus, EF 30%, and severe LAE.   He presents today for  follow-up. Since his last visit he has done well overall from a cardiac standpoint.  He still does have some fatigability, very mild dyspnea with prolonged exertion, however he states this is somewhat improved. Has not had any communication with Dr. Macky Lower office regarding rescheduling his ablation  and is eager to have this procedure done. Otherwise, he has no concerns today.  Home Medications    Current Outpatient Medications  Medication Sig Dispense Refill   amLODipine (NORVASC) 5 MG tablet TAKE 1 TABLET(5 MG) BY MOUTH DAILY 90 tablet 3   buPROPion (WELLBUTRIN XL) 150 MG 24 hr tablet Take 150 mg by mouth every morning.     calcium carbonate (TUMS - DOSED IN MG ELEMENTAL CALCIUM) 500 MG chewable tablet Chew 1-2 tablets by mouth 3 (three) times daily as needed for indigestion or heartburn.      ezetimibe (ZETIA) 10 MG tablet Take 10 mg by mouth daily.     metoprolol succinate (TOPROL-XL) 100 MG 24 hr tablet TAKE 1 TABLET BY MOUTH DAILY WITH OR IMMEDIATELY FOLLOWING A MEAL 90 tablet 1   rivaroxaban (XARELTO) 20 MG TABS tablet Take 1 tablet (20 mg total) by mouth daily with supper. 30 tablet 6   sacubitril-valsartan (ENTRESTO) 97-103 MG Take 1 tablet by mouth 2 (two) times daily. 60 tablet 6   sertraline (ZOLOFT) 100 MG tablet Take 100 mg by mouth daily.      No current facility-administered medications for this visit.     Family History    Family History  Problem Relation Age of Onset   Kidney Stones Mother    Hypertension Father    Heart attack Brother 45       had CABG   Hypertension Brother    He indicated that his mother is alive. He indicated that his father is alive. He indicated that his brother is alive.  Social History    Social History   Socioeconomic History   Marital status: Married    Spouse name: Not on file   Number of children: Not on file   Years of education: Not on file   Highest education level: Not on file  Occupational History   Occupation: retired   Tobacco Use   Smoking status: Former    Types: Cigars   Smokeless tobacco: Never   Tobacco comments:    Quit about 3-4 months ago   Scientific laboratory technician Use: Never used  Substance and Sexual Activity   Alcohol use: No   Drug use: No   Sexual activity: Not on file  Other Topics Concern   Not on file  Social History Narrative   Not on file   Social Determinants of Health   Financial Resource Strain: Not on file  Food Insecurity: Not on file  Transportation Needs: Not on file  Physical Activity: Not on file  Stress: Not on file  Social Connections: Not on file  Intimate Partner Violence: Not on file     Review of Systems   He denies chest pain, palpitations, pnd, orthopnea, n, v, dizziness, syncope, edema, weight gain, or early satiety. All other systems reviewed and are otherwise negative except as noted above.   Physical Exam    VS:  BP (!) 122/92 (BP Location: Right Arm, Patient Position: Sitting, Cuff Size: Normal)    Pulse 88    Ht 5\' 7"  (1.702 m)    Wt 197 lb 12.8 oz (89.7 kg)    SpO2 94%    BMI 30.98 kg/m   GEN: Well nourished, well developed, in no acute distress. HEENT: normal. Neck: Supple, no JVD, carotid bruits, or masses. Cardiac: Irregularly irregular, no murmurs, rubs, or gallops. No clubbing, cyanosis, edema.  Radials/DP/PT 2+ and equal bilaterally.  Respiratory:  Respirations regular and unlabored, clear to auscultation bilaterally. GI: Soft, nontender, nondistended, BS + x 4. MS: no deformity or atrophy. Skin: warm and dry, no rash. Neuro:  Strength and sensation are intact. Psych: Normal affect.  Accessory Clinical Findings    ECG personally reviewed by me today- No EKG in office today.   Lab Results  Component Value Date   WBC 5.0 05/28/2021   HGB 15.7 05/28/2021   HCT 46.2 05/28/2021   MCV 93 05/28/2021   PLT 182 05/28/2021   Lab Results  Component Value Date   CREATININE 1.28 (H) 05/28/2021   BUN 19 05/28/2021   NA 137 05/28/2021   K  4.2 05/28/2021   CL 103 05/28/2021   CO2 22 05/28/2021   Lab Results  Component Value Date   ALT 12 01/31/2021   AST 14 01/31/2021   ALKPHOS 118 01/31/2021   BILITOT 0.6 01/31/2021   Lab Results  Component Value Date   CHOL 158 01/31/2021   HDL 55 01/31/2021   LDLCALC 85 01/31/2021   TRIG 99 01/31/2021   CHOLHDL 2.9 01/31/2021    No results found for: HGBA1C  Assessment & Plan   1. Persistent atrial fibrillation/LAA thrombus: Symptomatic with fatigability, mild DOE.  LAA thrombus on TEE 03/2021, switched from Eliquis to Xarelto, now resolved per TEE 06/04/21. Severe LAE, EF 30%. Poor candidate for DCCV without antiarrhythmic. Per shared decision making in consultation with Dr. Curt Bears, EP, her prefers ablation over antiarrhythmic therapy. Irregularly irregular HR on exam today. Will reach out to Dr. Macky Lower office to facilitate re-scheduling ablation. Continue Xarelto, Toprol-XL.   2. Chronic combined systolic and diastolic heart failure: Recent TEE as above. Euvolemic and well compensated on exam. Discussed possibility of SGLT2i at last visit with Dr. [name] and again today. He declines addition of further mediation today. Continue GDMT with Entresto, Toprol-XL. Consider repeat echo 3 months after maximum GDMT achieved and potential restoration of NSR. Encouraged daily weights.   3. Mild non-obstructive CAD/mild plaque of descending aorta: H/o abnormal MV in 2019, cath in 2019 showed mild, non-obstructive CAD (40% lat Ramus). Stable with no anginal symptoms. No indication for ischemic evaluation at this time. He is statin intolerant. No ASA in setting of chronic anticoagulation. Continue Zetia, amlodipine.   4. Hypertension: DBP elevated in office today, 122/92.  States he has had isolated elevated blood pressure readings at home in the setting of recent stress, but overall his blood pressure has been within normal limits.  Encouraged him to keep track of his blood pressures at  home report consistent readings greater than 130/80.  If blood pressure remains elevated, could consider addition of SGLT2 inhibitor as above, or increase of amlodipine dose. For now, continue current antihypertensive regimen.   5. Hyperlipidemia: Statin intolerant. Lipid panel 7/222 showed LDL 85. Continue Zetia. Encouraged continued lifestyle modifications with diet and exercise.    6. Dispostion: Follow-up with EP to schedule ablation. Follow-up with Dr. [name] in 3-4 months.  Lenna Sciara, NP 07/11/2021, 4:49 PM

## 2021-07-11 ENCOUNTER — Encounter (HOSPITAL_BASED_OUTPATIENT_CLINIC_OR_DEPARTMENT_OTHER): Payer: Self-pay | Admitting: General Practice

## 2021-07-11 ENCOUNTER — Ambulatory Visit (HOSPITAL_BASED_OUTPATIENT_CLINIC_OR_DEPARTMENT_OTHER): Payer: Self-pay | Admitting: Cardiology

## 2021-07-11 ENCOUNTER — Ambulatory Visit (HOSPITAL_BASED_OUTPATIENT_CLINIC_OR_DEPARTMENT_OTHER): Payer: PPO | Admitting: Nurse Practitioner

## 2021-07-11 ENCOUNTER — Other Ambulatory Visit: Payer: Self-pay

## 2021-07-11 VITALS — BP 122/92 | HR 88 | Ht 67.0 in | Wt 197.8 lb

## 2021-07-11 DIAGNOSIS — E782 Mixed hyperlipidemia: Secondary | ICD-10-CM | POA: Diagnosis not present

## 2021-07-11 DIAGNOSIS — I10 Essential (primary) hypertension: Secondary | ICD-10-CM

## 2021-07-11 DIAGNOSIS — I2510 Atherosclerotic heart disease of native coronary artery without angina pectoris: Secondary | ICD-10-CM

## 2021-07-11 DIAGNOSIS — I5042 Chronic combined systolic (congestive) and diastolic (congestive) heart failure: Secondary | ICD-10-CM | POA: Diagnosis not present

## 2021-07-11 DIAGNOSIS — I4819 Other persistent atrial fibrillation: Secondary | ICD-10-CM

## 2021-07-11 DIAGNOSIS — I513 Intracardiac thrombosis, not elsewhere classified: Secondary | ICD-10-CM | POA: Diagnosis not present

## 2021-07-11 NOTE — Patient Instructions (Signed)
Medication Instructions:  Your Physician recommend you continue on your current medication as directed.    *If you need a refill on your cardiac medications before your next appointment, please call your pharmacy*   Lab Work: None ordered today   Testing/Procedures: None ordered today   Follow-Up: At [facility], you and your health needs are our priority.  As part of our continuing mission to provide you with exceptional heart care, we have created designated Provider Care Teams.  These Care Teams include your primary Cardiologist (physician) and Advanced Practice Providers (APPs -  Physician Assistants and Nurse Practitioners) who all work together to provide you with the care you need, when you need it.  We recommend signing up for the patient portal called "MyChart".  Sign up information is provided on this After Visit Summary.  MyChart is used to connect with patients for Virtual Visits (Telemedicine).  Patients are able to view lab/test results, encounter notes, upcoming appointments, etc.  Non-urgent messages can be sent to your provider as well.   To learn more about what you can do with MyChart, go to [email or web address].    Your next appointment:   3-4 month(s)  The format for your next appointment:   In Person  Provider:   Buford Dresser, MD    Other Instructions Recommend weighing daily and keeping a log. Please call our office if you have weight gain of 2 pounds overnight or 5 pounds in 1 week.   Date  Time Weight                                              Tips to Measure your Blood Pressure Correctly  To determine whether you have hypertension, a medical professional will take a blood pressure reading. How you prepare for the test, the position of your arm, and other factors can change a blood pressure reading by 10% or more. That could be enough to hide high blood pressure, start you on a drug you don't really need, or lead your  doctor to incorrectly adjust your medications.  National and international guidelines offer specific instructions for measuring blood pressure. If a doctor, nurse, or medical assistant isn't doing it right, don't hesitate to ask him or her to get with the guidelines.  Here's what you can do to ensure a correct reading:  Don't drink a caffeinated beverage or smoke during the 30 minutes before the test.  Sit quietly for five minutes before the test begins.  During the measurement, sit in a chair with your feet on the floor and your arm supported so your elbow is at about heart level.  The inflatable part of the cuff should completely cover at least 80% of your upper arm, and the cuff should be placed on bare skin, not over a shirt.  Don't talk during the measurement.  Have your blood pressure measured twice, with a brief break in between. If the readings are different by 5 points or more, have it done a third time.  In 2017, new guidelines from the [employer], the [employer] of Cardiology, and nine other health organizations lowered the diagnosis of high blood pressure to 130/80 mm Hg or higher for all adults. The guidelines also redefined the various blood pressure categories to now include normal, elevated, Stage 1 hypertension, Stage 2 hypertension, and hypertensive crisis (see "Blood  pressure categories").  Blood pressure categories  Blood pressure category SYSTOLIC (upper number)  DIASTOLIC (lower number)  Normal Less than 120 mm Hg and Less than 80 mm Hg  Elevated 120-129 mm Hg and Less than 80 mm Hg  High blood pressure: Stage 1 hypertension 130-139 mm Hg or 80-89 mm Hg  High blood pressure: Stage 2 hypertension 140 mm Hg or higher or 90 mm Hg or higher  Hypertensive crisis (consult your doctor immediately) Higher than 180 mm Hg and/or Higher than 120 mm Hg  Source: American Heart Association and American Stroke Association. For more on getting your blood pressure  under control, buy Controlling Your Blood Pressure, a Special Health Report from [facility].   Blood Pressure Log   Date   Time  Blood Pressure  Position  Example: Nov 1 9 AM 124/78 sitting

## 2021-07-13 ENCOUNTER — Other Ambulatory Visit: Payer: Self-pay | Admitting: Cardiology

## 2021-07-16 ENCOUNTER — Telehealth: Payer: Self-pay | Admitting: *Deleted

## 2021-07-16 NOTE — Telephone Encounter (Signed)
Ablation date held for 09/27/21. Scheduled for OV w/ Dr. Curt Bears 08/28/21. Aware will go over procedure instructions at that viist if MD and pt decide to proceed with ablation. Patient verbalized understanding and agreeable to plan.

## 2021-07-17 DIAGNOSIS — R0981 Nasal congestion: Secondary | ICD-10-CM | POA: Diagnosis not present

## 2021-07-17 DIAGNOSIS — I5020 Unspecified systolic (congestive) heart failure: Secondary | ICD-10-CM | POA: Diagnosis not present

## 2021-07-17 DIAGNOSIS — N1831 Chronic kidney disease, stage 3a: Secondary | ICD-10-CM | POA: Diagnosis not present

## 2021-07-17 DIAGNOSIS — R051 Acute cough: Secondary | ICD-10-CM | POA: Diagnosis not present

## 2021-07-17 DIAGNOSIS — I4891 Unspecified atrial fibrillation: Secondary | ICD-10-CM | POA: Diagnosis not present

## 2021-07-17 DIAGNOSIS — Z1152 Encounter for screening for COVID-19: Secondary | ICD-10-CM | POA: Diagnosis not present

## 2021-07-17 DIAGNOSIS — I1310 Hypertensive heart and chronic kidney disease without heart failure, with stage 1 through stage 4 chronic kidney disease, or unspecified chronic kidney disease: Secondary | ICD-10-CM | POA: Diagnosis not present

## 2021-07-17 DIAGNOSIS — J069 Acute upper respiratory infection, unspecified: Secondary | ICD-10-CM | POA: Diagnosis not present

## 2021-07-17 DIAGNOSIS — J029 Acute pharyngitis, unspecified: Secondary | ICD-10-CM | POA: Diagnosis not present

## 2021-07-27 DIAGNOSIS — I2510 Atherosclerotic heart disease of native coronary artery without angina pectoris: Secondary | ICD-10-CM | POA: Diagnosis not present

## 2021-07-27 DIAGNOSIS — N1831 Chronic kidney disease, stage 3a: Secondary | ICD-10-CM | POA: Diagnosis not present

## 2021-07-27 DIAGNOSIS — E785 Hyperlipidemia, unspecified: Secondary | ICD-10-CM | POA: Diagnosis not present

## 2021-07-27 DIAGNOSIS — I10 Essential (primary) hypertension: Secondary | ICD-10-CM | POA: Diagnosis not present

## 2021-08-03 ENCOUNTER — Telehealth: Payer: Self-pay | Admitting: *Deleted

## 2021-08-03 DIAGNOSIS — Z01812 Encounter for preprocedural laboratory examination: Secondary | ICD-10-CM

## 2021-08-03 DIAGNOSIS — I480 Paroxysmal atrial fibrillation: Secondary | ICD-10-CM

## 2021-08-03 NOTE — Telephone Encounter (Signed)
Spoken to pt several times today. Moved ablation date to next week, 1/13. Pt will have CT next Tuesday, instructions sent via mychart Pt will stop by the office Monday for blood work. Pt scheduled to see Dr. Curt Bears next Wednesday for H&P and to go over procedure instructions. Patient verbalized understanding and agreeable to plan.

## 2021-08-06 ENCOUNTER — Encounter: Payer: Self-pay | Admitting: Cardiovascular Disease

## 2021-08-06 ENCOUNTER — Ambulatory Visit: Payer: PPO | Admitting: Cardiovascular Disease

## 2021-08-06 ENCOUNTER — Other Ambulatory Visit: Payer: PPO | Admitting: *Deleted

## 2021-08-06 ENCOUNTER — Other Ambulatory Visit: Payer: Self-pay

## 2021-08-06 ENCOUNTER — Telehealth (HOSPITAL_COMMUNITY): Payer: Self-pay | Admitting: Emergency Medicine

## 2021-08-06 VITALS — BP 132/88 | HR 79 | Ht 67.0 in | Wt 195.2 lb

## 2021-08-06 DIAGNOSIS — Z01812 Encounter for preprocedural laboratory examination: Secondary | ICD-10-CM | POA: Diagnosis not present

## 2021-08-06 DIAGNOSIS — G4733 Obstructive sleep apnea (adult) (pediatric): Secondary | ICD-10-CM

## 2021-08-06 DIAGNOSIS — I4819 Other persistent atrial fibrillation: Secondary | ICD-10-CM | POA: Diagnosis not present

## 2021-08-06 DIAGNOSIS — I10 Essential (primary) hypertension: Secondary | ICD-10-CM

## 2021-08-06 DIAGNOSIS — I428 Other cardiomyopathies: Secondary | ICD-10-CM

## 2021-08-06 DIAGNOSIS — I5042 Chronic combined systolic (congestive) and diastolic (congestive) heart failure: Secondary | ICD-10-CM

## 2021-08-06 DIAGNOSIS — I480 Paroxysmal atrial fibrillation: Secondary | ICD-10-CM

## 2021-08-06 DIAGNOSIS — E7800 Pure hypercholesterolemia, unspecified: Secondary | ICD-10-CM

## 2021-08-06 LAB — BASIC METABOLIC PANEL
BUN/Creatinine Ratio: 15 (ref 10–24)
BUN: 19 mg/dL (ref 8–27)
CO2: 28 mmol/L (ref 20–29)
Calcium: 9.7 mg/dL (ref 8.6–10.2)
Chloride: 103 mmol/L (ref 96–106)
Creatinine, Ser: 1.27 mg/dL (ref 0.76–1.27)
Glucose: 112 mg/dL — ABNORMAL HIGH (ref 70–99)
Potassium: 4.4 mmol/L (ref 3.5–5.2)
Sodium: 140 mmol/L (ref 134–144)
eGFR: 61 mL/min/{1.73_m2} (ref >59)

## 2021-08-06 LAB — CBC
Hematocrit: 48.7 % (ref 37.5–51.0)
Hemoglobin: 16.5 g/dL (ref 13.0–17.7)
MCH: 31.8 pg (ref 26.6–33.0)
MCHC: 33.9 g/dL (ref 31.5–35.7)
MCV: 94 fL (ref 79–97)
Platelets: 195 10*3/uL (ref 150–450)
RBC: 5.19 x10E6/uL (ref 4.14–5.80)
RDW: 14.5 % (ref 11.6–15.4)
WBC: 5.7 10*3/uL (ref 3.4–10.8)

## 2021-08-06 NOTE — Addendum Note (Signed)
Addended by: Stanton Kidney on: 08/06/2021 08:40 AM   Modules accepted: Orders

## 2021-08-06 NOTE — Progress Notes (Signed)
Cardiology Office Note    Date:  08/07/2021   ID:  Juan Montes, DOB 1951-07-15, MRN [id number]  PCP:  Juan Infante, MD  Cardiologist:  Juan Majestic, MD (sleep); Dr. Buford Montes  78-monthfollow-up sleep evaluation   History of Present Illness:  Juan Montes a 71y.o. Montes who is followed by Juan Montes primary cardiology care with Dr. MCrist Montes his primary provider.  I saw him for my initial sleep evaluation on February 05, 2021.  Juan Montes a history of paroxysmal atrial fibrillation, essential hypertension, combined systolic and diastolic heart failure with a nonischemic cardiomyopathy, as well as history of prior LV thrombus with resolution and hyperlipidemia.  An echo Doppler study in August 2020 showed an EF of 30 to 35% with grade 2 diastolic dysfunction.  His left atrium was moderately dilated.  There was aortic sclerosis without stenosis.  His aortic root measured 40 mm.  He had abnormal LV strain.  The patient has been followed in atrial fibrillation clinic and was last evaluated in April 2022 when he was found to be back in atrial fibrillation.  He has a history of obstructive sleep apnea which was originally diagnosed in September 2020.  A split-night protocol confirmed severe sleep apnea with an AHI of 41.3 and RDI of 62.7 on the diagnostic portion of the study.  Events were very severe during REM sleep with an AHI of 70.6.  He met split-night criteria and CPAP was initiated at 5 cm and titrated to 13 cm.  At 13 cm, AHI was still increased to 8.4 and RDI 11.3 with O2 nadir at 92%.  Due to initiation of the COVID pandemic, unfortunately he never was seen in a sleep clinic evaluation following CPAP initiation.  Apparently, he has not been reliable with CPAP use and admits to significant fatigability as well as daytime sleepiness, snoring, and nocturia.  I calculated a Epworth Sleepiness Scale score today which endorsed at 10 and is consistent with  excessive daytime sleepiness.    Epworth Sleepiness Scale: Situation   Chance of Dozing/Sleeping (0 = never , 1 = slight chance , 2 = moderate chance , 3 = high chance )   sitting and reading 1   watching TV 1   sitting inactive in a public place 1   being a passenger in a motor vehicle for an hour or more 1   lying down in the afternoon 3   sitting and talking to someone 0   sitting quietly after lunch (no alcohol) 3   while stopped for a few minutes in traffic as the driver 0   Total Score  10    Typically he goes to bed between 10 PM and midnight and wakes up at 8:00.  He has nocturia at least 2 times per night.  He is unaware of bruxism, restless legs, hypnagogic hallucinations, or cataplectic events.  He previously had exercise but essentially stopped exercising recently.  He had undergone hip replacement surgery November 2021 with Dr. ASandie Montes  He states he had COVID around Christmas 2021.    During his initial evaluation with me on January 30, 2021, he had stopped using CPAP.  He apparently only used it for several months and fortunately initially met compliance and therefore still has his machine.  During this evaluation I had a very lengthy discussion concerning adverse consequences of untreated sleep apnea particularly with reference to his cardiovascular health.  He was potentially available  for sleeping 8 to 10 hours per night.  I changed his settings and reduce his ramp time and increase start pressure to 7 cm.  I changed his mode to an auto therapy to start at 10 cm with potential titration up to 20 cm with needed.  I also recommended he undergo a follow-up echo Doppler study for reassessment of LV function so that this data will be available to Juan Montes when she sees him following that visit.  At the time he was on guideline directed medical therapy with Entresto 97/103 mg twice a day, metoprolol succinate 100 mg daily, in addition to him Klonopin 5 mg.  He was in atrial  fibrillation with ventricular rate in the 80s.  He has continued to have atrial fibrillation and has seen Juan Montes in follow-up in October 2022 and has also been evaluated by Dr. Curt Montes.  He tells me he is scheduled to undergo atrial fibrillation ablation later this week on August 10, 2020.  Since he has been on CPAP he has noticed resolution of prior snoring, his nocturia has improved from 3 times per night to 0 or very rarely 1 time per night.  Epworth Sleepiness Scale score improved to 8.  He presents for reevaluation.   Past Medical History:  Diagnosis Date   A-fib ([place]) 09/20/2013   Anxiety disorder 12/19/2009   Arthritis of knee, degenerative 12/19/2009   BP (high blood pressure) 12/19/2009   Chronic combined systolic and diastolic CHF (congestive heart failure) ([place])    a. MV/LHC 2019 EF 30-45%; b.Echo 2020 EF 30-35%, mod red LVSF, mod LAE, mild aortic root dialtion, 40 mm; c.TEE 9/22 EF 20-25%, red LVF, LAA thrombus, severe LAE, mild MR/AR, mild plaque des aorta; d. TEE 10/22 EF 30%, dec LVF, RVSF, sev LAE, mod RAE, no evidence of LAA thrombus, no aortic root dilation.   Clinical depression 12/19/2009   Coronary artery disease, non-occlusive    a. abnormal MV 2019, EF 30-43%; b. LHC 03/2018 Lat Ramus lesion is 40% stenosed. Otherwise minimal CAD, EF 45%.   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   Elevated fasting blood sugar 12/19/2009   Fatigue 12/19/2009   GERD (gastroesophageal reflux disease)    HLD (hyperlipidemia) 12/19/2009   HTN (hypertension)    Sleep apnea     Past Surgical History:  Procedure Laterality Date   HERNIA REPAIR     KNEE SURGERY Left    LEFT HEART CATH AND CORONARY ANGIOGRAPHY N/A 04/15/2018   Procedure: LEFT HEART CATH AND CORONARY ANGIOGRAPHY;  Surgeon: Juan Man, MD;  Location: [facility];  Service: Cardiovascular;  Laterality: N/A;   TEE WITHOUT CARDIOVERSION N/A  04/24/2021   Procedure: TRANSESOPHAGEAL ECHOCARDIOGRAM (TEE);  Surgeon: Juan Perla, MD;  Location: [facility] ENDOSCOPY;  Service: Cardiovascular;  Laterality: N/A;   TEE WITHOUT CARDIOVERSION N/A 06/04/2021   Procedure: TRANSESOPHAGEAL ECHOCARDIOGRAM (TEE);  Surgeon: Elouise Munroe, MD;  Location: [facility];  Service: Cardiology;  Laterality: N/A;   TONSILLECTOMY     TOTAL HIP ARTHROPLASTY Right 06/14/2020   Procedure: TOTAL HIP ARTHROPLASTY ANTERIOR APPROACH;  Surgeon: Gaynelle Arabian, MD;  Location: [facility];  Service: Orthopedics;  Laterality: Right;   WISDOM TOOTH EXTRACTION      Current Medications: Outpatient Medications Prior to Visit  Medication Sig Dispense Refill   amLODipine (NORVASC) 5 MG tablet TAKE 1 TABLET(5 MG) BY MOUTH DAILY 90 tablet 3   buPROPion (WELLBUTRIN XL)  150 MG 24 hr tablet Take 150 mg by mouth every morning.     calcium carbonate (TUMS - DOSED IN MG ELEMENTAL CALCIUM) 500 MG chewable tablet Chew 1-2 tablets by mouth 3 (three) times daily as needed for indigestion or heartburn.      ezetimibe (ZETIA) 10 MG tablet Take 10 mg by mouth daily.     metoprolol succinate (TOPROL-XL) 100 MG 24 hr tablet TAKE 1 TABLET BY MOUTH DAILY WITH OR IMMEDIATELY FOLLOWING A MEAL 90 tablet 1   rivaroxaban (XARELTO) 20 MG TABS tablet Take 1 tablet (20 mg total) by mouth daily with supper. 30 tablet 6   sacubitril-valsartan (ENTRESTO) 97-103 MG Take 1 tablet by mouth 2 (two) times daily. 60 tablet 6   sertraline (ZOLOFT) 100 MG tablet Take 100 mg by mouth daily.      No facility-administered medications prior to visit.     Allergies:   Atorvastatin, Celecoxib, and Pravachol [pravastatin]   Social History   Socioeconomic History   Marital status: Married    Spouse name: Not on file   Number of children: Not on file   Years of education: Not on file   Highest education level: Not on file  Occupational History   Occupation: retired  Tobacco Use   Smoking status: Former     Types: Cigars   Smokeless tobacco: Never   Tobacco comments:    Quit about 3-4 months ago   Scientific laboratory technician Use: Never used  Substance and Sexual Activity   Alcohol use: No   Drug use: No   Sexual activity: Not on file  Other Topics Concern   Not on file  Social History Narrative   Not on file   Social Determinants of Health   Financial Resource Strain: Not on file  Food Insecurity: Not on file  Transportation Needs: Not on file  Physical Activity: Not on file  Stress: Not on file  Social Connections: Not on file     Family History:  [name] includes Heart attack (age of onset: 66) in his brother; Hypertension in his brother and father; Kidney Stones in his mother.   ROS General: Negative; No fevers, chills, or night sweats; positive for fatigue HEENT: Negative; No changes in vision or hearing, sinus congestion, difficulty swallowing Pulmonary: Negative; No cough, wheezing, shortness of breath, hemoptysis Cardiovascular: Chronic systolic and diastolic heart failure, hypertension, hyperlipidemia, paroxysmal atrial fibrillation, and prior LV thrombus with resolution GI: Negative; No nausea, vomiting, diarrhea, or abdominal pain GU: Negative; No dysuria, hematuria, or difficulty voiding Musculoskeletal: Status post hip replacement surgery November 2021, surgery was delayed due to [place] Hematologic/Oncology: Negative; no easy bruising, bleeding Endocrine: Negative; no heat/cold intolerance; no diabetes Neuro: Negative; no changes in balance, headaches Skin: Negative; No rashes or skin lesions Psychiatric: Negative; No behavioral problems, depression Sleep: See HPI Other comprehensive 14 point system review is negative.   PHYSICAL EXAM:   VS:  BP 132/88    Pulse 79    Ht _0  (1.702 m)    Wt 195 lb 3.2 oz (88.5 kg)    SpO2 98%    BMI 30.57 kg/m     Repeat blood pressure by me 114/84.  Wt Readings from Last 3 Encounters:  08/06/21 195 lb 3.2 oz  (88.5 kg)  07/11/21 197 lb 12.8 oz (89.7 kg)  06/04/21 195 lb (88.5 kg)    General: Alert, oriented, no distress.  Skin: normal turgor, no rashes, warm and dry  HEENT: Normocephalic, atraumatic. Pupils equal round and reactive to light; sclera anicteric; extraocular muscles intact;  Nose without nasal septal hypertrophy Mouth/Parynx benign; Mallinpatti scale 3 Neck: No JVD, no carotid bruits; normal carotid upstroke Lungs: clear to ausculatation and percussion; no wheezing or rales Chest Whob[place]: without tenderness to palpitation Heart: PMI not displaced, irregularly irregular with ventricular rate in the 70s to 80s, s1 s2 normal, 1/6 systolic murmur, no diastolic murmur, no rubs, gallops, thrills, or heaves Abdomen: soft, nontender; no hepatosplenomehaly, BS+; abdominal aorta nontender and not dilated by palpation. Back: no CVA tenderness Pulses 2+ Musculoskeletal: full range of motion, normal strength, no joint deformities Extremities: no clubbing cyanosis or edema, Homan's sign negative  Neurologic: grossly nonfocal; Cranial nerves grossly wnl Psychologic: Normal mood and affect   Studies/Labs Reviewed:   August 06, 2021  ECG (independently read by me):  Atrial fibrillation at 79; QTc 474 msec  January 30, 2021 ECG (independently read by me):  Atrial fibrillation at 88 with frequent PVCs and transient bigeminal rhythm, nonspecific STT changes  Recent Labs: BMP Latest Ref Rng & Units 08/06/2021 05/28/2021 04/03/2021  Glucose 70 - 99 mg/dL 112(H) 105(H) 96  BUN 8 - 27 mg/dL _0 Creatinine 0.76 - 1.27 mg/dL 1.27 1.28(H) 1.13  BUN/Creat Ratio 10 - _1 Sodium 134 - 144 mmol/L 140 137 140  Potassium 3.5 - 5.2 mmol/L 4.4 4.2 4.3  Chloride 96 - 106 mmol/L 103 103 103  CO2 20 - 29 mmol/L _2 Calcium 8.6 - 10.2 mg/dL 9.7 9.3 9.2     Hepatic Function Latest Ref Rng & Units 01/31/2021 06/07/2020  Total Protein 6.0 - 8.5 g/dL 6.4 7.1  Albumin 3.8 - 4.8 g/dL 4.5 4.2  AST 0 -  40 IU/L 14 20  ALT 0 - 44 IU/L 12 20  Alk Phosphatase 44 - 121 IU/L 118 93  Total Bilirubin 0.0 - 1.2 mg/dL 0.6 0.9    CBC Latest Ref Rng & Units 08/06/2021 05/28/2021 04/03/2021  WBC 3.4 - 10.8 x10E3/uL 5.7 5.0 5.2  Hemoglobin 13.0 - 17.7 g/dL 16.5 15.7 15.7  Hematocrit 37.5 - 51.0 % 48.7 46.2 46.8  Platelets 150 - 450 x10E3/uL 195 182 178   Lab Results  Component Value Date   MCV 94 08/06/2021   MCV 93 05/28/2021   MCV 94 04/03/2021   Lab Results  Component Value Date   TSH 2.750 01/31/2021   No results found for: HGBA1C   BNP No results found for: BNP  ProBNP No results found for: PROBNP   Lipid Panel     Component Value Date/Time   CHOL 158 01/31/2021 0915   TRIG 99 01/31/2021 0915   HDL 55 01/31/2021 0915   CHOLHDL 2.9 01/31/2021 0915   LDLCALC 85 01/31/2021 0915   LABVLDL 18 01/31/2021 0915     RADIOLOGY: CT CARDIAC MORPH/PULM VEIN W/CM&W/O CA SCORE  Addendum Date: 08/07/2021   ADDENDUM REPORT: 08/07/2021 14:59 CLINICAL DATA:  Atrial fibrillation scheduled for an ablation. EXAM: Cardiac CT/CTA TECHNIQUE: The patient was scanned on a Siemens Somatom scanner. FINDINGS: A 120 kV prospective scan was triggered in the descending thoracic aorta at 111 HU's. Gantry rotation speed was 280 msecs and collimation was .9 mm. No beta blockade and no NTG was given. The 3D data set was reconstructed in 5% intervals of the 60-80 % of the R-R cycle. Diastolic phases were analyzed on a dedicated work station using MPR, MIP and VRT  modes. The patient received 80 cc of contrast. Left atrium: Severely dilated. Filling defect at tip of appendage on initial images, but appears to fill on delayed images (increase in HU from 40 to 80), suggesting slow filling but no thrombus in the left atrium or left atrial appendage. Left ventricle: Normal in size. Right atrium: Moderately dilated Right ventricle: Mildly enlarged Pericardium: Normal thickness Pulmonary veins: Left superior pulmonary vein:  Measures 2.5cm x 1.6cm in diameter with area 3.3cm^2. Left inferior pulmonary vein: Measures 2.0cm x 1.1cm in diameter with area 1.7cm^2. Right superior pulmonary vein: Measures 2.2cm x 1.8cm in diameter with area 3.1cm^2. Right inferior pulmonary vein: Measures 2.4cm x 2.1cm in diameter with area 3.8cm^2. Coronary Arteries: Normal coronary origin. Right dominance. The study was performed without use of NTG and insufficient for plaque evaluation. Calcium score 286 (64th percentile) Esophagus: Courses posterior to ostium of left sided pulmonary veins IMPRESSION: 1. There is normal pulmonary vein drainage into the left atrium. Measurements as reported 2. There is a filling defect in left atrial appendage on initial images, but appears to fill on delayed images (increase in HU from 40 to 80 at appendage tip), suggesting slow filling but no thrombus in the left atrial appendage. 3. The esophagus courses posterior to ostium of left sided pulmonary veins 4.  Coronary calcium score 286 (64th percentile) Electronically Signed   By: Oswaldo Milian M.D.   On: 08/07/2021 14:59   Result Date: 08/07/2021 EXAM: OVER-READ INTERPRETATION  CT CHEST The following report is an over-read performed by radiologist Dr. Abigail Miyamoto of [facility], [place] on 08/07/2021. This over-read does not include interpretation of cardiac or coronary anatomy or pathology. The coronary CTA interpretation by the cardiologist is attached. COMPARISON:  04/18/2021 FINDINGS: Vascular: Aortic atherosclerosis. No central pulmonary embolism, on this non-dedicated study. Mediastinum/Nodes: No imaged thoracic adenopathy. Lungs/Pleura: No pleural fluid.  Clear imaged lungs. Upper Abdomen: Normal imaged portions of the liver, spleen, stomach. Musculoskeletal: No acute osseous abnormality. IMPRESSION: 1.  No acute findings in the imaged extracardiac chest. 2.  Aortic Atherosclerosis (ICD10-I70.0). Electronically Signed: By: Abigail Miyamoto M.D. On:  08/07/2021 13:41     Additional studies/ records that were reviewed today include:    03/30/2019 CLINICAL INFORMATION Sleep Study Type: Split Night CPAP   Indication for sleep study: Fatigue, Hypertension, Obesity, Snoring   Epworth Sleepiness Score: 6   SLEEP STUDY TECHNIQUE As per the AASM Manual for the Scoring of Sleep and Associated Events v2.3 (April 2016) with a hypopnea requiring 4% desaturations.   The channels recorded and monitored were frontal, central and occipital EEG, electrooculogram (EOG), submentalis EMG (chin), nasal and oral airflow, thoracic and abdominal Perkins motion, anterior tibialis EMG, snore microphone, electrocardiogram, and pulse oximetry. Continuous positive airway pressure (CPAP) was initiated when the patient met split night criteria and was titrated according to treat sleep-disordered breathing.   MEDICATIONS     amLODipine (NORVASC) 5 MG tablet (Expired)             apixaban (ELIQUIS) 5 MG TABS tablet         calcium carbonate (TUMS) 500 MG chewable tablet         Cholecalciferol (VITAMIN D3) 2000 units TABS         ENTRESTO 97-103 MG         metoprolol succinate (TOPROL-XL) 50 MG 24 hr tablet         sertraline (ZOLOFT) 100 MG tablet       Medications self-administered by  patient taken the night of the study : N/A   RESPIRATORY PARAMETERS Diagnostic Total AHI (/hr):            41.3     RDI (/hr):         62.7     OA Index (/hr):            3.9       CA Index (/hr):      0.5 REM AHI (/hr):            70.6     NREM AHI (/hr):          39.1     Supine AHI (/hr):         38.5     Non-supine AHI (/hr):        46.4 Min O2 Sat (%):          86.0     Mean O2 (%):  93.2     Time below 88% (min):           1.7          Titration Optimal Pressure (cm):           13        AHI at Optimal Pressure (/hr):            8.4       Min O2 at Optimal Pressure (%):       92.0 Supine % at Optimal (%):       100      Sleep % at Optimal (%):         99           SLEEP  ARCHITECTURE The recording time for the entire night was 428.6 minutes.   During a baseline period of 234.6 minutes, the patient slept for 123.5 minutes in REM and nonREM, yielding a sleep efficiency of 52.6%%. Sleep onset after lights out was 79.8 minutes with a REM latency of 88.5 minutes. The patient spent 22.3%% of the night in stage N1 sleep, 70.9%% in stage N2 sleep, 0.0%% in stage N3 and 6.9% in REM.   During the titration period of 188.2 minutes, the patient slept for 155.5 minutes in REM and nonREM, yielding a sleep efficiency of 82.6%%. Sleep onset after CPAP initiation was 9.9 minutes with a REM latency of 5.5 minutes. The patient spent 14.5%% of the night in stage N1 sleep, 76.8%% in stage N2 sleep, 0.0%% in stage N3 and 8.7% in REM.   CARDIAC DATA The 2 lead EKG demonstrated sinus rhythm. The mean heart rate was 100.0 beats per minute. Other EKG findings include: PVCs.   LEG MOVEMENT DATA The total Periodic Limb Movements of Sleep (PLMS) were 0. The PLMS index was 0.0 .   IMPRESSIONS - Severe obstructive sleep apnea occurred during the diagnostic portion of the study (AHI 41.3/h; RDI 62.7/h). Events were very severe during REM sleep (AHI 70.6/h). CPAP was initiated at 5 cm and was titrated to 13 cm of water. AHI at 13 cm was 8.4/h and RDI was 11.3/h with 02 nadir at 92%. - No significant central sleep apnea occurred during the diagnostic portion of the study (CAI = 0.5/hour). - Mild oxygen desaturation to a nadir of 86.0%. - The patient snored with moderate snoring volume during the diagnostic portion of the study. - EKG findings include PVCs. - Clinically significant periodic limb movements did not occur during sleep.   DIAGNOSIS - Obstructive Sleep Apnea (  327.23 [G47.33 ICD-10])   RECOMMENDATIONS - Recommend an initial trial of CPAP Auto therapy with EPR at a range of 12 - 20 cm with heated humidification.  A Medium size Fisher&Paykel Full Face Mask Simplus mask was used for  the titration study. - Effort should be made to optimize nasal and oropharyngeal patency. - Avoid alcohol, sedatives and other CNS depressants that may worsen sleep apnea and disrupt normal sleep architecture. - Sleep hygiene should be reviewed to assess factors that may improve sleep quality. - Weight management (BMI 30) and regular exercise should be initiated or continued. - Recommend a download in 30 days and sleep clinic evaluation after 4 weeks of therapy.   ASSESSMENT:    1. OSA (obstructive sleep apnea)   2. Persistent atrial fibrillation ([place])   3. Chronic combined systolic and diastolic congestive heart failure ([place])   4. Essential hypertension   5. NICM (nonischemic cardiomyopathy) ([place])   6. Pure hypercholesterolemia     PLAN:  Juan Montes is a 71 year old gentleman who has significant cardiovascular comorbidities including chronic systolic and diastolic heart failure, hypertension, hyperlipidemia, prior LV thrombus with resolution, as well as  atrial fibrillation.  In 2020, due to symptoms highly suggestive of obstructive sleep apnea he underwent a split-night 6 sleep study which confirmed severe with an AHI of 41.3/h, RDI 62.7/h and during rem sleep AHI was 70.6/h.  CPAP was titrated up to 13 cm water pressure.  Throughout the protocol the patient had PVCs.  There also was moderate snoring which resolved with CPAP.  Apparently, he only tried CPAP for several months and then essentially stopped using treatment.  Unfortunately due to the [place] pandemic he never followed up in sleep clinic.  Fortunately, an initial download from May 18, 2019 through June 16, 2019 confirmed excellent compliance with 100% use and usage greater than 4 hours was 77%.  However usage duration was suboptimal at only 5 hours and 41 minutes.  Fortunately he met compliance standards and therefore still has his machine.  AHI at that time was 6.5 and his 95th percentile pressure was 14.2 with a maximum  average pressure of 15.3.  When I saw him for my initial sleep evaluation on January 30, 2021 I spent considerable time with him educating him about sleep apnea and its disruptive effects on normal sleep architecture and the adverse cardiovascular consequences.  Presently he has been in persistent atrial fibrillation and is scheduled to undergo ablation with Dr. Curt Montes on August 10, 2021.  He has been using CPAP and I obtained a download today from December 6 through May 01, 2022.  He has used treatment 100% with the exception of a 8-day period where he developed pneumonia and upper respiratory symptoms for which she was unable to use treatment.  Average use was 7 hours and 26 minutes.  He is set at a pressure range of 10 to 20 cm.  AHI is now improved at 2.4/h.  He is using and ResMed AirFit N 30i medium mask without significant leak.  His blood pressure today is stable on therapy.  He continues to be on Entresto 97/103 mg twice a day, metoprolol 100 mg daily in addition to amlodipine 5 mg.  He is on sertraline and bupropion for depression.  He is on Zetia for hyperlipidemia and is anticoagulated on Xarelto.  He will follow-up with Juan Montes and Dr. Curt Montes.  I will see him in 1 year for for follow-up sleep evaluation.   Medication Adjustments/Labs and  Tests Ordered: Current medicines are reviewed at length with the patient today.  Concerns regarding medicines are outlined above.  Medication changes, Labs and Tests ordered today are listed in the Patient Instructions below. Patient Instructions  Medication Instructions:  The current medical regimen is effective;  continue present plan and medications as directed. Please refer to the Current Medication list given to you today.   *If you need a refill on your cardiac medications before your next appointment, please call your pharmacy*  Lab Work:   Testing/Procedures:  none    none  Follow-Up: Your next appointment:  12 month(s) In Person with  Juan Dresser, MD   Please call our office 2 months in advance to schedule this appointment.  At [facility], you and your health needs are our priority.  As part of our continuing mission to provide you with exceptional heart care, we have created designated Provider Care Teams.  These Care Teams include your primary Cardiologist (physician) and Advanced Practice Providers (APPs -  Physician Assistants and Nurse Practitioners) who all work together to provide you with the care you need, when you need it.     Signed, Juan Majestic, MD  08/07/2021 6:53 PM    [place] [address], [place], [place], [place]  [address] Phone: [phone]

## 2021-08-06 NOTE — Telephone Encounter (Signed)
VM box full, cannot leave mesasge for call back Marchia Bond RN Navigator Cardiac Imaging [facility] [phone] Office  [phone] Cell

## 2021-08-06 NOTE — Patient Instructions (Signed)
Medication Instructions:  The current medical regimen is effective;  continue present plan and medications as directed. Please refer to the Current Medication list given to you today.   *If you need a refill on your cardiac medications before your next appointment, please call your pharmacy*  Lab Work:   Testing/Procedures:  none    none  Follow-Up: Your next appointment:  12 month(s) In Person with Buford Dresser, MD   Please call our office 2 months in advance to schedule this appointment.  At [facility], you and your health needs are our priority.  As part of our continuing mission to provide you with exceptional heart care, we have created designated Provider Care Teams.  These Care Teams include your primary Cardiologist (physician) and Advanced Practice Providers (APPs -  Physician Assistants and Nurse Practitioners) who all work together to provide you with the care you need, when you need it.

## 2021-08-07 ENCOUNTER — Encounter: Payer: Self-pay | Admitting: Cardiovascular Disease

## 2021-08-07 ENCOUNTER — Ambulatory Visit (HOSPITAL_COMMUNITY)
Admission: RE | Admit: 2021-08-07 | Discharge: 2021-08-07 | Disposition: A | Discharge status: Home / Self Care | Payer: PPO | Source: Ambulatory Visit | Attending: Cardiology | Admitting: Cardiology

## 2021-08-07 ENCOUNTER — Encounter (HOSPITAL_COMMUNITY): Payer: Self-pay

## 2021-08-07 DIAGNOSIS — I480 Paroxysmal atrial fibrillation: Secondary | ICD-10-CM | POA: Insufficient documentation

## 2021-08-07 MED ORDER — IOHEXOL 350 MG/ML SOLN
95.0000 mL | Freq: Once | INTRAVENOUS | Status: AC | PRN
  Administered 2021-08-07: 95 mL via INTRAVENOUS

## 2021-08-08 ENCOUNTER — Other Ambulatory Visit: Payer: Self-pay

## 2021-08-08 ENCOUNTER — Encounter: Payer: Self-pay | Admitting: Cardiology

## 2021-08-08 ENCOUNTER — Ambulatory Visit: Payer: PPO | Admitting: Cardiology

## 2021-08-08 ENCOUNTER — Telehealth: Payer: Self-pay | Admitting: *Deleted

## 2021-08-08 VITALS — BP 128/88 | HR 77 | Ht 67.0 in | Wt 191.6 lb

## 2021-08-08 DIAGNOSIS — I4819 Other persistent atrial fibrillation: Secondary | ICD-10-CM | POA: Diagnosis not present

## 2021-08-08 DIAGNOSIS — G4733 Obstructive sleep apnea (adult) (pediatric): Secondary | ICD-10-CM | POA: Diagnosis not present

## 2021-08-08 DIAGNOSIS — I5022 Chronic systolic (congestive) heart failure: Secondary | ICD-10-CM | POA: Diagnosis not present

## 2021-08-08 NOTE — Telephone Encounter (Signed)
Pt informed procedure time on Friday has been moved up. Pt aware to arrive to [facility] at 5:30 am. Patient verbalized understanding and agreeable to plan.

## 2021-08-08 NOTE — Progress Notes (Signed)
Electrophysiology Office Note   Date:  08/08/2021   ID:  Juan Montes, DOB May 03, 1951, MRN [id number]  PCP:  Crist Infante, MD  Cardiologist:  [name] Primary Electrophysiologist:  Horris Speros Meredith Leeds, MD    Chief Complaint: AF   History of Present Illness: Juan Montes is a 71 y.o. male who is being seen today for the evaluation of AF at the request of Crist Infante, MD. Presenting today for electrophysiology evaluation.  He has a history similar for chronic systolic heart failure, hypertension, OSA, hyperlipidemia, persistent atrial fibrillation.  He was diagnosed with atrial fibrillation at a work event.  This was thought to be stress related.  He went to his PCP and was found to be in atrial fibrillation with symptoms of fatigue and chest discomfort.  He initially had an ablation attempt, but had a thrombus in his left atrial appendage.  He was switched to Xarelto.  A repeat imaging showed resolution of his thrombus.  Today, denies symptoms of palpitations, chest pain, shortness of breath, orthopnea, PND, lower extremity edema, claudication, dizziness, presyncope, syncope, bleeding, or neurologic sequela. The patient is tolerating medications without difficulties.  He continues to have fatigue and shortness of breath.  He feels that this is due to both heart failure and atrial fibrillation.  He is ready to get back into normal rhythm.  He is ready for ablation.   Past Medical History:  Diagnosis Date   A-fib ([place]) 09/20/2013   Anxiety disorder 12/19/2009   Arthritis of knee, degenerative 12/19/2009   BP (high blood pressure) 12/19/2009   Chronic combined systolic and diastolic CHF (congestive heart failure) ([place])    a. MV/LHC 2019 EF 30-45%; b.Echo 2020 EF 30-35%, mod red LVSF, mod LAE, mild aortic root dialtion, 40 mm; c.TEE 9/22 EF 20-25%, red LVF, LAA thrombus, severe LAE, mild MR/AR, mild plaque des aorta; d. TEE 10/22 EF 30%, dec LVF, RVSF, sev LAE, mod RAE, no evidence of  LAA thrombus, no aortic root dilation.   Clinical depression 12/19/2009   Coronary artery disease, non-occlusive    a. abnormal MV 2019, EF 30-43%; b. LHC 03/2018 Lat Ramus lesion is 40% stenosed. Otherwise minimal CAD, EF 45%.   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   Elevated fasting blood sugar 12/19/2009   Fatigue 12/19/2009   GERD (gastroesophageal reflux disease)    HLD (hyperlipidemia) 12/19/2009   HTN (hypertension)    Sleep apnea    Past Surgical History:  Procedure Laterality Date   HERNIA REPAIR     KNEE SURGERY Left    LEFT HEART CATH AND CORONARY ANGIOGRAPHY N/A 04/15/2018   Procedure: LEFT HEART CATH AND CORONARY ANGIOGRAPHY;  Surgeon: Leonie Man, MD;  Location: [place] [facility];  Service: Cardiovascular;  Laterality: N/A;   TEE WITHOUT CARDIOVERSION N/A 04/24/2021   Procedure: TRANSESOPHAGEAL ECHOCARDIOGRAM (TEE);  Surgeon: Lelon Perla, MD;  Location: [facility] ENDOSCOPY;  Service: Cardiovascular;  Laterality: N/A;   TEE WITHOUT CARDIOVERSION N/A 06/04/2021   Procedure: TRANSESOPHAGEAL ECHOCARDIOGRAM (TEE);  Surgeon: Elouise Munroe, MD;  Location: [place];  Service: Cardiology;  Laterality: N/A;   TONSILLECTOMY     TOTAL HIP ARTHROPLASTY Right 06/14/2020   Procedure: TOTAL HIP ARTHROPLASTY ANTERIOR APPROACH;  Surgeon: Gaynelle Arabian, MD;  Location: [facility];  Service: Orthopedics;  Laterality: Right;   WISDOM TOOTH EXTRACTION       Current Outpatient Medications  Medication Sig Dispense Refill  amLODipine (NORVASC) 5 MG tablet TAKE 1 TABLET(5 MG) BY MOUTH DAILY 90 tablet 3   buPROPion (WELLBUTRIN XL) 150 MG 24 hr tablet Take 150 mg by mouth every morning.     calcium carbonate (TUMS - DOSED IN MG ELEMENTAL CALCIUM) 500 MG chewable tablet Chew 1-2 tablets by mouth 3 (three) times daily as needed for indigestion or heartburn.      ezetimibe (ZETIA) 10 MG tablet Take 10 mg by mouth daily.      metoprolol succinate (TOPROL-XL) 100 MG 24 hr tablet TAKE 1 TABLET BY MOUTH DAILY WITH OR IMMEDIATELY FOLLOWING A MEAL 90 tablet 1   rivaroxaban (XARELTO) 20 MG TABS tablet Take 1 tablet (20 mg total) by mouth daily with supper. 30 tablet 6   sacubitril-valsartan (ENTRESTO) 97-103 MG Take 1 tablet by mouth 2 (two) times daily. 60 tablet 6   sertraline (ZOLOFT) 100 MG tablet Take 100 mg by mouth daily.      No current facility-administered medications for this visit.    Allergies:   Apixaban, Atorvastatin, Celecoxib, and Pravastatin   Social History:  The patient  reports that he has quit smoking. His smoking use included cigars. He has never used smokeless tobacco. He reports that he does not drink alcohol and does not use drugs.   Family History:  [name] includes Heart attack (age of onset: 69) in his brother; Hypertension in his brother and father; Kidney Stones in his mother.    ROS:  Please see the history of present illness.   Otherwise, review of systems is positive for none.   All other systems are reviewed and negative.   PHYSICAL EXAM: VS:  BP 128/88    Pulse 77    Ht 5\' 7"  (1.702 m)    Wt 191 lb 9.6 oz (86.9 kg)    SpO2 95%    BMI 30.01 kg/m  , BMI Body mass index is 30.01 kg/m. GEN: Well nourished, well developed, in no acute distress  HEENT: normal  Neck: no JVD, carotid bruits, or masses Cardiac: irregular; no murmurs, rubs, or gallops,no edema  Respiratory:  clear to auscultation bilaterally, normal work of breathing GI: soft, nontender, nondistended, + BS MS: no deformity or atrophy  Skin: warm and dry Neuro:  Strength and sensation are intact Psych: euthymic mood, full affect  EKG:  EKG is not ordered today. Personal review of the ekg ordered 05/08/21 shows atrial fibrillation, rate 72  Recent Labs: 01/31/2021: ALT 12; Magnesium 2.0; TSH 2.750 08/06/2021: BUN 19; Creatinine, Ser 1.27; Hemoglobin 16.5; Platelets 195; Potassium 4.4; Sodium 140     Lipid Panel     Component Value Date/Time   CHOL 158 01/31/2021 0915   TRIG 99 01/31/2021 0915   HDL 55 01/31/2021 0915   CHOLHDL 2.9 01/31/2021 0915   LDLCALC 85 01/31/2021 0915     Wt Readings from Last 3 Encounters:  08/08/21 191 lb 9.6 oz (86.9 kg)  08/06/21 195 lb 3.2 oz (88.5 kg)  07/11/21 197 lb 12.8 oz (89.7 kg)      Other studies Reviewed: Additional studies/ records that were reviewed today include: TTE 02/20/21  Review of the above records today demonstrates:   1. Left ventricular ejection fraction, by estimation, is 30 to 35%. Left  ventricular ejection fraction by PLAX is 33 %. The left ventricle has  moderately decreased function. The left ventricle demonstrates global  hypokinesis. The left ventricular  internal cavity size was mildly dilated. Left ventricular diastolic  function could not be evaluated.   2. Right ventricular systolic function is mildly reduced. The right  ventricular size is normal. There is normal pulmonary artery systolic  pressure. The estimated right ventricular systolic pressure is 50.0 mmHg.   3. Left atrial size was severely dilated.   4. The mitral valve is abnormal. Mild mitral valve regurgitation.   5. The aortic valve is tricuspid. Aortic valve regurgitation is mild.   6. Aortic dilatation noted. There is mild dilatation of the aortic root,  measuring 39 mm.   7. The inferior vena cava is normal in size with greater than 50%  respiratory variability, suggesting right atrial pressure of 3 mmHg.   ASSESSMENT AND PLAN:  1.  Persistent atrial fibrillation: Currently on Xarelto 20 mg daily, Toprol-XL 100 mg daily.  He unfortunately had a thrombus in his left atrial appendage prior to his ablation attempt.  Repeat CT shows no thrombus.  We Canna Nickelson plan for ablation.  Risk, benefits, and alternatives to EP study and radiofrequency ablation for afib were also discussed in detail today. These risks include but are not limited to stroke,  bleeding, vascular damage, tamponade, perforation, damage to the esophagus, lungs, and other structures, pulmonary vein stenosis, worsening renal function, and death. The patient understands these risk and wishes to proceed.  We Tannar Broker therefore proceed with catheter ablation at the next available time.  Carto, ICE, anesthesia are requested for the procedure.  Jaecion Dempster also obtain CT PV protocol prior to the procedure to exclude LAA thrombus and further evaluate atrial anatomy.   2.  Obstructive sleep apnea: CPAP compliance encouraged  3.  Chronic systolic and diastolic heart failure: Ejection fraction 30 to 35%.  Currently on optimal medical therapy with Toprol-XL 100 mg daily, Entresto 97/103 mg twice daily.  Yamna Mackel likely need to discuss ICD therapy post ablation.  Current medicines are reviewed at length with the patient today.   The patient does not have concerns regarding his medicines.  The following changes were made today: None  Labs/ tests ordered today include:  No orders of the defined types were placed in this encounter.     Disposition:   FU with Tyreek Clabo 3 months  Signed, Hesham Womac Meredith Leeds, MD  08/08/2021 8:51 AM     [facility] [address] [place] [address] [phone] (office) [phone] (fax)

## 2021-08-08 NOTE — Patient Instructions (Addendum)
Medication Instructions:  Your physician recommends that you continue on your current medications as directed. Please refer to the Current Medication list given to you today.  *If you need a refill on your cardiac medications before your next appointment, please call your pharmacy*   Lab Work: None ordered  If you have labs (blood work) drawn today and your tests are completely normal, you will receive your results only by: [place] (if you have MyChart) OR A paper copy in the mail If you have any lab test that is abnormal or we need to change your treatment, we will call you to review the results.   Testing/Procedures: Your physician has requested that you have cardiac CT within 7 days PRIOR to your ablation. Cardiac computed tomography (CT) is a painless test that uses an x-ray machine to take clear, detailed pictures of your heart.  Please follow instruction below located under "other instructions". You will get a call from our office to schedule the date for this test.  Your physician has recommended that you have an ablation. Catheter ablation is a medical procedure used to treat some cardiac arrhythmias (irregular heartbeats). During catheter ablation, a long, thin, flexible tube is put into a blood vessel in your groin (upper thigh), or neck. This tube is called an ablation catheter. It is then guided to your heart through the blood vessel. Radio frequency waves destroy small areas of heart tissue where abnormal heartbeats may cause an arrhythmia to start. Please follow instruction below located under "other instructions".   Follow-Up: At [facility], you and your health needs are our priority.  As part of our continuing mission to provide you with exceptional heart care, we have created designated Provider Care Teams.  These Care Teams include your primary Cardiologist (physician) and Advanced Practice Providers (APPs -  Physician Assistants and Nurse Practitioners) who all  work together to provide you with the care you need, when you need it.   Your next appointment:   1 month(s) after your ablation  The format for your next appointment:   In Person  Provider:   AFib clinic   Thank you for choosing [facility]!!   Trinidad Curet, RN [phone]    Other Instructions   Electrophysiology/Ablation Procedure Instructions   You are scheduled for a(n)  ablation on 08/10/2021 with Dr. Allegra Lai.   1.   Pre procedure testing-             A.  LAB WORK --- On 1/09//2023 for your pre procedure blood work.     On the day of your procedure 08/10/2021 you will go to [facility] [phone]) at 8:30 am.  Dennis Bast will go to the main entrance A [employer]) and enter where the [employer] are.  Your driver will drop you off and you will head down the hallway to ADMITTING.  You may have one support person come in to the hospital with you.  They will be asked to wait in the waiting room. It is OK to have someone drop you off and come back when you are ready to be discharged.   3.   Do not eat or drink after midnight prior to your procedure.   4.   On the morning of your procedure do NOT take any medication. Do not miss any doses of your blood thinner prior to the morning of your procedure or your procedure will need to be rescheduled.   5.  Plan for an overnight stay but you may be discharged after your procedure, if you use your phone frequently bring your phone charger. If you are discharged after your procedure you will need someone to drive you home and be with you for 24 hours after your procedure.   6. You will follow up with the AFIB clinic 4 weeks after your procedure.  You will follow up with Dr. Curt Bears  3 months after your procedure.  These appointments will be made for you.   7. FYI: For your safety, and to allow [place] to monitor your vital signs accurately during the surgery/procedure we request that if you have artificial nails,  gel coating, SNS etc. Please have those removed prior to your surgery/procedure. Not having the nail coverings /polish removed may result in cancellation or delay of your surgery/procedure.  * If you have ANY questions please call the office [phone] and ask for Jarman Litton RN or send me a MyChart message   * Occasionally, EP Studies and ablations can become lengthy.  Please make your family aware of this before your procedure starts.  Average time ranges from 2-8 hours for EP studies/ablations.  Your physician will call your family after the procedure with the results.                                   Cardiac Ablation Cardiac ablation is a procedure to destroy (ablate) some heart tissue that is sending bad signals. These bad signals cause problems in heart rhythm. The heart has many areas that make these signals. If there are problems in these areas, they can make the heart beat in a way that is not normal. Destroying some tissues can help make the heart rhythm normal. Tell your doctor about: Any allergies you have. All medicines you are taking. These include vitamins, herbs, eye drops, creams, and over-the-counter medicines. Any problems you or family members have had with medicines that make you fall asleep (anesthetics). Any blood disorders you have. Any surgeries you have had. Any medical conditions you have, such as kidney failure. Whether you are pregnant or may be pregnant. What are the risks? This is a safe procedure. But problems may occur, including: Infection. Bruising and bleeding. Bleeding into the chest. Stroke or blood clots. Damage to nearby areas of your body. Allergies to medicines or dyes. The need for a pacemaker if the normal system is damaged. Failure of the procedure to treat the problem. What happens before the procedure? Medicines Ask your doctor about: Changing or stopping your normal medicines. This is important. Taking aspirin and ibuprofen. Do not take  these medicines unless your doctor tells you to take them. Taking other medicines, vitamins, herbs, and supplements. General instructions Follow instructions from your doctor about what you cannot eat or drink. Plan to have someone take you home from the hospital or clinic. If you will be going home right after the procedure, plan to have someone with you for 24 hours. Ask your doctor what steps will be taken to prevent infection. What happens during the procedure?  An IV tube will be put into one of your veins. You will be given a medicine to help you relax. The skin on your neck or groin will be numbed. A cut (incision) will be made in your neck or groin. A needle will be put through your cut and into a large vein. A tube (catheter) will  be put into the needle. The tube will be moved to your heart. Dye may be put through the tube. This helps your doctor see your heart. Small devices (electrodes) on the tube will send out signals. A type of energy will be used to destroy some heart tissue. The tube will be taken out. Pressure will be held on your cut. This helps stop bleeding. A bandage will be put over your cut. The exact procedure may vary among doctors and hospitals. What happens after the procedure? You will be watched until you leave the hospital or clinic. This includes checking your heart rate, breathing rate, oxygen, and blood pressure. Your cut will be watched for bleeding. You will need to lie still for a few hours. Do not drive for 24 hours or as long as your doctor tells you. Summary Cardiac ablation is a procedure to destroy some heart tissue. This is done to treat heart rhythm problems. Tell your doctor about any medical conditions you may have. Tell him or her about all medicines you are taking to treat them. This is a safe procedure. But problems may occur. These include infection, bruising, bleeding, and damage to nearby areas of your body. Follow what your doctor tells  you about food and drink. You may also be told to change or stop some of your medicines. After the procedure, do not drive for 24 hours or as long as your doctor tells you. This information is not intended to replace advice given to you by your health care provider. Make sure you discuss any questions you have with your health care provider. Document Revised: 06/17/2019 Document Reviewed: 06/17/2019 Elsevier Patient Education  2022 [employer].

## 2021-08-08 NOTE — H&P (View-Only) (Signed)
Electrophysiology Office Note   Date:  08/08/2021   ID:  TIMBER LUCARELLI, DOB 1950/09/22, MRN [id number]  PCP:  Crist Infante, MD  Cardiologist:  [name] Primary Electrophysiologist:  Tyvon Eggenberger Meredith Leeds, MD    Chief Complaint: AF   History of Present Illness: Juan Montes is a 71 y.o. male who is being seen today for the evaluation of AF at the request of Crist Infante, MD. Presenting today for electrophysiology evaluation.  He has a history similar for chronic systolic heart failure, hypertension, OSA, hyperlipidemia, persistent atrial fibrillation.  He was diagnosed with atrial fibrillation at a work event.  This was thought to be stress related.  He went to his PCP and was found to be in atrial fibrillation with symptoms of fatigue and chest discomfort.  He initially had an ablation attempt, but had a thrombus in his left atrial appendage.  He was switched to Xarelto.  A repeat imaging showed resolution of his thrombus.  Today, denies symptoms of palpitations, chest pain, shortness of breath, orthopnea, PND, lower extremity edema, claudication, dizziness, presyncope, syncope, bleeding, or neurologic sequela. The patient is tolerating medications without difficulties.  He continues to have fatigue and shortness of breath.  He feels that this is due to both heart failure and atrial fibrillation.  He is ready to get back into normal rhythm.  He is ready for ablation.   Past Medical History:  Diagnosis Date   A-fib ([place]) 09/20/2013   Anxiety disorder 12/19/2009   Arthritis of knee, degenerative 12/19/2009   BP (high blood pressure) 12/19/2009   Chronic combined systolic and diastolic CHF (congestive heart failure) ([place])    a. MV/LHC 2019 EF 30-45%; b.Echo 2020 EF 30-35%, mod red LVSF, mod LAE, mild aortic root dialtion, 40 mm; c.TEE 9/22 EF 20-25%, red LVF, LAA thrombus, severe LAE, mild MR/AR, mild plaque des aorta; d. TEE 10/22 EF 30%, dec LVF, RVSF, sev LAE, mod RAE, no evidence of  LAA thrombus, no aortic root dilation.   Clinical depression 12/19/2009   Coronary artery disease, non-occlusive    a. abnormal MV 2019, EF 30-43%; b. LHC 03/2018 Lat Ramus lesion is 40% stenosed. Otherwise minimal CAD, EF 45%.   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   Elevated fasting blood sugar 12/19/2009   Fatigue 12/19/2009   GERD (gastroesophageal reflux disease)    HLD (hyperlipidemia) 12/19/2009   HTN (hypertension)    Sleep apnea    Past Surgical History:  Procedure Laterality Date   HERNIA REPAIR     KNEE SURGERY Left    LEFT HEART CATH AND CORONARY ANGIOGRAPHY N/A 04/15/2018   Procedure: LEFT HEART CATH AND CORONARY ANGIOGRAPHY;  Surgeon: Leonie Man, MD;  Location: [place] [facility];  Service: Cardiovascular;  Laterality: N/A;   TEE WITHOUT CARDIOVERSION N/A 04/24/2021   Procedure: TRANSESOPHAGEAL ECHOCARDIOGRAM (TEE);  Surgeon: Lelon Perla, MD;  Location: [facility];  Service: Cardiovascular;  Laterality: N/A;   TEE WITHOUT CARDIOVERSION N/A 06/04/2021   Procedure: TRANSESOPHAGEAL ECHOCARDIOGRAM (TEE);  Surgeon: Elouise Munroe, MD;  Location: [facility];  Service: Cardiology;  Laterality: N/A;   TONSILLECTOMY     TOTAL HIP ARTHROPLASTY Right 06/14/2020   Procedure: TOTAL HIP ARTHROPLASTY ANTERIOR APPROACH;  Surgeon: Gaynelle Arabian, MD;  Location: [facility];  Service: Orthopedics;  Laterality: Right;   WISDOM TOOTH EXTRACTION       Current Outpatient Medications  Medication Sig Dispense Refill  amLODipine (NORVASC) 5 MG tablet TAKE 1 TABLET(5 MG) BY MOUTH DAILY 90 tablet 3   buPROPion (WELLBUTRIN XL) 150 MG 24 hr tablet Take 150 mg by mouth every morning.     calcium carbonate (TUMS - DOSED IN MG ELEMENTAL CALCIUM) 500 MG chewable tablet Chew 1-2 tablets by mouth 3 (three) times daily as needed for indigestion or heartburn.      ezetimibe (ZETIA) 10 MG tablet Take 10 mg by mouth daily.      metoprolol succinate (TOPROL-XL) 100 MG 24 hr tablet TAKE 1 TABLET BY MOUTH DAILY WITH OR IMMEDIATELY FOLLOWING A MEAL 90 tablet 1   rivaroxaban (XARELTO) 20 MG TABS tablet Take 1 tablet (20 mg total) by mouth daily with supper. 30 tablet 6   sacubitril-valsartan (ENTRESTO) 97-103 MG Take 1 tablet by mouth 2 (two) times daily. 60 tablet 6   sertraline (ZOLOFT) 100 MG tablet Take 100 mg by mouth daily.      No current facility-administered medications for this visit.    Allergies:   Apixaban, Atorvastatin, Celecoxib, and Pravastatin   Social History:  The patient  reports that he has quit smoking. His smoking use included cigars. He has never used smokeless tobacco. He reports that he does not drink alcohol and does not use drugs.   Family History:  [name] includes Heart attack (age of onset: 33) in his brother; Hypertension in his brother and father; Kidney Stones in his mother.    ROS:  Please see the history of present illness.   Otherwise, review of systems is positive for none.   All other systems are reviewed and negative.   PHYSICAL EXAM: VS:  BP 128/88    Pulse 77    Ht 5\' 7"  (1.702 m)    Wt 191 lb 9.6 oz (86.9 kg)    SpO2 95%    BMI 30.01 kg/m  , BMI Body mass index is 30.01 kg/m. GEN: Well nourished, well developed, in no acute distress  HEENT: normal  Neck: no JVD, carotid bruits, or masses Cardiac: irregular; no murmurs, rubs, or gallops,no edema  Respiratory:  clear to auscultation bilaterally, normal work of breathing GI: soft, nontender, nondistended, + BS MS: no deformity or atrophy  Skin: warm and dry Neuro:  Strength and sensation are intact Psych: euthymic mood, full affect  EKG:  EKG is not ordered today. Personal review of the ekg ordered 05/08/21 shows atrial fibrillation, rate 72  Recent Labs: 01/31/2021: ALT 12; Magnesium 2.0; TSH 2.750 08/06/2021: BUN 19; Creatinine, Ser 1.27; Hemoglobin 16.5; Platelets 195; Potassium 4.4; Sodium 140     Lipid Panel     Component Value Date/Time   CHOL 158 01/31/2021 0915   TRIG 99 01/31/2021 0915   HDL 55 01/31/2021 0915   CHOLHDL 2.9 01/31/2021 0915   LDLCALC 85 01/31/2021 0915     Wt Readings from Last 3 Encounters:  08/08/21 191 lb 9.6 oz (86.9 kg)  08/06/21 195 lb 3.2 oz (88.5 kg)  07/11/21 197 lb 12.8 oz (89.7 kg)      Other studies Reviewed: Additional studies/ records that were reviewed today include: TTE 02/20/21  Review of the above records today demonstrates:   1. Left ventricular ejection fraction, by estimation, is 30 to 35%. Left  ventricular ejection fraction by PLAX is 33 %. The left ventricle has  moderately decreased function. The left ventricle demonstrates global  hypokinesis. The left ventricular  internal cavity size was mildly dilated. Left ventricular diastolic  function could not be evaluated.   2. Right ventricular systolic function is mildly reduced. The right  ventricular size is normal. There is normal pulmonary artery systolic  pressure. The estimated right ventricular systolic pressure is 50.0 mmHg.   3. Left atrial size was severely dilated.   4. The mitral valve is abnormal. Mild mitral valve regurgitation.   5. The aortic valve is tricuspid. Aortic valve regurgitation is mild.   6. Aortic dilatation noted. There is mild dilatation of the aortic root,  measuring 39 mm.   7. The inferior vena cava is normal in size with greater than 50%  respiratory variability, suggesting right atrial pressure of 3 mmHg.   ASSESSMENT AND PLAN:  1.  Persistent atrial fibrillation: Currently on Xarelto 20 mg daily, Toprol-XL 100 mg daily.  He unfortunately had a thrombus in his left atrial appendage prior to his ablation attempt.  Repeat CT shows no thrombus.  We Kashton Mcartor plan for ablation.  Risk, benefits, and alternatives to EP study and radiofrequency ablation for afib were also discussed in detail today. These risks include but are not limited to stroke,  bleeding, vascular damage, tamponade, perforation, damage to the esophagus, lungs, and other structures, pulmonary vein stenosis, worsening renal function, and death. The patient understands these risk and wishes to proceed.  We Mabelle Mungin therefore proceed with catheter ablation at the next available time.  Carto, ICE, anesthesia are requested for the procedure.  Michaeljames Milnes also obtain CT PV protocol prior to the procedure to exclude LAA thrombus and further evaluate atrial anatomy.   2.  Obstructive sleep apnea: CPAP compliance encouraged  3.  Chronic systolic and diastolic heart failure: Ejection fraction 30 to 35%.  Currently on optimal medical therapy with Toprol-XL 100 mg daily, Entresto 97/103 mg twice daily.  Renessa Wellnitz likely need to discuss ICD therapy post ablation.  Current medicines are reviewed at length with the patient today.   The patient does not have concerns regarding his medicines.  The following changes were made today: None  Labs/ tests ordered today include:  No orders of the defined types were placed in this encounter.     Disposition:   FU with Lua Feng 3 months  Signed, Orchid Glassberg Meredith Leeds, MD  08/08/2021 8:51 AM     [facility] [address] [place] [address] [phone] (office) [phone] (fax)

## 2021-08-09 NOTE — Anesthesia Preprocedure Evaluation (Addendum)
Anesthesia Evaluation  Patient identified by MRN, date of birth, ID band Patient awake    Reviewed: Allergy & Precautions, NPO status , Patient's Chart, lab work & pertinent test results  Airway Mallampati: II  TM Distance: >3 FB Neck ROM: Full    Dental no notable dental hx.    Pulmonary sleep apnea and Continuous Positive Airway Pressure Ventilation , former smoker,    Pulmonary exam normal        Cardiovascular hypertension, Pt. on medications and Pt. on home beta blockers + CAD and +CHF  + dysrhythmias Atrial Fibrillation  Rhythm:Irregular Rate:Normal     Neuro/Psych Anxiety Depression negative neurological ROS     GI/Hepatic Neg liver ROS, GERD  ,  Endo/Other  negative endocrine ROS  Renal/GU negative Renal ROS  negative genitourinary   Musculoskeletal  (+) Arthritis , Osteoarthritis,    Abdominal Normal abdominal exam  (+)   Peds  Hematology negative hematology ROS (+)   Anesthesia Other Findings   Reproductive/Obstetrics                            Anesthesia Physical Anesthesia Plan  ASA: 3  Anesthesia Plan: General   Post-op Pain Management:    Induction: Intravenous  PONV Risk Score and Plan: 2 and Ondansetron, Dexamethasone and Treatment may vary due to age or medical condition  Airway Management Planned: Mask and Oral ETT  Additional Equipment: None  Intra-op Plan:   Post-operative Plan: Extubation in OR  Informed Consent: I have reviewed the patients History and Physical, chart, labs and discussed the procedure including the risks, benefits and alternatives for the proposed anesthesia with the patient or authorized representative who has indicated his/her understanding and acceptance.     Dental advisory given  Plan Discussed with: CRNA  Anesthesia Plan Comments: (Lab Results      Component                Value               Date                      WBC                       5.7                 08/06/2021                HGB                      16.5                08/06/2021                HCT                      48.7                08/06/2021                MCV                      94                  08/06/2021                PLT  195                 08/06/2021           Lab Results      Component                Value               Date                      NA                       140                 08/06/2021                K                        4.4                 08/06/2021                CO2                      28                  08/06/2021                GLUCOSE                  112 (H)             08/06/2021                BUN                      19                  08/06/2021                CREATININE               1.27                08/06/2021                CALCIUM                  9.7                 08/06/2021                EGFR                     61                  08/06/2021                GFRNONAA                 >60                 11/09/2020           ECHO 11/22: 1. Left ventricular ejection fraction, by estimation, is 30%. The left  ventricle has severely decreased function. The left ventricular internal  cavity size was mildly dilated.  2. Right ventricular systolic function is moderately reduced. The right  ventricular size is moderately enlarged.  3. Left atrial size was severely dilated. No left atrial/left atrial  appendage thrombus was detected. The LAA emptying velocity was 18 cm/s.  4. Right atrial size was moderately dilated.  5. 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 Mild mitral valve  regurgitation.  6. The aortic valve is tricuspid. Aortic valve regurgitation is mild.)       Anesthesia Quick Evaluation

## 2021-08-10 ENCOUNTER — Ambulatory Visit (HOSPITAL_COMMUNITY): Payer: PPO | Admitting: Anesthesiology

## 2021-08-10 ENCOUNTER — Encounter (HOSPITAL_COMMUNITY)
Admission: RE | Disposition: A | Discharge status: Home / Self Care | Payer: PPO | Source: Ambulatory Visit | Attending: Cardiology

## 2021-08-10 ENCOUNTER — Ambulatory Visit (HOSPITAL_COMMUNITY)
Admission: RE | Admit: 2021-08-10 | Discharge: 2021-08-10 | Disposition: A | Discharge status: Home / Self Care | Payer: PPO | Source: Ambulatory Visit | Attending: Cardiology | Admitting: Cardiology

## 2021-08-10 ENCOUNTER — Other Ambulatory Visit: Payer: Self-pay

## 2021-08-10 DIAGNOSIS — I5042 Chronic combined systolic (congestive) and diastolic (congestive) heart failure: Secondary | ICD-10-CM | POA: Insufficient documentation

## 2021-08-10 DIAGNOSIS — G4733 Obstructive sleep apnea (adult) (pediatric): Secondary | ICD-10-CM | POA: Diagnosis not present

## 2021-08-10 DIAGNOSIS — Z79899 Other long term (current) drug therapy: Secondary | ICD-10-CM | POA: Insufficient documentation

## 2021-08-10 DIAGNOSIS — I110 Hypertensive heart disease with heart failure: Secondary | ICD-10-CM | POA: Insufficient documentation

## 2021-08-10 DIAGNOSIS — I4819 Other persistent atrial fibrillation: Secondary | ICD-10-CM | POA: Diagnosis not present

## 2021-08-10 DIAGNOSIS — E785 Hyperlipidemia, unspecified: Secondary | ICD-10-CM | POA: Diagnosis not present

## 2021-08-10 DIAGNOSIS — I509 Heart failure, unspecified: Secondary | ICD-10-CM | POA: Diagnosis not present

## 2021-08-10 DIAGNOSIS — Z7901 Long term (current) use of anticoagulants: Secondary | ICD-10-CM | POA: Insufficient documentation

## 2021-08-10 DIAGNOSIS — I4891 Unspecified atrial fibrillation: Secondary | ICD-10-CM | POA: Diagnosis not present

## 2021-08-10 DIAGNOSIS — I2510 Atherosclerotic heart disease of native coronary artery without angina pectoris: Secondary | ICD-10-CM | POA: Diagnosis not present

## 2021-08-10 DIAGNOSIS — I480 Paroxysmal atrial fibrillation: Secondary | ICD-10-CM | POA: Diagnosis present

## 2021-08-10 HISTORY — PX: ATRIAL FIBRILLATION ABLATION: EP1191

## 2021-08-10 LAB — POCT ACTIVATED CLOTTING TIME
Activated Clotting Time: 341 seconds
Activated Clotting Time: 341 seconds
Activated Clotting Time: 378 seconds

## 2021-08-10 SURGERY — ATRIAL FIBRILLATION ABLATION
Anesthesia: General

## 2021-08-10 MED ORDER — HEPARIN (PORCINE) IN NACL 1000-0.9 UT/500ML-% IV SOLN
INTRAVENOUS | Status: AC
  Filled 2021-08-10: qty 500

## 2021-08-10 MED ORDER — COLCHICINE 0.6 MG PO TABS: 0.6000 mg | ORAL_TABLET | Freq: Two times a day (BID) | ORAL | 0 refills | Status: DC

## 2021-08-10 MED ORDER — LIDOCAINE 2% (20 MG/ML) 5 ML SYRINGE
INTRAMUSCULAR | Status: DC | PRN
  Administered 2021-08-10: 80 mg via INTRAVENOUS

## 2021-08-10 MED ORDER — DOBUTAMINE INFUSION FOR EP/ECHO/NUC (1000 MCG/ML)
INTRAVENOUS | Status: DC | PRN
  Administered 2021-08-10 (×2): 20 ug/kg/min via INTRAVENOUS

## 2021-08-10 MED ORDER — ONDANSETRON HCL 4 MG/2ML IJ SOLN
INTRAMUSCULAR | Status: DC | PRN
  Administered 2021-08-10: 4 mg via INTRAVENOUS

## 2021-08-10 MED ORDER — HEPARIN (PORCINE) IN NACL 1000-0.9 UT/500ML-% IV SOLN
INTRAVENOUS | Status: DC | PRN
  Administered 2021-08-10 (×5): 500 mL

## 2021-08-10 MED ORDER — HEPARIN SODIUM (PORCINE) 1000 UNIT/ML IJ SOLN
INTRAMUSCULAR | Status: DC | PRN
  Administered 2021-08-10: 1000 [IU] via INTRAVENOUS

## 2021-08-10 MED ORDER — SODIUM CHLORIDE 0.9 % IV SOLN: 250.0000 mL | INTRAVENOUS | Status: DC | PRN

## 2021-08-10 MED ORDER — PROPOFOL 10 MG/ML IV BOLUS
INTRAVENOUS | Status: DC | PRN
  Administered 2021-08-10: 160 mg via INTRAVENOUS

## 2021-08-10 MED ORDER — ACETAMINOPHEN 325 MG PO TABS
650.0000 mg | ORAL_TABLET | ORAL | Status: DC | PRN
  Filled 2021-08-10: qty 2

## 2021-08-10 MED ORDER — SODIUM CHLORIDE 0.9% FLUSH: 3.0000 mL | INTRAVENOUS | Status: DC | PRN

## 2021-08-10 MED ORDER — HEPARIN SODIUM (PORCINE) 1000 UNIT/ML IJ SOLN
INTRAMUSCULAR | Status: AC
  Filled 2021-08-10: qty 10

## 2021-08-10 MED ORDER — PROTAMINE SULFATE 10 MG/ML IV SOLN
INTRAVENOUS | Status: DC | PRN
  Administered 2021-08-10 (×2): 20 mg via INTRAVENOUS

## 2021-08-10 MED ORDER — PHENYLEPHRINE 40 MCG/ML (10ML) SYRINGE FOR IV PUSH (FOR BLOOD PRESSURE SUPPORT)
PREFILLED_SYRINGE | INTRAVENOUS | Status: DC | PRN
  Administered 2021-08-10: 120 ug via INTRAVENOUS

## 2021-08-10 MED ORDER — HEPARIN SODIUM (PORCINE) 1000 UNIT/ML IJ SOLN
INTRAMUSCULAR | Status: DC | PRN
Start: 2021-08-10 — End: 2021-08-10
  Administered 2021-08-10 (×2): 1000 [IU] via INTRAVENOUS
  Administered 2021-08-10: 14000 [IU] via INTRAVENOUS

## 2021-08-10 MED ORDER — SODIUM CHLORIDE 0.9 % IV SOLN: INTRAVENOUS | Status: DC

## 2021-08-10 MED ORDER — FENTANYL CITRATE (PF) 100 MCG/2ML IJ SOLN
INTRAMUSCULAR | Status: AC
  Filled 2021-08-10: qty 2

## 2021-08-10 MED ORDER — DEXAMETHASONE SODIUM PHOSPHATE 10 MG/ML IJ SOLN
INTRAMUSCULAR | Status: DC | PRN
  Administered 2021-08-10: 4 mg via INTRAVENOUS

## 2021-08-10 MED ORDER — FENTANYL CITRATE (PF) 100 MCG/2ML IJ SOLN
INTRAMUSCULAR | Status: DC | PRN
  Administered 2021-08-10: 100 ug via INTRAVENOUS

## 2021-08-10 MED ORDER — PHENYLEPHRINE HCL-NACL 20-0.9 MG/250ML-% IV SOLN
INTRAVENOUS | Status: DC | PRN
Start: 2021-08-10 — End: 2021-08-10
  Administered 2021-08-10: 25 ug/min via INTRAVENOUS

## 2021-08-10 MED ORDER — EPHEDRINE SULFATE-NACL 50-0.9 MG/10ML-% IV SOSY
PREFILLED_SYRINGE | INTRAVENOUS | Status: DC | PRN
Start: 2021-08-10 — End: 2021-08-10
  Administered 2021-08-10: 5 mg via INTRAVENOUS
  Administered 2021-08-10: 10 mg via INTRAVENOUS

## 2021-08-10 MED ORDER — ROCURONIUM BROMIDE 10 MG/ML (PF) SYRINGE
PREFILLED_SYRINGE | INTRAVENOUS | Status: DC | PRN
  Administered 2021-08-10: 70 mg via INTRAVENOUS

## 2021-08-10 MED ORDER — SUGAMMADEX SODIUM 200 MG/2ML IV SOLN
INTRAVENOUS | Status: DC | PRN
  Administered 2021-08-10: 150 mg via INTRAVENOUS
  Administered 2021-08-10: 50 mg via INTRAVENOUS

## 2021-08-10 MED ORDER — DOBUTAMINE INFUSION FOR EP/ECHO/NUC (1000 MCG/ML)
INTRAVENOUS | Status: AC
  Filled 2021-08-10: qty 250

## 2021-08-10 MED ORDER — COLCHICINE 0.6 MG PO TABS
0.6000 mg | ORAL_TABLET | Freq: Once | ORAL | Status: AC
  Administered 2021-08-10: 0.6 mg via ORAL
  Filled 2021-08-10 (×2): qty 1

## 2021-08-10 MED ORDER — ONDANSETRON HCL 4 MG/2ML IJ SOLN: 4.0000 mg | Freq: Four times a day (QID) | INTRAMUSCULAR | Status: DC | PRN

## 2021-08-10 SURGICAL SUPPLY — 19 items
BAG SNAP BAND KOVER 36X36 (MISCELLANEOUS) ×1 IMPLANT
CATH OCTARAY 2.0 F 3-3-3-3-3 (CATHETERS) ×1 IMPLANT
CATH S CIRCA THERM PROBE 10F (CATHETERS) ×1 IMPLANT
CATH SMTCH THERMOCOOL SF DF (CATHETERS) ×1 IMPLANT
CATH SOUNDSTAR ECO 8FR (CATHETERS) ×1 IMPLANT
CATH WEBSTER BI DIR CS D-F CRV (CATHETERS) ×1 IMPLANT
CLOSURE PERCLOSE PROSTYLE (VASCULAR PRODUCTS) ×4 IMPLANT
COVER SWIFTLINK CONNECTOR (BAG) ×2 IMPLANT
KIT CATH VERSACROSS STEERABLE (CATHETERS) ×1 IMPLANT
PACK EP LATEX FREE (CUSTOM PROCEDURE TRAY) ×2
PACK EP LF (CUSTOM PROCEDURE TRAY) ×1 IMPLANT
PAD DEFIB RADIO PHYSIO CONN (PAD) ×2 IMPLANT
PATCH CARTO3 (PAD) ×1 IMPLANT
SHEATH CARTO VIZIGO SM CVD (SHEATH) ×1 IMPLANT
SHEATH PINNACLE 7F 10CM (SHEATH) ×1 IMPLANT
SHEATH PINNACLE 8F 10CM (SHEATH) ×2 IMPLANT
SHEATH PINNACLE 9F 10CM (SHEATH) ×1 IMPLANT
SHEATH PROBE COVER 6X72 (BAG) ×1 IMPLANT
TUBING SMART ABLATE COOLFLOW (TUBING) ×1 IMPLANT

## 2021-08-10 NOTE — Discharge Instructions (Signed)

## 2021-08-10 NOTE — Interval H&P Note (Signed)
History and Physical Interval Note:  08/10/2021 7:11 AM  Juan Montes  has presented today for surgery, with the diagnosis of afib.  The various methods of treatment have been discussed with the patient and family. After consideration of risks, benefits and other options for treatment, the patient has consented to  Procedure(s): ATRIAL FIBRILLATION ABLATION (N/A) as a surgical intervention.  The patient's history has been reviewed, patient examined, no change in status, stable for surgery.  I have reviewed the patient's chart and labs.  Questions were answered to the patient's satisfaction.     Clevie Prout [employer]

## 2021-08-10 NOTE — Anesthesia Procedure Notes (Signed)
Procedure Name: Intubation Date/Time: 08/10/2021 7:39 AM Performed by: Renato Shin, CRNA Pre-anesthesia Checklist: Patient identified, Emergency Drugs available, Suction available and Patient being monitored Patient Re-evaluated:Patient Re-evaluated prior to induction Oxygen Delivery Method: Circle system utilized Preoxygenation: Pre-oxygenation with 100% oxygen Induction Type: IV induction Ventilation: Mask ventilation without difficulty Laryngoscope Size: Miller and 3 Grade View: Grade II Tube type: Oral Tube size: 7.5 mm Number of attempts: 1 Airway Equipment and Method: Stylet and Oral airway Placement Confirmation: ETT inserted through vocal cords under direct vision, positive ETCO2 and breath sounds checked- equal and bilateral Secured at: 21 cm Tube secured with: Tape Dental Injury: Teeth and Oropharynx as per pre-operative assessment

## 2021-08-10 NOTE — Transfer of Care (Signed)
Immediate Anesthesia Transfer of Care Note  Patient: Juan Montes  Procedure(s) Performed: ATRIAL FIBRILLATION ABLATION  Patient Location: PACU and Cath Lab  Anesthesia Type:General  Level of Consciousness: awake and patient cooperative  Airway & Oxygen Therapy: Patient Spontanous Breathing and Patient connected to nasal cannula oxygen  Post-op Assessment: Report given to RN and Post -op Vital signs reviewed and stable  Post vital signs: Reviewed and stable  Last Vitals:  Vitals Value Taken Time  BP 108/68 08/10/21 1049  Temp    Pulse 78 08/10/21 1052  Resp 20 08/10/21 1052  SpO2 97 % 08/10/21 1052  Vitals shown include unvalidated device data.  Last Pain:  Vitals:   08/10/21 1051  TempSrc:   PainSc: 0-No pain         Complications: There were no known notable events for this encounter.

## 2021-08-11 ENCOUNTER — Telehealth: Payer: Self-pay | Admitting: Physician Assistant

## 2021-08-11 ENCOUNTER — Other Ambulatory Visit: Payer: Self-pay

## 2021-08-11 ENCOUNTER — Observation Stay (HOSPITAL_COMMUNITY)
Admission: EM | Admit: 2021-08-11 | Discharge: 2021-08-12 | Disposition: A | Discharge status: Home / Self Care | Payer: PPO | Attending: Cardiology | Admitting: Cardiology

## 2021-08-11 ENCOUNTER — Emergency Department (HOSPITAL_COMMUNITY): Payer: PPO

## 2021-08-11 ENCOUNTER — Encounter (HOSPITAL_COMMUNITY): Payer: Self-pay | Admitting: Emergency Medicine

## 2021-08-11 ENCOUNTER — Emergency Department (HOSPITAL_BASED_OUTPATIENT_CLINIC_OR_DEPARTMENT_OTHER): Payer: PPO

## 2021-08-11 DIAGNOSIS — Z87891 Personal history of nicotine dependence: Secondary | ICD-10-CM | POA: Insufficient documentation

## 2021-08-11 DIAGNOSIS — R778 Other specified abnormalities of plasma proteins: Secondary | ICD-10-CM | POA: Insufficient documentation

## 2021-08-11 DIAGNOSIS — R0789 Other chest pain: Principal | ICD-10-CM | POA: Insufficient documentation

## 2021-08-11 DIAGNOSIS — R079 Chest pain, unspecified: Secondary | ICD-10-CM

## 2021-08-11 DIAGNOSIS — I5042 Chronic combined systolic (congestive) and diastolic (congestive) heart failure: Secondary | ICD-10-CM | POA: Insufficient documentation

## 2021-08-11 DIAGNOSIS — R072 Precordial pain: Secondary | ICD-10-CM

## 2021-08-11 DIAGNOSIS — I110 Hypertensive heart disease with heart failure: Secondary | ICD-10-CM | POA: Insufficient documentation

## 2021-08-11 DIAGNOSIS — I517 Cardiomegaly: Secondary | ICD-10-CM | POA: Diagnosis not present

## 2021-08-11 DIAGNOSIS — Z79899 Other long term (current) drug therapy: Secondary | ICD-10-CM | POA: Insufficient documentation

## 2021-08-11 DIAGNOSIS — Z20822 Contact with and (suspected) exposure to covid-19: Secondary | ICD-10-CM | POA: Diagnosis not present

## 2021-08-11 DIAGNOSIS — R7989 Other specified abnormal findings of blood chemistry: Secondary | ICD-10-CM

## 2021-08-11 DIAGNOSIS — Z96641 Presence of right artificial hip joint: Secondary | ICD-10-CM | POA: Diagnosis not present

## 2021-08-11 DIAGNOSIS — I2510 Atherosclerotic heart disease of native coronary artery without angina pectoris: Secondary | ICD-10-CM | POA: Diagnosis not present

## 2021-08-11 DIAGNOSIS — I5023 Acute on chronic systolic (congestive) heart failure: Secondary | ICD-10-CM

## 2021-08-11 LAB — BRAIN NATRIURETIC PEPTIDE
B Natriuretic Peptide: 313.4 pg/mL — ABNORMAL HIGH (ref 0.0–100.0)
B Natriuretic Peptide: 471.4 pg/mL — ABNORMAL HIGH (ref 0.0–100.0)

## 2021-08-11 LAB — BASIC METABOLIC PANEL
Anion gap: 5 (ref 5–15)
BUN: 11 mg/dL (ref 8–23)
CO2: 25 mmol/L (ref 22–32)
Calcium: 8.1 mg/dL — ABNORMAL LOW (ref 8.9–10.3)
Chloride: 104 mmol/L (ref 98–111)
Creatinine, Ser: 1.01 mg/dL (ref 0.61–1.24)
GFR, Estimated: >60 mL/min (ref >60)
Glucose, Bld: 134 mg/dL — ABNORMAL HIGH (ref 70–99)
Potassium: 3.7 mmol/L (ref 3.5–5.1)
Sodium: 134 mmol/L — ABNORMAL LOW (ref 135–145)

## 2021-08-11 LAB — ECHOCARDIOGRAM COMPLETE
S' Lateral: 4.6 cm
Single Plane A4C EF: 39.3 %

## 2021-08-11 LAB — RESP PANEL BY RT-PCR (FLU A&B, COVID) ARPGX2
Influenza A by PCR: NEGATIVE
Influenza B by PCR: NEGATIVE
SARS Coronavirus 2 by RT PCR: NEGATIVE

## 2021-08-11 LAB — CBC
HCT: 43.4 % (ref 39.0–52.0)
Hemoglobin: 14.4 g/dL (ref 13.0–17.0)
MCH: 32.1 pg (ref 26.0–34.0)
MCHC: 33.2 g/dL (ref 30.0–36.0)
MCV: 96.7 fL (ref 80.0–100.0)
Platelets: 143 10*3/uL — ABNORMAL LOW (ref 150–400)
RBC: 4.49 MIL/uL (ref 4.22–5.81)
RDW: 13.2 % (ref 11.5–15.5)
WBC: 9 10*3/uL (ref 4.0–10.5)
nRBC: 0 % (ref 0.0–0.2)

## 2021-08-11 LAB — TROPONIN I (HIGH SENSITIVITY)
Troponin I (High Sensitivity): 1506 ng/L (ref <18)
Troponin I (High Sensitivity): 1389 ng/L (ref <18)
Troponin I (High Sensitivity): 1195 ng/L
Troponin I (High Sensitivity): 1171 ng/L (ref <18)

## 2021-08-11 LAB — PROTIME-INR
INR: 1.3 — ABNORMAL HIGH (ref 0.8–1.2)
Prothrombin Time: 16.6 seconds — ABNORMAL HIGH (ref 11.4–15.2)

## 2021-08-11 LAB — D-DIMER, QUANTITATIVE: D-Dimer, Quant: 0.52 ug/mL-FEU — ABNORMAL HIGH (ref 0.00–0.50)

## 2021-08-11 LAB — HIV ANTIBODY (ROUTINE TESTING W REFLEX): HIV Screen 4th Generation wRfx: NONREACTIVE

## 2021-08-11 MED ORDER — COLCHICINE 0.6 MG PO TABS
0.6000 mg | ORAL_TABLET | Freq: Two times a day (BID) | ORAL | Status: DC
  Administered 2021-08-11 – 2021-08-12 (×2): 0.6 mg via ORAL
  Filled 2021-08-11 (×3): qty 1

## 2021-08-11 MED ORDER — SACUBITRIL-VALSARTAN 97-103 MG PO TABS
1.0000 | ORAL_TABLET | Freq: Two times a day (BID) | ORAL | Status: DC
Start: 2021-08-11 — End: 2021-08-12
  Administered 2021-08-11 – 2021-08-12 (×2): 1 via ORAL
  Filled 2021-08-11 (×3): qty 1

## 2021-08-11 MED ORDER — ENOXAPARIN SODIUM 40 MG/0.4ML IJ SOSY: 40.0000 mg | PREFILLED_SYRINGE | INTRAMUSCULAR | Status: DC

## 2021-08-11 MED ORDER — BUPROPION HCL ER (XL) 150 MG PO TB24
150.0000 mg | ORAL_TABLET | Freq: Every day | ORAL | Status: DC
  Administered 2021-08-12: 150 mg via ORAL
  Filled 2021-08-11: qty 1

## 2021-08-11 MED ORDER — NITROGLYCERIN 0.4 MG SL SUBL: 0.4000 mg | SUBLINGUAL_TABLET | SUBLINGUAL | Status: DC | PRN

## 2021-08-11 MED ORDER — ALUM & MAG HYDROXIDE-SIMETH 200-200-20 MG/5ML PO SUSP
30.0000 mL | Freq: Once | ORAL | Status: AC
  Administered 2021-08-11: 30 mL via ORAL
  Filled 2021-08-11: qty 30

## 2021-08-11 MED ORDER — FAMOTIDINE IN NACL 20-0.9 MG/50ML-% IV SOLN
20.0000 mg | Freq: Once | INTRAVENOUS | Status: AC
Start: 2021-08-11 — End: 2021-08-11
  Administered 2021-08-11: 20 mg via INTRAVENOUS
  Filled 2021-08-11: qty 50

## 2021-08-11 MED ORDER — FUROSEMIDE 10 MG/ML IJ SOLN
40.0000 mg | Freq: Once | INTRAMUSCULAR | Status: AC
  Administered 2021-08-11: 40 mg via INTRAVENOUS
  Filled 2021-08-11: qty 4

## 2021-08-11 MED ORDER — ONDANSETRON HCL 4 MG/2ML IJ SOLN
4.0000 mg | Freq: Once | INTRAMUSCULAR | Status: AC
  Administered 2021-08-11: 4 mg via INTRAVENOUS
  Filled 2021-08-11: qty 2

## 2021-08-11 MED ORDER — ALPRAZOLAM 0.25 MG PO TABS: 0.2500 mg | ORAL_TABLET | Freq: Two times a day (BID) | ORAL | Status: DC | PRN

## 2021-08-11 MED ORDER — MORPHINE SULFATE (PF) 4 MG/ML IV SOLN
4.0000 mg | Freq: Once | INTRAVENOUS | Status: AC
  Administered 2021-08-11: 4 mg via INTRAVENOUS
  Filled 2021-08-11: qty 1

## 2021-08-11 MED ORDER — EZETIMIBE 10 MG PO TABS
10.0000 mg | ORAL_TABLET | Freq: Every day | ORAL | Status: DC
  Administered 2021-08-12: 10 mg via ORAL
  Filled 2021-08-11: qty 1

## 2021-08-11 MED ORDER — TRAMADOL HCL 50 MG PO TABS
50.0000 mg | ORAL_TABLET | Freq: Four times a day (QID) | ORAL | Status: DC | PRN
  Administered 2021-08-11 – 2021-08-12 (×2): 100 mg via ORAL
  Filled 2021-08-11 (×2): qty 2

## 2021-08-11 MED ORDER — SODIUM CHLORIDE 0.9% FLUSH
3.0000 mL | Freq: Two times a day (BID) | INTRAVENOUS | Status: DC
  Administered 2021-08-11 – 2021-08-12 (×2): 3 mL via INTRAVENOUS

## 2021-08-11 MED ORDER — ASPIRIN 325 MG PO TABS
325.0000 mg | ORAL_TABLET | Freq: Every day | ORAL | Status: DC
  Administered 2021-08-11 – 2021-08-12 (×2): 325 mg via ORAL
  Filled 2021-08-11 (×2): qty 1

## 2021-08-11 MED ORDER — SODIUM CHLORIDE 0.9% FLUSH: 3.0000 mL | INTRAVENOUS | Status: DC | PRN

## 2021-08-11 MED ORDER — SODIUM CHLORIDE 0.9 % IV SOLN: 250.0000 mL | INTRAVENOUS | Status: DC | PRN

## 2021-08-11 MED ORDER — METOPROLOL SUCCINATE ER 100 MG PO TB24
100.0000 mg | ORAL_TABLET | Freq: Every day | ORAL | Status: DC
  Administered 2021-08-12: 100 mg via ORAL
  Filled 2021-08-11: qty 1

## 2021-08-11 MED ORDER — ONDANSETRON HCL 4 MG/2ML IJ SOLN: 4.0000 mg | Freq: Four times a day (QID) | INTRAMUSCULAR | Status: DC | PRN

## 2021-08-11 MED ORDER — AMLODIPINE BESYLATE 5 MG PO TABS
5.0000 mg | ORAL_TABLET | Freq: Every day | ORAL | Status: DC
  Administered 2021-08-12: 5 mg via ORAL
  Filled 2021-08-11: qty 1

## 2021-08-11 MED ORDER — LIDOCAINE VISCOUS HCL 2 % MT SOLN
15.0000 mL | Freq: Once | OROMUCOSAL | Status: AC
Start: 2021-08-11 — End: 2021-08-11
  Administered 2021-08-11: 15 mL via ORAL
  Filled 2021-08-11: qty 15

## 2021-08-11 MED ORDER — SERTRALINE HCL 100 MG PO TABS
100.0000 mg | ORAL_TABLET | Freq: Every day | ORAL | Status: DC
Start: 2021-08-12 — End: 2021-08-12
  Administered 2021-08-12: 100 mg via ORAL
  Filled 2021-08-11: qty 1

## 2021-08-11 MED ORDER — RIVAROXABAN 10 MG PO TABS
20.0000 mg | ORAL_TABLET | Freq: Every day | ORAL | Status: DC
  Administered 2021-08-11: 20 mg via ORAL
  Filled 2021-08-11: qty 1

## 2021-08-11 MED ORDER — ZOLPIDEM TARTRATE 5 MG PO TABS
5.0000 mg | ORAL_TABLET | Freq: Every evening | ORAL | Status: DC | PRN
  Administered 2021-08-12: 5 mg via ORAL
  Filled 2021-08-11: qty 1

## 2021-08-11 MED ORDER — ACETAMINOPHEN 325 MG PO TABS: 650.0000 mg | ORAL_TABLET | ORAL | Status: DC | PRN

## 2021-08-11 NOTE — ED Provider Notes (Signed)
[name]ENC[facility] DEPART[facility] Provider Note   CSN: [id number] Arrival date & time: 08/11/21  1213     History  Chief Complaint  Patient presents with   Chest Pain    Juan Montes is a 71 y.o. male.  This is a 71 y.o. male with significant medical history as below, including afib s/p abl;ation 1/13 Dr Curt Bears, HLD, HTN  who presents to the ED with complaint of chest pain.  Patient reports that he woke up to sudden onset chest discomfort earlier this morning.  Pain has been progressive since the onset.  He took Tylenol x1 which did not significant previous symptoms.  Pain worsened with deep inspiration, ambulation.  Pain is gradually worsened since the onset.  Pain is midsternal, does not radiate.  Described as sharp, aching, pressure sensation.  Mild nausea without vomiting.  No lightheadedness.  No syncope, no diaphoresis.  No fevers or chills.  No change to daily medications or oral intake.  No change to bowel or bladder function.  Family attempted to contact cardiology office, on-call provider instructed him to come to the ER for evaluation per pt.   Pt feels like perhaps he has "fluid on my lungs."   The history is provided by the patient and a relative. No language interpreter was used.  Chest Pain Associated symptoms: shortness of breath   Associated symptoms: no abdominal pain, no cough, no dysphagia, no fever, no headache, no nausea, no palpitations and no vomiting    HPI: A 71 year old patient with a history of hypertension presents for evaluation of chest pain. Initial onset of pain was less than one hour ago. The patient's chest pain is described as heaviness/pressure/tightness and is not worse with exertion. The patient's chest pain is middle- or left-sided, is not well-localized, is not sharp and does not radiate to the arms/jaw/neck. The patient does not complain of nausea and denies diaphoresis. The patient has no history of stroke, has no history of  peripheral artery disease, has not smoked in the past 90 days, denies any history of treated diabetes, has no relevant family history of coronary artery disease (first degree relative at less than age 70), has no history of hypercholesterolemia and does not have an elevated BMI (>=30).  Patient Active Problem List   Diagnosis Date Noted   Chest pain of uncertain etiology 52/77/8242   Chest pain 08/11/2021   Thrombus of left atrial appendage    Statin intolerance 03/20/2021   Pure hypercholesterolemia 03/20/2021   Nonocclusive coronary atherosclerosis of native coronary artery 03/20/2021   OSA (obstructive sleep apnea) 03/20/2021   Statin myopathy 01/30/2021   Persistent atrial fibrillation ([place]) 11/09/2020   OA (osteoarthritis) of hip 06/14/2020   Primary osteoarthritis of right hip 06/14/2020   Secondary hypercoagulable state ([place]) 08/24/2019   Snores 03/30/2019   NICM (nonischemic cardiomyopathy) ([place]) 03/30/2019   Chronic combined systolic and diastolic heart failure ([place]) 08/27/2018   Abnormal nuclear stress test 04/15/2018   Atypical angina ([place]) 04/15/2018   Dyspnea 10/19/2013   Paroxysmal atrial fibrillation ([place]) 09/20/2013   Screening for gout 09/20/2013   Cough 08/09/2013   Dizziness 10/16/2011   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   Dermatologic disease 09/02/2011   Influenza-like illness 08/16/2011   Hand discomfort 11/16/2010   Anxiety disorder 12/19/2009   Blood in feces 12/19/2009   Clinical depression 12/19/2009   Encounter for long-term (current) use of other medications 12/19/2009   Fatigue 12/19/2009   Other and unspecified  general psychiatric examination 12/19/2009   HLD (hyperlipidemia) 12/19/2009   Essential hypertension 12/19/2009   Elevated fasting blood sugar 12/19/2009   Arthritis of knee, degenerative 12/19/2009     Home Medications Prior to Admission medications   Medication Sig Start Date End Date Taking? Authorizing Provider   amLODipine (NORVASC) 5 MG tablet TAKE 1 TABLET(5 MG) BY MOUTH DAILY Patient taking differently: Take 5 mg by mouth daily. 07/13/21  Yes Buford Dresser, MD  buPROPion (WELLBUTRIN XL) 150 MG 24 hr tablet Take 150 mg by mouth daily. 08/07/20  Yes [provider]  colchicine 0.6 MG tablet Take 1 tablet (0.6 mg total) by mouth 2 (two) times daily. 08/10/21 08/10/22 Yes Camnitz, Will Hassell Done, MD  ezetimibe (ZETIA) 10 MG tablet Take 10 mg by mouth daily. 06/09/19  Yes [provider]  metoprolol succinate (TOPROL-XL) 100 MG 24 hr tablet TAKE 1 TABLET BY MOUTH DAILY WITH OR IMMEDIATELY FOLLOWING A MEAL Patient taking differently: 100 mg daily. 07/02/21  Yes Buford Dresser, MD  rivaroxaban (XARELTO) 20 MG TABS tablet Take 1 tablet (20 mg total) by mouth daily with supper. 04/26/21  Yes Camnitz, Will Hassell Done, MD  sacubitril-valsartan (ENTRESTO) 97-103 MG Take 1 tablet by mouth 2 (two) times daily. 05/29/21  Yes Buford Dresser, MD  sertraline (ZOLOFT) 100 MG tablet Take 100 mg by mouth daily.  08/08/10  Yes [provider]      Allergies    Apixaban, Atorvastatin, Celecoxib, and Pravastatin    Review of Systems   Review of Systems  Constitutional:  Negative for chills and fever.  HENT:  Negative for facial swelling and trouble swallowing.   Eyes:  Negative for photophobia and visual disturbance.  Respiratory:  Positive for shortness of breath. Negative for cough.   Cardiovascular:  Positive for chest pain. Negative for palpitations.  Gastrointestinal:  Negative for abdominal pain, nausea and vomiting.  Endocrine: Negative for polydipsia and polyuria.  Genitourinary:  Negative for difficulty urinating and hematuria.  Musculoskeletal:  Negative for gait problem and joint swelling.  Skin:  Negative for pallor and rash.  Neurological:  Negative for syncope and headaches.  Psychiatric/Behavioral:  Negative for agitation and confusion.    Physical  Exam Updated Vital Signs BP 113/83    Pulse 99    Temp 99 F (37.2 C) (Oral)    Resp 20    SpO2 92%  Physical Exam Vitals and nursing note reviewed.  Constitutional:      General: He is in acute distress.     Appearance: He is well-developed.     Comments: Acute discomfort   HENT:     Head: Normocephalic and atraumatic.     Right Ear: External ear normal.     Left Ear: External ear normal.     Mouth/Throat:     Mouth: Mucous membranes are moist.  Eyes:     General: No scleral icterus. Cardiovascular:     Rate and Rhythm: Regular rhythm. Tachycardia present.     Pulses: Normal pulses.     Heart sounds: Normal heart sounds. No murmur heard. Pulmonary:     Effort: Pulmonary effort is normal. No respiratory distress.     Breath sounds: Normal breath sounds.  Abdominal:     General: Abdomen is flat. There is no distension.     Palpations: Abdomen is soft.     Tenderness: There is no abdominal tenderness.  Musculoskeletal:        General: Normal range of motion.  Cervical back: Normal range of motion.     Right lower leg: No edema.     Left lower leg: No edema.  Skin:    General: Skin is warm and dry.     Capillary Refill: Capillary refill takes less than 2 seconds.  Neurological:     Mental Status: He is alert and oriented to person, place, and time.     GCS: GCS eye subscore is 4. GCS verbal subscore is 5. GCS motor subscore is 6.  Psychiatric:        Mood and Affect: Mood normal.        Behavior: Behavior normal.    ED Results / Procedures / Treatments   Labs (all labs ordered are listed, but only abnormal results are displayed) Labs Reviewed  BASIC METABOLIC PANEL - Abnormal; Notable for the following components:      Result Value   Sodium 134 (*)    Glucose, Bld 134 (*)    Calcium 8.1 (*)    All other components within normal limits  CBC - Abnormal; Notable for the following components:   Platelets 143 (*)    All other components within normal limits   D-DIMER, QUANTITATIVE - Abnormal; Notable for the following components:   D-Dimer, Quant 0.52 (*)    All other components within normal limits  BRAIN NATRIURETIC PEPTIDE - Abnormal; Notable for the following components:   B Natriuretic Peptide 313.4 (*)    All other components within normal limits  PROTIME-INR - Abnormal; Notable for the following components:   Prothrombin Time 16.6 (*)    INR 1.3 (*)    All other components within normal limits  TROPONIN I (HIGH SENSITIVITY) - Abnormal; Notable for the following components:   Troponin I (High Sensitivity) 1,506 (*)    All other components within normal limits  TROPONIN I (HIGH SENSITIVITY) - Abnormal; Notable for the following components:   Troponin I (High Sensitivity) 1,389 (*)    All other components within normal limits  RESP PANEL BY RT-PCR (FLU A&B, COVID) ARPGX2  HIV ANTIBODY (ROUTINE TESTING W REFLEX)  COMPREHENSIVE METABOLIC PANEL  BRAIN NATRIURETIC PEPTIDE  TROPONIN I (HIGH SENSITIVITY)    EKG EKG Interpretation  Date/Time:  Saturday August 11 2021 12:26:04 EST Ventricular Rate:  98 PR Interval:  188 QRS Duration: 86 QT Interval:  382 QTC Calculation: 487 R Axis:   69 Text Interpretation: Normal sinus rhythm Prolonged QT Abnormal ECG When compared with ECG of 10-Aug-2021 11:11, PREVIOUS ECG IS PRESENT Confirmed by Wynona Dove ([id number]) on 08/11/2021 1:51:05 PM  Radiology DG Chest 2 View  Result Date: 08/11/2021 CLINICAL DATA:  71 year old male with history of chest pain. EXAM: CHEST - 2 VIEW COMPARISON:  Chest x-ray 03/04/2007. FINDINGS: Lung volumes are slightly low. There is cephalization of the pulmonary vasculature and slight indistinctness of the interstitial markings suggestive of mild pulmonary edema. No pleural effusions. Mild cardiomegaly with left atrial dilatation. Upper mediastinal contours are within normal limits. IMPRESSION: 1. The appearance the chest suggests very mild congestive heart failure.  Electronically Signed   By: Vinnie Langton M.D.   On: 08/11/2021 12:55   EP STUDY  Result Date: 08/10/2021 SURGEON:  Allegra Lai, MD PREPROCEDURE DIAGNOSES: 1. Persistent atrial fibrillation. POSTPROCEDURE DIAGNOSES: 1. Persistent atrial fibrillation. PROCEDURES: 1. Comprehensive electrophysiologic study. 2. Coronary sinus pacing and recording. 3. Three-dimensional mapping of atrial fibrillation (with additional mapping and ablation within the left atrium due to persistence of afib) 4. Ablation of atrial fibrillation (with  additional mapping and ablation within the left atrium due to persistence of afib) 5. Intracardiac echocardiography. 6. Transseptal puncture of an intact septum. 7. Arrhythmia induction with pacing 8. External cardioversion. INTRODUCTION:  Juan Montes is a 71 y.o. male with a history of persistent atrial fibrillation who now presents for EP study and radiofrequency ablation.  The patient reports initially being diagnosed with atrial fibrillation after presenting with symptomatic palpitations and fatgiue.  The patient has failed medical therapy.  The patient therefore presents today for catheter ablation of atrial fibrillation. DESCRIPTION OF PROCEDURE:  Informed written consent was obtained, and the patient was brought to the electrophysiology lab in a fasting state.  The patient was adequately sedated with intravenous medications as outlined in the anesthesia report.  The patient's left and right groins were prepped and draped in the usual sterile fashion by the EP lab staff.  Using a percutaneous Seldinger technique, two 7-French and one 11-French hemostasis sheaths were placed into the right common femoral vein.  An esophageal temperature probe was inserted to monitor for heating of the esophagus during the procedure. Direct ultrasound guidance is used for right and left femoral veins with normal vessel patency. Ultrasound images are captured and stored in the patient's chart. Using  ultrasound guidance, the Brockenbrough needle and wire were visualized entering the vessel. Catheter Placement:  A 7-French Biosense Webster Decapolar coronary sinus catheter was introduced through the right common femoral vein and advanced into the coronary sinus for recording and pacing from this location.    A luminal esophageal temperature probe was placed and used for continuous monitoring of the luminal esophageal temperature throughout the procedure as well as to localize the esophagus on fluoroscopy. In addition, the esophagus was directly visualized with intracardiac echo and its positioned marked on Carto.  During ablation at the posterior [place] there was limited esophageal heating noted during RF energy delivery with the maximal temperature recorded by the luminal temperature probe of < 38.5 degrees C. Initial Measurements: The patient presented to the electrophysiology lab in atrial fibrillation.   The average RR interval measured 652 msec.   Intracardiac Echocardiography: A 10-French Biosense Webster AcuNav intracardiac echocardiography catheter was introduced through the right common femoral vein and advanced into the right atrium. Intracardiac echocardiography was performed of the left atrium, and a three-dimensional anatomical rendering of the left atrium was performed using CARTO sound technology.  The patient was noted to have a moderate sized left atrium.  The interatrial septum was prominent but not aneurysmal. All 4 pulmonary veins were visualized and noted to have separate ostia.  The pulmonary veins were moderate in size.  The left atrial appendage was visualized and did not reveal thrombus.   There was no evidence of pulmonary vein stenosis. Transseptal Puncture: The right common femoral vein sheaths were was exchanged for an 8.5 [place] Agillis transseptal sheath and transseptal access was achieved in a standard fashion using a Brockenbrough needle under biplane fluoroscopy with intracardiac  echocardiography confirmation of the transseptal puncture.  Once transseptal access had been achieved, heparin was administered intravenously and intra- arterially in order to maintain an ACT of greater than 350 seconds throughout the procedure.  3D Mapping and Ablation: A 3.5 mm Biosense [employer] Thermocool ablation catheter was advanced into the right atrium.  The transseptal sheath was pulled back into the IVC over a guidewire.  The ablation catheter was advanced across the transseptal hole using the wire as a guide.  The transseptal sheath was then  re-advanced over the guidewire into the left atrium.  A duodecapolar Biosense Webster pentarray mapping catheter was introduced through the transseptal sheath and positioned over the mouth of all 4 pulmonary veins.  Three-dimensional electroanatomical mapping was performed using CARTO technology.  This demonstrated electrical activity within all four pulmonary veins at baseline. The patient underwent successful sequential electrical isolation and anatomical encircling of all four pulmonary veins using radiofrequency current with a circular mapping catheter as a guide. A WACA approach was used. Due to persistence of atrial fibrillation, additional left atrial mapping and ablation was performed.  A series of radiofrequency lesions were delivered along the roof and floor of the left atrium in order to create a "standard box" lesion along the posterior [place] of the left atrium. The ablation catheter was then pulled back into the right atrial and positioned along the cavo-tricuspid isthmus.  3D Mapping along the atrial side of the isthmus was performed.  This demonstrated a standard isthmus.  A series of radiofrequency applications were then delivered along the isthmus at 30-40 Watts with a target index of 450-500.   Complete bidirectional cavotricuspid isthmus block was achieved as confirmed by differential atrial pacing from the low lateral right atrium.  A  stimulus to earliest atrial activation across the isthmus measured 150 msec bi-directionally.  The patient was observe without return of conduction through the isthmus. 20 mcg/kg/min dobutamine was infused without arrhythmia induced. Cardioversion: The patient was then cardioverted to sinus rhythm with a single synchronized 150-J biphasic shock with cardioversion electrodes in the anterior-posterior thoracic configuration. he remained in sinus rhythm thereafter. Measurements Following Ablation: In sinus rhythm the RR interval was 864msec, with PR 187 msec, QRS 107 msec, and QT 454 msec.  Following ablation the AH interval measured 94 msec with an HV interval of 56 msec. Ventricular pacing was performed, which revealed midline decremental VA conduction with a VA Wenckebach cycle length of 660 msec. Rapid atrial pacing was performed, which revealed an AV Wenckebach cycle length of 460 msec.  Electroisolation was then again confirmed in all four pulmonary veins. The procedure was therefore considered completed.  All catheters were removed, and the sheaths were aspirated and flushed.  The patient was transferred to the recovery area for sheath removal per protocol.  Intracardiac echocardiogram revealed no pericardial effusion. EBL<57ml.  There were no early apparent complications. CONCLUSIONS: 1. Atrial fibrillation upon presentation.  2. Successful electrical isolation and anatomical encircling of all four pulmonary veins with radiofrequency current.  A WACA approach was used 3. Additional left atrial ablation was performed with a standard box lesion created along the posterior [place] of the left atrium 4. Atrial fibrillation successfully cardioverted to sinus rhythm. 5. No early apparent complications. Will Martin Camnitz,MD 10:35 AM 08/10/2021  ECHOCARDIOGRAM COMPLETE  Result Date: 08/11/2021    ECHOCARDIOGRAM REPORT   Patient Name:   Juan Montes Date of Exam: 08/11/2021 Medical Rec #:  [id number]     Height:        67.0 in Accession #:    [id number]    Weight:       195.0 lb Date of Birth:  November 25, 1950     BSA:          2.001 m Patient Age:    87 years      BP:           133/97 mmHg Patient Gender: M             HR:  78 bpm. Exam Location:  Inpatient Procedure: 2D Echo STAT ECHO Indications:    chest pain  History:        Patient has prior history of Echocardiogram examinations, most                 recent 06/04/2021. Cardiomyopathy, ablation; Risk Factors:Sleep                 Apnea, Dyslipidemia and Hypertension.  Sonographer:    Johny Chess RDCS Referring Phys: [id number] [place]  1. Left ventricular ejection fraction, by estimation, is 30 to 35%. The left ventricle has moderately decreased function. The left ventricle demonstrates global hypokinesis. There is mild concentric left ventricular hypertrophy. Indeterminate diastolic filling due to E-A fusion.  2. Right ventricular systolic function is normal. The right ventricular size is normal. Tricuspid regurgitation signal is inadequate for assessing PA pressure.  3. Left atrial size was severely dilated.  4. The pericardial effusion is surrounding the apex but trivial.  5. The mitral valve is abnormal. 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 This study does not clearly show mitral annular disjunction. No evidence of mitral valve regurgitation.  6. The aortic valve was not well visualized. Aortic valve regurgitation is mild. No aortic stenosis is present. Comparison(s): No significant change from prior study. FINDINGS  Left Ventricle: Left ventricular ejection fraction, by estimation, is 30 to 35%. The left ventricle has moderately decreased function. The left ventricle demonstrates global hypokinesis. The left ventricular internal cavity size was normal in size. There is mild concentric left ventricular hypertrophy. Indeterminate diastolic filling due to E-A fusion. Right Ventricle: The right ventricular  size is normal. No increase in right ventricular [place] thickness. Right ventricular systolic function is normal. Tricuspid regurgitation signal is inadequate for assessing PA pressure. Left Atrium: Left atrial size was severely dilated. Right Atrium: Right atrial size was normal in size. Pericardium: Trivial pericardial effusion is present. The pericardial effusion is surrounding the apex. Mitral Valve: The mitral valve is abnormal. No evidence of mitral valve regurgitation. Tricuspid Valve: The tricuspid valve is normal in structure. Tricuspid valve regurgitation is not demonstrated. No evidence of tricuspid stenosis. Aortic Valve: The aortic valve was not well visualized. Aortic valve regurgitation is mild. No aortic stenosis is present. Pulmonic Valve: The pulmonic valve was grossly normal. Pulmonic valve regurgitation is trivial. No evidence of pulmonic stenosis. Aorta: The aortic root and ascending aorta are structurally normal, with no evidence of dilitation. IAS/Shunts: No atrial level shunt detected by color flow Doppler.  LEFT VENTRICLE PLAX 2D LVIDd:         5.70 cm     Diastology LVIDs:         4.60 cm     LV e' medial:  7.62 cm/s LV PW:         1.10 cm     LV e' lateral: 8.38 cm/s LV IVS:        1.20 cm LVOT diam:     2.60 cm LV SV:         58 LV SV Index:   29 LVOT Area:     5.31 cm  LV Volumes (MOD) LV vol d, MOD A4C: 99.8 ml LV vol s, MOD A4C: 60.6 ml LV SV MOD A4C:     99.8 ml RIGHT VENTRICLE             IVC RV S prime:     12.10 cm/s  IVC diam:  3.80 cm TAPSE (M-mode): 1.6 cm LEFT ATRIUM              Index        RIGHT ATRIUM           Index LA diam:        4.50 cm  2.25 cm/m   RA Area:     21.40 cm LA Vol (A2C):   104.0 ml 51.98 ml/m  RA Volume:   62.00 ml  30.99 ml/m LA Vol (A4C):   92.6 ml  46.29 ml/m LA Biplane Vol: 104.0 ml 51.98 ml/m  AORTIC VALVE LVOT Vmax:   63.80 cm/s LVOT Vmean:  40.700 cm/s LVOT VTI:    0.110 m  AORTA Ao Root diam: 3.50 cm Ao Asc diam:  3.20 cm  SHUNTS Systemic VTI:   0.11 m Systemic Diam: 2.60 cm Rudean Haskell MD Electronically signed by Rudean Haskell MD Signature Date/Time: 08/11/2021/3:56:14 PM    Final     Procedures .Critical Care Performed by: Jeanell Sparrow, DO Authorized by: Jeanell Sparrow, DO   Critical care provider statement:    Critical care time (minutes):  42   Critical care time was exclusive of:  Separately billable procedures and treating other patients   Critical care was necessary to treat or prevent imminent or life-threatening deterioration of the following conditions:  Cardiac failure   Critical care was time spent personally by me on the following activities:  Development of treatment plan with patient or surrogate, discussions with consultants, evaluation of patient's response to treatment, examination of patient, ordering and review of laboratory studies, ordering and review of radiographic studies, ordering and performing treatments and interventions, pulse oximetry, re-evaluation of patient's condition and review of old charts    Medications Ordered in ED Medications  aspirin tablet 325 mg (325 mg Oral Given 08/11/21 1417)  nitroGLYCERIN (NITROSTAT) SL tablet 0.4 mg (has no administration in time range)  acetaminophen (TYLENOL) tablet 650 mg (has no administration in time range)  ondansetron (ZOFRAN) injection 4 mg (has no administration in time range)  zolpidem (AMBIEN) tablet 5 mg (has no administration in time range)  sodium chloride flush (NS) 0.9 % injection 3 mL (has no administration in time range)  sodium chloride flush (NS) 0.9 % injection 3 mL (has no administration in time range)  0.9 %  sodium chloride infusion (has no administration in time range)  ALPRAZolam (XANAX) tablet 0.25 mg (has no administration in time range)  amLODipine (NORVASC) tablet 5 mg (has no administration in time range)  buPROPion (WELLBUTRIN XL) 24 hr tablet 150 mg (has no administration in time range)  colchicine tablet 0.6 mg  (has no administration in time range)  ezetimibe (ZETIA) tablet 10 mg (has no administration in time range)  metoprolol succinate (TOPROL-XL) 24 hr tablet 100 mg (has no administration in time range)  rivaroxaban (XARELTO) tablet 20 mg (has no administration in time range)  sacubitril-valsartan (ENTRESTO) 97-103 mg per tablet (has no administration in time range)  sertraline (ZOLOFT) tablet 100 mg (has no administration in time range)  traMADol (ULTRAM) tablet 50-100 mg (100 mg Oral Given 08/11/21 1750)  morphine 4 MG/ML injection 4 mg (4 mg Intravenous Given 08/11/21 1411)  ondansetron (ZOFRAN) injection 4 mg (4 mg Intravenous Given 08/11/21 1411)  famotidine (PEPCID) IVPB 20 mg premix (0 mg Intravenous Stopped 08/11/21 1503)  alum & mag hydroxide-simeth (MAALOX/MYLANTA) 200-200-20 MG/5ML suspension 30 mL (30 mLs Oral Given 08/11/21 1415)    And  lidocaine (XYLOCAINE) 2 % viscous mouth solution 15 mL (15 mLs Oral Given 08/11/21 1415)  furosemide (LASIX) injection 40 mg (40 mg Intravenous Given 08/11/21 1751)    ED Course/ Medical Decision Making/ A&P   HEAR Score: 5                       Medical Decision Making   CC: Chest pain  This patient complains of chest pain; this involves an extensive number of treatment options and is a complaint that carries with it a high risk of complications and morbidity. Vital signs were reviewed. Serious etiologies considered.  Record review:  Previous records obtained and reviewed   Additional history obtained from spouse  Work up as above, notable for:  Labs & imaging results that were available during my care of the patient were reviewed by me and considered in my medical decision making.   I ordered imaging studies which included chest x-ray and I independently visualized and interpreted imaging which showed mild chf.  Initial troponin elevated greater than 1500.  EKG normal sinus rhythm.  Cardiac monitor reviewed by myself demonstrates sinus  tachycardia.  Pt with pleuritic cp, recent procedure, low risk well's score, age adjusted d-dimer is not elevated, PE is unlikely.  Will give patient aspirin.  Discussed with cardiology given ablation yesterday Dr. Curt Bears. Spoke with Dr Percival Spanish.  Cardiology to come and eval, stat echo. Hold heparin. R/o micro perf  Stat echocardiogram was completed, echocardiogram is similar to his baseline.  HFrEF ejection fraction around 30%  Discussed with Dr. Percival Spanish who has evaluated the patient.    Elevated troponin and chest pain likely due to a recent ablation procedure.  Troponin is downtrending.  EKG stable.  Cardiac monitoring reviewed by myself demonstrates normal sinus rhythm.  Management: Patient given morphine, Zofran, aspirin.  Reassessment:  Patient reports feeling better. VSS   Discussed with cardiologist Dr. Percival Spanish, ECHO was reviewed from today. Small pericardial effusion new since prior echo. Recommend admission at this time. Pt agreeable. Will admit to cardiology service.      This chart was dictated using voice recognition software.  Despite best efforts to proofread,  errors can occur which can change the documentation meaning.         Final Clinical Impression(s) / ED Diagnoses Final diagnoses:  Chest pain, unspecified type  Elevated troponin    Rx / DC Orders ED Discharge Orders     None         Jeanell Sparrow, DO 08/11/21 2011

## 2021-08-11 NOTE — ED Notes (Signed)
Pt ambulatory to the bathroom and back to stretcher without assistance. No acute changes noted. Will continue to monitor.

## 2021-08-11 NOTE — ED Notes (Signed)
Wife Juan Montes [phone] would like an update before she goes to bed

## 2021-08-11 NOTE — Telephone Encounter (Signed)
Pt wife called because he is having all over chest pain, worse w/ deep inspiration.   He had been coughing some before the ablation, but last night, he coughed all night.   He took Delsym, with some relief.  He had some clear phlegm, not much.   No SOB at baseline. No LE edema.  Ok to take Delsym scheduled all day, extra dose at night.  Take Tylenol for the pain.  If infection >> will have not clear sputum and fever If CHF, will have weight gain and DOE  If he does not improve call me for CHF and PCP for infection.  Ok to take Benadryl at bedtime  Rosaria Ferries, PA-C 08/11/2021 11:12 AM

## 2021-08-11 NOTE — ED Notes (Signed)
Patient transported to CT 

## 2021-08-11 NOTE — Progress Notes (Signed)
°  Echocardiogram 2D Echocardiogram has been performed.  Juan Montes 08/11/2021, 3:25 PM

## 2021-08-11 NOTE — ED Triage Notes (Signed)
Pt had an ablation yesterday.  Woke up with dull pain to center of chest and SOB.  States he was concerned about elevated BP, HR, and weight.  Pt anxious.  Reports BP 140/91 at home.  HR 100 and 5 lb weight gain since yesterday.

## 2021-08-11 NOTE — ED Notes (Signed)
Critical troponin reported to Pearline Cables MD

## 2021-08-11 NOTE — H&P (Signed)
Cardiology Admission History and Physical:   Patient ID: TULIO FACUNDO MRN: [id number]; DOB: Jan 14, 1951   Admission date: 08/11/2021  PCP:  Crist Infante, MD   [facility] Providers Cardiologist:  Buford Dresser, MD  Electrophysiologist:  Will Meredith Leeds, MD       Chief Complaint:  Chest pain  Patient Profile:   Juan Montes is a 71 y.o. male with A. fib s/p ablation 08/10/2021, combined CHF, nonobstructive CAD, HTN, HLD, OSA, depression, who is being seen 08/11/2021 for the evaluation of chest pain.  History of Present Illness:   Juan Montes was in atrial fibrillation.  His weight was stable and his volume status was good.  He came to the hospital for an ablation on 08/10/2021.  He tolerated the procedure well, and was discharged home.  After discharge, the cough that he had had on a minor level prior to his procedure, got much worse.  He stated he coughed all night long.  He took Delsym with some relief.  He had some clear phlegm but not much.  He had no lower extremity edema.  When he was not coughing, he was not short of breath.  He was not having any fevers or chills.  His ablation site had no signs of infection.  He had had some chest pain, but the chest pain got much worse after coughing all night.  He stated the pain increased with deep inspiration or cough.  He stated it hurt all over his chest.  He has not had this pain before.  He has not tried any medications for it.  He was initially advised to watch for weight gain and take Tylenol as well as cough medicine.  When he weighed himself, his weight was 198 which is up about 5 pounds from normal for him.  The pain was also getting worse and he did not think he could tolerate it overnight.  He came to the ER.    Past Medical History:  Diagnosis Date   A-fib ([place]) 09/20/2013   Anxiety disorder 12/19/2009   Arthritis of knee, degenerative 12/19/2009   BP (high blood pressure) 12/19/2009   Chronic combined  systolic and diastolic CHF (congestive heart failure) ([place])    a. MV/LHC 2019 EF 30-45%; b.Echo 2020 EF 30-35%, mod red LVSF, mod LAE, mild aortic root dialtion, 40 mm; c.TEE 9/22 EF 20-25%, red LVF, LAA thrombus, severe LAE, mild MR/AR, mild plaque des aorta; d. TEE 10/22 EF 30%, dec LVF, RVSF, sev LAE, mod RAE, no evidence of LAA thrombus, no aortic root dilation.   Clinical depression 12/19/2009   Coronary artery disease, non-occlusive    a. abnormal MV 2019, EF 30-43%; b. LHC 03/2018 Lat Ramus lesion is 40% stenosed. Otherwise minimal CAD, EF 45%.   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   Elevated fasting blood sugar 12/19/2009   GERD (gastroesophageal reflux disease)    HLD (hyperlipidemia) 12/19/2009   HTN (hypertension)    Sleep apnea     Past Surgical History:  Procedure Laterality Date   HERNIA REPAIR     KNEE SURGERY Left    LEFT HEART CATH AND CORONARY ANGIOGRAPHY N/A 04/15/2018   Procedure: LEFT HEART CATH AND CORONARY ANGIOGRAPHY;  Surgeon: Leonie Man, MD;  Location: [place] [facility];  Service: Cardiovascular;  Laterality: N/A;   TEE WITHOUT CARDIOVERSION N/A 04/24/2021   Procedure: TRANSESOPHAGEAL ECHOCARDIOGRAM (TEE);  Surgeon: Lelon Perla, MD;  Location: [facility];  Service: Cardiovascular;  Laterality: N/A;   TEE WITHOUT CARDIOVERSION N/A 06/04/2021   Procedure: TRANSESOPHAGEAL ECHOCARDIOGRAM (TEE);  Surgeon: Elouise Munroe, MD;  Location: [place];  Service: Cardiology;  Laterality: N/A;   TONSILLECTOMY     TOTAL HIP ARTHROPLASTY Right 06/14/2020   Procedure: TOTAL HIP ARTHROPLASTY ANTERIOR APPROACH;  Surgeon: Gaynelle Arabian, MD;  Location: [facility];  Service: Orthopedics;  Laterality: Right;   WISDOM TOOTH EXTRACTION       Medications Prior to Admission: Prior to Admission medications   Medication Sig Start Date End Date Taking? Authorizing Provider  amLODipine (NORVASC) 5 MG  tablet TAKE 1 TABLET(5 MG) BY MOUTH DAILY Patient taking differently: Take 5 mg by mouth daily. 07/13/21  Yes Buford Dresser, MD  buPROPion (WELLBUTRIN XL) 150 MG 24 hr tablet Take 150 mg by mouth daily. 08/07/20  Yes [provider]  colchicine 0.6 MG tablet Take 1 tablet (0.6 mg total) by mouth 2 (two) times daily. 08/10/21 08/10/22 Yes Camnitz, Will Hassell Done, MD  ezetimibe (ZETIA) 10 MG tablet Take 10 mg by mouth daily. 06/09/19  Yes [provider]  metoprolol succinate (TOPROL-XL) 100 MG 24 hr tablet TAKE 1 TABLET BY MOUTH DAILY WITH OR IMMEDIATELY FOLLOWING A MEAL Patient taking differently: 100 mg daily. 07/02/21  Yes Buford Dresser, MD  rivaroxaban (XARELTO) 20 MG TABS tablet Take 1 tablet (20 mg total) by mouth daily with supper. 04/26/21  Yes Camnitz, Will Hassell Done, MD  sacubitril-valsartan (ENTRESTO) 97-103 MG Take 1 tablet by mouth 2 (two) times daily. 05/29/21  Yes Buford Dresser, MD  sertraline (ZOLOFT) 100 MG tablet Take 100 mg by mouth daily.  08/08/10  Yes [provider]     Allergies:    Allergies  Allergen Reactions   Apixaban     Other reaction(s): failed this with clot in heart. resolved on xarelto   Atorvastatin     Other reaction(s): felt bad, draggy, rough. Other reaction(s): felt bad, draggy, rough.   Celecoxib Itching and Dermatitis    Other reaction(s): dermatitis   Pravastatin     Other reaction(s): made him feel bad. Other reaction(s): made him feel bad.    Social History:   Social History   Socioeconomic History   Marital status: Married    Spouse name: Not on file   Number of children: Not on file   Years of education: Not on file   Highest education level: Not on file  Occupational History   Occupation: retired  Tobacco Use   Smoking status: Former    Types: Cigars   Smokeless tobacco: Never   Tobacco comments:    Quit about 3-4 months ago   Scientific laboratory technician Use: Never used  Substance and Sexual  Activity   Alcohol use: No   Drug use: No   Sexual activity: Not on file  Other Topics Concern   Not on file  Social History Narrative   Not on file   Social Determinants of Health   Financial Resource Strain: Not on file  Food Insecurity: Not on file  Transportation Needs: Not on file  Physical Activity: Not on file  Stress: Not on file  Social Connections: Not on file  Intimate Partner Violence: Not on file    Family History:   [name] includes Heart attack (age of onset: 19) in his brother; Hypertension in his brother and father; Kidney Stones in his mother.    ROS:  Please see the  history of present illness. All other ROS reviewed and negative.     Physical Exam/Data:   Vitals:   08/11/21 1400 08/11/21 1430 08/11/21 1500 08/11/21 1645  BP: (!) 133/97 125/90 122/85 107/76  Pulse: 95 91 90 81  Resp: 20  20 18   Temp:      TempSrc:      SpO2: 97% 95% 95% 96%   No intake or output data in the 24 hours ending 08/11/21 1654 Last 3 Weights 08/10/2021 08/08/2021 08/06/2021  Weight (lbs) 195 lb 191 lb 9.6 oz 195 lb 3.2 oz  Weight (kg) 88.451 kg 86.909 kg 88.542 kg     There is no height or weight on file to calculate BMI.   EKG:  The ECG that was done today was personally reviewed and demonstrates sinus rhythm, heart rate 87, QT/QTc 382/487 1/13, after his ablation, QT/QTc was 446/504  Relevant CV Studies:  ECHO: 08/11/2021  1. Left ventricular ejection fraction, by estimation, is 30 to 35%. The  left ventricle has moderately decreased function. The left ventricle  demonstrates global hypokinesis. There is mild concentric left ventricular  hypertrophy. Indeterminate diastolic filling due to E-A fusion.   2. Right ventricular systolic function is normal. The right ventricular  size is normal. Tricuspid regurgitation signal is inadequate for assessing  PA pressure.   3. Left atrial size was severely dilated.   4. The pericardial effusion is surrounding  the apex but trivial.   5. The mitral valve is abnormal. There is evidence of bileaflet mitral  valve prolapse and abnormal thickening. This study does not clearly show  mitral annular disjunction. No evidence of mitral valve regurgitation.   6. The aortic valve was not well visualized. Aortic valve regurgitation  is mild. No aortic stenosis is present.   Comparison(s): No significant change from prior study.   ATRIAL FIBS ABLATION: 08/10/2021 POSTPROCEDURE DIAGNOSES: 1. Persistent atrial fibrillation.   PROCEDURES: 1. Comprehensive electrophysiologic study. 2. Coronary sinus pacing and recording. 3. Three-dimensional mapping of atrial fibrillation (with additional mapping and ablation within the left atrium due to persistence of afib) 4. Ablation of atrial fibrillation (with additional mapping and ablation within the left atrium due to persistence of afib) 5. Intracardiac echocardiography. 6. Transseptal puncture of an intact septum. 7. Arrhythmia induction with pacing 8. External cardioversion.  CARDIAC CATH: 04/15/2018 Lat Ramus lesion is 40% stenosed. Otherwise minimal CAD. There is mild left ventricular systolic dysfunction. The left ventricular ejection fraction is 45-50% by visual estimate.   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  Laboratory Data:  High Sensitivity Troponin:   Recent Labs  Lab 08/11/21 1242 08/11/21 1400  TROPONINIHS 1,506* 1,389*      Chemistry Recent Labs  Lab 08/06/21 1216 08/11/21 1242  NA 140 134*  K 4.4 3.7  CL 103 104  CO2 28 25  GLUCOSE 112* 134*  BUN 19 11  CREATININE 1.27 1.01  CALCIUM 9.7 8.1*  GFRNONAA  --  >60  ANIONGAP  --  5    No results for input(s): PROT, ALBUMIN, AST, ALT, ALKPHOS, BILITOT in the last 168 hours. Lipids No results for input(s): CHOL, TRIG, HDL, LABVLDL, LDLCALC, CHOLHDL in the last 168 hours. Hematology Recent Labs  Lab 08/06/21 1216 08/11/21 1242  WBC 5.7 9.0  RBC 5.19 4.49  HGB  16.5 14.4  HCT 48.7 43.4  MCV 94 96.7  MCH 31.8 32.1  MCHC 33.9 33.2  RDW 14.5 13.2  PLT 195 143*   Thyroid No results  for input(s): TSH, FREET4 in the last 168 hours. BNP Recent Labs  Lab 08/11/21 1357  BNP 313.4*    DDimer  Recent Labs  Lab 08/11/21 1357  DDIMER 0.52*     Radiology/Studies:  DG Chest 2 View  Result Date: 08/11/2021 CLINICAL DATA:  72 year old male with history of chest pain. EXAM: CHEST - 2 VIEW COMPARISON:  Chest x-ray 03/04/2007. FINDINGS: Lung volumes are slightly low. There is cephalization of the pulmonary vasculature and slight indistinctness of the interstitial markings suggestive of mild pulmonary edema. No pleural effusions. Mild cardiomegaly with left atrial dilatation. Upper mediastinal contours are within normal limits. IMPRESSION: 1. The appearance the chest suggests very mild congestive heart failure. Electronically Signed   By: Vinnie Langton M.D.   On: 08/11/2021 12:55   ECHOCARDIOGRAM COMPLETE  Result Date: 08/11/2021    ECHOCARDIOGRAM REPORT   Patient Name:   Juan Montes Date of Exam: 08/11/2021 Medical Rec #:  [id number]     Height:       67.0 in Accession #:    [id number]    Weight:       195.0 lb Date of Birth:  1950/11/08     BSA:          2.001 m Patient Age:    34 years      BP:           133/97 mmHg Patient Gender: M             HR:           78 bpm. Exam Location:  Inpatient Procedure: 2D Echo STAT ECHO Indications:    chest pain  History:        Patient has prior history of Echocardiogram examinations, most                 recent 06/04/2021. Cardiomyopathy, ablation; Risk Factors:Sleep                 Apnea, Dyslipidemia and Hypertension.  Sonographer:    Johny Chess RDCS Referring Phys: 61 [place]  1. Left ventricular ejection fraction, by estimation, is 30 to 35%. The left ventricle has moderately decreased function. The left ventricle demonstrates global hypokinesis. There is mild concentric left ventricular  hypertrophy. Indeterminate diastolic filling due to E-A fusion.  2. Right ventricular systolic function is normal. The right ventricular size is normal. Tricuspid regurgitation signal is inadequate for assessing PA pressure.  3. Left atrial size was severely dilated.  4. The pericardial effusion is surrounding the apex but trivial.  5. The mitral valve is abnormal. 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 This study does not clearly show mitral annular disjunction. No evidence of mitral valve regurgitation.  6. The aortic valve was not well visualized. Aortic valve regurgitation is mild. No aortic stenosis is present. Comparison(s): No significant change from prior study. FINDINGS  Left Ventricle: Left ventricular ejection fraction, by estimation, is 30 to 35%. The left ventricle has moderately decreased function. The left ventricle demonstrates global hypokinesis. The left ventricular internal cavity size was normal in size. There is mild concentric left ventricular hypertrophy. Indeterminate diastolic filling due to E-A fusion. Right Ventricle: The right ventricular size is normal. No increase in right ventricular [place] thickness. Right ventricular systolic function is normal. Tricuspid regurgitation signal is inadequate for assessing PA pressure. Left Atrium: Left atrial size was severely dilated. Right Atrium: Right atrial size was normal in size. Pericardium: Trivial  pericardial effusion is present. The pericardial effusion is surrounding the apex. Mitral Valve: The mitral valve is abnormal. No evidence of mitral valve regurgitation. Tricuspid Valve: The tricuspid valve is normal in structure. Tricuspid valve regurgitation is not demonstrated. No evidence of tricuspid stenosis. Aortic Valve: The aortic valve was not well visualized. Aortic valve regurgitation is mild. No aortic stenosis is present. Pulmonic Valve: The pulmonic valve was grossly normal. Pulmonic valve  regurgitation is trivial. No evidence of pulmonic stenosis. Aorta: The aortic root and ascending aorta are structurally normal, with no evidence of dilitation. IAS/Shunts: No atrial level shunt detected by color flow Doppler.  LEFT VENTRICLE PLAX 2D LVIDd:         5.70 cm     Diastology LVIDs:         4.60 cm     LV e' medial:  7.62 cm/s LV PW:         1.10 cm     LV e' lateral: 8.38 cm/s LV IVS:        1.20 cm LVOT diam:     2.60 cm LV SV:         58 LV SV Index:   29 LVOT Area:     5.31 cm  LV Volumes (MOD) LV vol d, MOD A4C: 99.8 ml LV vol s, MOD A4C: 60.6 ml LV SV MOD A4C:     99.8 ml RIGHT VENTRICLE             IVC RV S prime:     12.10 cm/s  IVC diam: 3.80 cm TAPSE (M-mode): 1.6 cm LEFT ATRIUM              Index        RIGHT ATRIUM           Index LA diam:        4.50 cm  2.25 cm/m   RA Area:     21.40 cm LA Vol (A2C):   104.0 ml 51.98 ml/m  RA Volume:   62.00 ml  30.99 ml/m LA Vol (A4C):   92.6 ml  46.29 ml/m LA Biplane Vol: 104.0 ml 51.98 ml/m  AORTIC VALVE LVOT Vmax:   63.80 cm/s LVOT Vmean:  40.700 cm/s LVOT VTI:    0.110 m  AORTA Ao Root diam: 3.50 cm Ao Asc diam:  3.20 cm  SHUNTS Systemic VTI:  0.11 m Systemic Diam: 2.60 cm Rudean Haskell MD Electronically signed by Rudean Haskell MD Signature Date/Time: 08/11/2021/3:56:14 PM    Final      Severity of Illness: The appropriate patient status for this patient is OBSERVATION. Observation status is judged to be reasonable and necessary in order to provide the required intensity of service to ensure the patient's safety. The patient's presenting symptoms, physical exam findings, and initial radiographic and laboratory data in the context of their medical condition is felt to place them at decreased risk for further clinical deterioration. Furthermore, it is anticipated that the patient will be medically stable for discharge from the hospital within 2 midnights of admission.    For questions or updates, please contact [place] Please consult [email or web address] for contact info under     Signed, Rosaria Ferries, PA-C  08/11/2021 4:54 PM   History and all data above reviewed.   The patient had an ablation yesterday.  This was successful.  He has a long history of dilated cardiomyopathy.  He has had a cough he said for many weeks.  He thinks  it was worse last night and that is when his pain started.  He had felt well leaving the hospital but then he has had a sharp mid substernal pleuritic chest discomfort.  It is not with palpation.  It is worse taking a deep breath.  It is 9 out of 10 in intensity.  He is having trouble taking deep breaths because of it.  He is not having any PND or orthopnea.  He has not noticed any palpitations.  He is not productive with his cough and has had no fevers.  He has had no nausea vomiting or diaphoresis.   The patient exam reveals GENERAL:  Well appearing HEENT:  Pupils equal round and reactive, fundi not visualized, oral mucosa unremarkable NECK:  No jugular venous distention, waveform within normal limits, carotid upstroke brisk and symmetric, no bruits, no thyromegaly LYMPHATICS:  No cervical, inguinal adenopathy LUNGS: Bilateral basilar crackles BACK:  No CVA tenderness CHEST:  Unremarkable HEART:  PMI not displaced or sustained,S1 and S2 within normal limits, no S3, no S4, no clicks, no rubs, no murmurs ABD:  Flat, positive bowel sounds normal in frequency in pitch, no bruits, no rebound, no guarding, no midline pulsatile mass, no hepatomegaly, no splenomegaly EXT:  2 plus pulses throughout, no edema, no cyanosis no clubbing SKIN:  No rashes no nodules NEURO:  Cranial nerves II through XII grossly intact, motor grossly intact throughout PSYCH:  Cognitively intact, oriented to person place and time  CHEST PAIN: Pleuritic or pericardial in nature.  Discussed with EP.  He needs to be observed for pain management and tramadol was suggested for postinflammatory discomfort.  I  would like to check cardiac enzymes in the morning.  He can have a repeat EKG in the morning as well.  ACUTE ON CHRONIC HF:   The patient has some evidence of volume overload and has had chronic cough.  I will give him a dose of IV Lasix tonight.  Minus Breeding  5:33 PM  08/11/2021

## 2021-08-11 NOTE — Anesthesia Postprocedure Evaluation (Signed)
Anesthesia Post Note  Patient: Juan Montes  Procedure(s) Performed: ATRIAL FIBRILLATION ABLATION     Patient location during evaluation: PACU Anesthesia Type: General Level of consciousness: awake and alert Pain management: pain level controlled Vital Signs Assessment: post-procedure vital signs reviewed and stable Respiratory status: spontaneous breathing, nonlabored ventilation, respiratory function stable and patient connected to nasal cannula oxygen Cardiovascular status: blood pressure returned to baseline and stable Postop Assessment: no apparent nausea or vomiting Anesthetic complications: no   There were no known notable events for this encounter.  Last Vitals:  Vitals:   08/10/21 1400 08/10/21 1415  BP: 124/81   Pulse: 79 78  Resp: 12 16  Temp:    SpO2: 99% 94%    Last Pain:  Vitals:   08/10/21 1157  TempSrc:   PainSc: 0-No pain                 Belenda Cruise P Ayano Douthitt

## 2021-08-11 NOTE — Telephone Encounter (Signed)
Ms. Mustin had called earlier about chest pain and coughing.  I requested they check his weight to see if he has gained any fluid.  There was no lower extremity edema and he was denying shortness of breath.  I did not have a weight for today at that time.  She checked his weight and states that it was up to 198 pounds.  Because of his ongoing pain, weight gain, and elevated heart rate, they decided that he should be evaluated and she is taking him to [facility].  I advised that was a good plan.  Rosaria Ferries, PA-C 08/11/2021 12:18 PM

## 2021-08-12 DIAGNOSIS — I5023 Acute on chronic systolic (congestive) heart failure: Secondary | ICD-10-CM | POA: Diagnosis not present

## 2021-08-12 DIAGNOSIS — R079 Chest pain, unspecified: Secondary | ICD-10-CM | POA: Diagnosis not present

## 2021-08-12 DIAGNOSIS — R072 Precordial pain: Secondary | ICD-10-CM | POA: Diagnosis not present

## 2021-08-12 MED ORDER — COLCHICINE 0.6 MG PO TABS: 0.6000 mg | ORAL_TABLET | Freq: Two times a day (BID) | ORAL | 0 refills | Status: DC

## 2021-08-12 MED ORDER — IBUPROFEN 800 MG PO TABS
800.0000 mg | ORAL_TABLET | Freq: Two times a day (BID) | ORAL | 0 refills | Status: DC
Start: 2021-08-12 — End: 2022-02-06

## 2021-08-12 NOTE — Discharge Instructions (Signed)
Please keep all scheduled follow-up appointments

## 2021-08-12 NOTE — Discharge Summary (Signed)
Discharge Summary    Patient ID: LEARY MCNULTY MRN: [id number]; DOB: 1951-01-30  Admit date: 08/11/2021 Discharge date: 08/12/2021  PCP:  Crist Infante, MD   [facility] Providers Cardiologist:  Buford Dresser, MD  Electrophysiologist:  Constance Haw, MD       Discharge Diagnoses    Principal Problem:   Chest pain of uncertain etiology Active Problems:   Chest pain    Diagnostic Studies/Procedures    ECHO: 08/11/2021  1. Left ventricular ejection fraction, by estimation, is 30 to 35%. The  left ventricle has moderately decreased function. The left ventricle  demonstrates global hypokinesis. There is mild concentric left ventricular  hypertrophy. Indeterminate diastolic  filling due to E-A fusion.   2. Right ventricular systolic function is normal. The right ventricular  size is normal. Tricuspid regurgitation signal is inadequate for assessing  PA pressure.   3. Left atrial size was severely dilated.   4. The pericardial effusion is surrounding the apex but trivial.   5. The mitral valve is abnormal. There is evidence of bileaflet mitral  valve prolapse and abnormal thickening. This study does not clearly show  mitral annular disjunction. No evidence of mitral valve regurgitation.   6. The aortic valve was not well visualized. Aortic valve regurgitation  is mild. No aortic stenosis is present.   Comparison(s): No significant change from prior study.  _____________   History of Present Illness     Juan Montes is a 71 y.o. male with A. fib s/p ablation 08/10/2021, combined CHF, nonobstructive CAD, HTN, HLD, OSA, depression, was admitted 1/14 for chest pain.  Hospital Course     Consultants: None  The chest pain was pleuritic in nature.  He had been coughing severely, the coughing kept him up all night the night before.  They checked his weight and his weight was up about 5 pounds.  He was given a dose of IV Lasix 40 mg.  He reported good urine output  from that and his respiratory status improved.  The cough improved.  At this time, he does not need daily Lasix.  His cardiac enzymes were elevated, but the elevation was in line with his recent procedure.  His cardiac enzymes trended down after the initial value.  His EKG and presentation were consistent with pericarditis.  This was treated with tramadol.  He was on colchicine.  He is to be discharged with Motrin for 3 days, but not longer because of his need for ongoing DOAC.  On 1/15, he was seen by Dr. Percival Spanish and all data were reviewed.  His breathing and chest pain were much improved.  The cough is almost completely resolved.  No further inpatient work-up is indicated and he is considered stable for discharge, to follow-up as an outpatient.   Did the patient have an acute coronary syndrome (MI, NSTEMI, STEMI, etc) this admission?:  No.   The elevated Troponin was due to the acute medical illness (demand ischemia).   _____________  Discharge Vitals Blood pressure 122/83, pulse 88, temperature 99 F (37.2 C), temperature source Oral, resp. rate 18, SpO2 92 %.  There were no vitals filed for this visit.  Labs & Radiologic Studies    CBC Recent Labs    08/11/21 1242  WBC 9.0  HGB 14.4  HCT 43.4  MCV 96.7  PLT 170*   Basic Metabolic Panel Recent Labs    08/11/21 1242  NA 134*  K 3.7  CL 104  CO2 25  GLUCOSE  134*  BUN 11  CREATININE 1.01  CALCIUM 8.1*   Liver Function Tests No results for input(s): AST, ALT, ALKPHOS, BILITOT, PROT, ALBUMIN in the last 72 hours. No results for input(s): LIPASE, AMYLASE in the last 72 hours. High Sensitivity Troponin:   Recent Labs  Lab 08/11/21 1242 08/11/21 1400 08/11/21 2037 08/11/21 2138  TROPONINIHS 1,506* 1,389* 1,195* 1,171*    BNP Invalid input(s): POCBNP D-Dimer Recent Labs    08/11/21 1357  DDIMER 0.52*   Hemoglobin A1C No results for input(s): HGBA1C in the last 72 hours. Fasting Lipid Panel No results for  input(s): CHOL, HDL, LDLCALC, TRIG, CHOLHDL, LDLDIRECT in the last 72 hours. Thyroid Function Tests No results for input(s): TSH, T4TOTAL, T3FREE, THYROIDAB in the last 72 hours.  Invalid input(s): FREET3 _____________  DG Chest 2 View  Result Date: 08/11/2021 CLINICAL DATA:  71 year old male with history of chest pain. EXAM: CHEST - 2 VIEW COMPARISON:  Chest x-ray 03/04/2007. FINDINGS: Lung volumes are slightly low. There is cephalization of the pulmonary vasculature and slight indistinctness of the interstitial markings suggestive of mild pulmonary edema. No pleural effusions. Mild cardiomegaly with left atrial dilatation. Upper mediastinal contours are within normal limits. IMPRESSION: 1. The appearance the chest suggests very mild congestive heart failure. Electronically Signed   By: Vinnie Langton M.D.   On: 08/11/2021 12:55   EP STUDY  Result Date: 08/10/2021 SURGEON:  Allegra Lai, MD PREPROCEDURE DIAGNOSES: 1. Persistent atrial fibrillation. POSTPROCEDURE DIAGNOSES: 1. Persistent atrial fibrillation. PROCEDURES: 1. Comprehensive electrophysiologic study. 2. Coronary sinus pacing and recording. 3. Three-dimensional mapping of atrial fibrillation (with additional mapping and ablation within the left atrium due to persistence of afib) 4. Ablation of atrial fibrillation (with additional mapping and ablation within the left atrium due to persistence of afib) 5. Intracardiac echocardiography. 6. Transseptal puncture of an intact septum. 7. Arrhythmia induction with pacing 8. External cardioversion. INTRODUCTION:  Juan Montes is a 71 y.o. male with a history of persistent atrial fibrillation who now presents for EP study and radiofrequency ablation.  The patient reports initially being diagnosed with atrial fibrillation after presenting with symptomatic palpitations and fatgiue.  The patient has failed medical therapy.  The patient therefore presents today for catheter ablation of atrial  fibrillation. DESCRIPTION OF PROCEDURE:  Informed written consent was obtained, and the patient was brought to the electrophysiology lab in a fasting state.  The patient was adequately sedated with intravenous medications as outlined in the anesthesia report.  The patient's left and right groins were prepped and draped in the usual sterile fashion by the EP lab staff.  Using a percutaneous Seldinger technique, two 7-French and one 11-French hemostasis sheaths were placed into the right common femoral vein.  An esophageal temperature probe was inserted to monitor for heating of the esophagus during the procedure. Direct ultrasound guidance is used for right and left femoral veins with normal vessel patency. Ultrasound images are captured and stored in the patient's chart. Using ultrasound guidance, the Brockenbrough needle and wire were visualized entering the vessel. Catheter Placement:  A 7-French Biosense Webster Decapolar coronary sinus catheter was introduced through the right common femoral vein and advanced into the coronary sinus for recording and pacing from this location.    A luminal esophageal temperature probe was placed and used for continuous monitoring of the luminal esophageal temperature throughout the procedure as well as to localize the esophagus on fluoroscopy. In addition, the esophagus was directly visualized with intracardiac echo and its positioned marked  on Carto.  During ablation at the posterior Froning there was limited esophageal heating noted during RF energy delivery with the maximal temperature recorded by the luminal temperature probe of < 38.5 degrees C. Initial Measurements: The patient presented to the electrophysiology lab in atrial fibrillation.   The average RR interval measured 652 msec.   Intracardiac Echocardiography: A 10-French Biosense Webster AcuNav intracardiac echocardiography catheter was introduced through the right common femoral vein and advanced into the right  atrium. Intracardiac echocardiography was performed of the left atrium, and a three-dimensional anatomical rendering of the left atrium was performed using CARTO sound technology.  The patient was noted to have a moderate sized left atrium.  The interatrial septum was prominent but not aneurysmal. All 4 pulmonary veins were visualized and noted to have separate ostia.  The pulmonary veins were moderate in size.  The left atrial appendage was visualized and did not reveal thrombus.   There was no evidence of pulmonary vein stenosis. Transseptal Puncture: The right common femoral vein sheaths were was exchanged for an 8.5 [place] Agillis transseptal sheath and transseptal access was achieved in a standard fashion using a Brockenbrough needle under biplane fluoroscopy with intracardiac echocardiography confirmation of the transseptal puncture.  Once transseptal access had been achieved, heparin was administered intravenously and intra- arterially in order to maintain an ACT of greater than 350 seconds throughout the procedure.  3D Mapping and Ablation: A 3.5 mm Biosense [employer] Thermocool ablation catheter was advanced into the right atrium.  The transseptal sheath was pulled back into the IVC over a guidewire.  The ablation catheter was advanced across the transseptal hole using the wire as a guide.  The transseptal sheath was then re-advanced over the guidewire into the left atrium.  A duodecapolar Biosense Webster pentarray mapping catheter was introduced through the transseptal sheath and positioned over the mouth of all 4 pulmonary veins.  Three-dimensional electroanatomical mapping was performed using CARTO technology.  This demonstrated electrical activity within all four pulmonary veins at baseline. The patient underwent successful sequential electrical isolation and anatomical encircling of all four pulmonary veins using radiofrequency current with a circular mapping catheter as a guide. A WACA  approach was used. Due to persistence of atrial fibrillation, additional left atrial mapping and ablation was performed.  A series of radiofrequency lesions were delivered along the roof and floor of the left atrium in order to create a "standard box" lesion along the posterior Repsher of the left atrium. The ablation catheter was then pulled back into the right atrial and positioned along the cavo-tricuspid isthmus.  3D Mapping along the atrial side of the isthmus was performed.  This demonstrated a standard isthmus.  A series of radiofrequency applications were then delivered along the isthmus at 30-40 Watts with a target index of 450-500.   Complete bidirectional cavotricuspid isthmus block was achieved as confirmed by differential atrial pacing from the low lateral right atrium.  A stimulus to earliest atrial activation across the isthmus measured 150 msec bi-directionally.  The patient was observe without return of conduction through the isthmus. 20 mcg/kg/min dobutamine was infused without arrhythmia induced. Cardioversion: The patient was then cardioverted to sinus rhythm with a single synchronized 150-J biphasic shock with cardioversion electrodes in the anterior-posterior thoracic configuration. he remained in sinus rhythm thereafter. Measurements Following Ablation: In sinus rhythm the RR interval was 830msec, with PR 187 msec, QRS 107 msec, and QT 454 msec.  Following ablation the AH interval measured 94 msec with an  HV interval of 56 msec. Ventricular pacing was performed, which revealed midline decremental VA conduction with a VA Wenckebach cycle length of 660 msec. Rapid atrial pacing was performed, which revealed an AV Wenckebach cycle length of 460 msec.  Electroisolation was then again confirmed in all four pulmonary veins. The procedure was therefore considered completed.  All catheters were removed, and the sheaths were aspirated and flushed.  The patient was transferred to the recovery area for  sheath removal per protocol.  Intracardiac echocardiogram revealed no pericardial effusion. EBL<78ml.  There were no early apparent complications. CONCLUSIONS: 1. Atrial fibrillation upon presentation.  2. Successful electrical isolation and anatomical encircling of all four pulmonary veins with radiofrequency current.  A WACA approach was used 3. Additional left atrial ablation was performed with a standard box lesion created along the posterior Alyea of the left atrium 4. Atrial fibrillation successfully cardioverted to sinus rhythm. 5. No early apparent complications. Will Hassell Done Camnitz,MD 10:35 AM 08/10/2021  CT CARDIAC MORPH/PULM VEIN W/CM&W/O CA SCORE  Addendum Date: 08/07/2021   ADDENDUM REPORT: 08/07/2021 14:59 CLINICAL DATA:  Atrial fibrillation scheduled for an ablation. EXAM: Cardiac CT/CTA TECHNIQUE: The patient was scanned on a Siemens Somatom scanner. FINDINGS: A 120 kV prospective scan was triggered in the descending thoracic aorta at 111 HU's. Gantry rotation speed was 280 msecs and collimation was .9 mm. No beta blockade and no NTG was given. The 3D data set was reconstructed in 5% intervals of the 60-80 % of the R-R cycle. Diastolic phases were analyzed on a dedicated work station using MPR, MIP and VRT modes. The patient received 80 cc of contrast. Left atrium: Severely dilated. Filling defect at tip of appendage on initial images, but appears to fill on delayed images (increase in HU from 40 to 80), suggesting slow filling but no thrombus in the left atrium or left atrial appendage. Left ventricle: Normal in size. Right atrium: Moderately dilated Right ventricle: Mildly enlarged Pericardium: Normal thickness Pulmonary veins: Left superior pulmonary vein: Measures 2.5cm x 1.6cm in diameter with area 3.3cm^2. Left inferior pulmonary vein: Measures 2.0cm x 1.1cm in diameter with area 1.7cm^2. Right superior pulmonary vein: Measures 2.2cm x 1.8cm in diameter with area 3.1cm^2. Right inferior  pulmonary vein: Measures 2.4cm x 2.1cm in diameter with area 3.8cm^2. Coronary Arteries: Normal coronary origin. Right dominance. The study was performed without use of NTG and insufficient for plaque evaluation. Calcium score 286 (64th percentile) Esophagus: Courses posterior to ostium of left sided pulmonary veins IMPRESSION: 1. There is normal pulmonary vein drainage into the left atrium. Measurements as reported 2. There is a filling defect in left atrial appendage on initial images, but appears to fill on delayed images (increase in HU from 40 to 80 at appendage tip), suggesting slow filling but no thrombus in the left atrial appendage. 3. The esophagus courses posterior to ostium of left sided pulmonary veins 4.  Coronary calcium score 286 (64th percentile) Electronically Signed   By: Oswaldo Milian M.D.   On: 08/07/2021 14:59   Result Date: 08/07/2021 EXAM: OVER-READ INTERPRETATION  CT CHEST The following report is an over-read performed by radiologist Dr. Abigail Miyamoto of [facility], [place] on 08/07/2021. This over-read does not include interpretation of cardiac or coronary anatomy or pathology. The coronary CTA interpretation by the cardiologist is attached. COMPARISON:  04/18/2021 FINDINGS: Vascular: Aortic atherosclerosis. No central pulmonary embolism, on this non-dedicated study. Mediastinum/Nodes: No imaged thoracic adenopathy. Lungs/Pleura: No pleural fluid.  Clear imaged lungs. Upper Abdomen: Normal imaged  portions of the liver, spleen, stomach. Musculoskeletal: No acute osseous abnormality. IMPRESSION: 1.  No acute findings in the imaged extracardiac chest. 2.  Aortic Atherosclerosis (ICD10-I70.0). Electronically Signed: By: Abigail Miyamoto M.D. On: 08/07/2021 13:41   ECHOCARDIOGRAM COMPLETE  Result Date: 08/11/2021    ECHOCARDIOGRAM REPORT   Patient Name:   Cheryl A Wethington Date of Exam: 08/11/2021 Medical Rec #:  [id number]     Height:       67.0 in Accession #:    [id number]    Weight:        195.0 lb Date of Birth:  1951/03/16     BSA:          2.001 m Patient Age:    10 years      BP:           133/97 mmHg Patient Gender: M             HR:           78 bpm. Exam Location:  Inpatient Procedure: 2D Echo STAT ECHO Indications:    chest pain  History:        Patient has prior history of Echocardiogram examinations, most                 recent 06/04/2021. Cardiomyopathy, ablation; Risk Factors:Sleep                 Apnea, Dyslipidemia and Hypertension.  Sonographer:    Johny Chess RDCS Referring Phys: 60 [place]  1. Left ventricular ejection fraction, by estimation, is 30 to 35%. The left ventricle has moderately decreased function. The left ventricle demonstrates global hypokinesis. There is mild concentric left ventricular hypertrophy. Indeterminate diastolic filling due to E-A fusion.  2. Right ventricular systolic function is normal. The right ventricular size is normal. Tricuspid regurgitation signal is inadequate for assessing PA pressure.  3. Left atrial size was severely dilated.  4. The pericardial effusion is surrounding the apex but trivial.  5. The mitral valve is abnormal. 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 This study does not clearly show mitral annular disjunction. No evidence of mitral valve regurgitation.  6. The aortic valve was not well visualized. Aortic valve regurgitation is mild. No aortic stenosis is present. Comparison(s): No significant change from prior study. FINDINGS  Left Ventricle: Left ventricular ejection fraction, by estimation, is 30 to 35%. The left ventricle has moderately decreased function. The left ventricle demonstrates global hypokinesis. The left ventricular internal cavity size was normal in size. There is mild concentric left ventricular hypertrophy. Indeterminate diastolic filling due to E-A fusion. Right Ventricle: The right ventricular size is normal. No increase in right ventricular  Funderburk thickness. Right ventricular systolic function is normal. Tricuspid regurgitation signal is inadequate for assessing PA pressure. Left Atrium: Left atrial size was severely dilated. Right Atrium: Right atrial size was normal in size. Pericardium: Trivial pericardial effusion is present. The pericardial effusion is surrounding the apex. Mitral Valve: The mitral valve is abnormal. No evidence of mitral valve regurgitation. Tricuspid Valve: The tricuspid valve is normal in structure. Tricuspid valve regurgitation is not demonstrated. No evidence of tricuspid stenosis. Aortic Valve: The aortic valve was not well visualized. Aortic valve regurgitation is mild. No aortic stenosis is present. Pulmonic Valve: The pulmonic valve was grossly normal. Pulmonic valve regurgitation is trivial. No evidence of pulmonic stenosis. Aorta: The aortic root and ascending aorta are structurally normal, with no evidence of dilitation.  IAS/Shunts: No atrial level shunt detected by color flow Doppler.  LEFT VENTRICLE PLAX 2D LVIDd:         5.70 cm     Diastology LVIDs:         4.60 cm     LV e' medial:  7.62 cm/s LV PW:         1.10 cm     LV e' lateral: 8.38 cm/s LV IVS:        1.20 cm LVOT diam:     2.60 cm LV SV:         58 LV SV Index:   29 LVOT Area:     5.31 cm  LV Volumes (MOD) LV vol d, MOD A4C: 99.8 ml LV vol s, MOD A4C: 60.6 ml LV SV MOD A4C:     99.8 ml RIGHT VENTRICLE             IVC RV S prime:     12.10 cm/s  IVC diam: 3.80 cm TAPSE (M-mode): 1.6 cm LEFT ATRIUM              Index        RIGHT ATRIUM           Index LA diam:        4.50 cm  2.25 cm/m   RA Area:     21.40 cm LA Vol (A2C):   104.0 ml 51.98 ml/m  RA Volume:   62.00 ml  30.99 ml/m LA Vol (A4C):   92.6 ml  46.29 ml/m LA Biplane Vol: 104.0 ml 51.98 ml/m  AORTIC VALVE LVOT Vmax:   63.80 cm/s LVOT Vmean:  40.700 cm/s LVOT VTI:    0.110 m  AORTA Ao Root diam: 3.50 cm Ao Asc diam:  3.20 cm  SHUNTS Systemic VTI:  0.11 m Systemic Diam: 2.60 cm Rudean Haskell MD Electronically signed by Rudean Haskell MD Signature Date/Time: 08/11/2021/3:56:14 PM    Final    Disposition   Pt is being discharged home today in good condition.  Follow-up Plans & Appointments     Follow-up Information     [place]. Go to .   Specialty: Emergency Medicine Why: As needed, If symptoms worsen Contact information: [address] [phone] [place], MD .   Specialty: Internal Medicine Contact information: [address] [place] [address] [phone]                Discharge Instructions     ([place]) Call MD:  Anytime you have any of the following symptoms: 1) 3 pound weight gain in 24 hours or 5 pounds in 1 week 2) shortness of breath, with or without a dry hacking cough 3) swelling in the hands, feet or stomach 4) if you have to sleep on extra pillows at night in order to breathe.   Complete by: As directed    Call MD for:  redness, tenderness, or signs of infection (pain, swelling, redness, odor or green/yellow discharge around incision site)   Complete by: As directed    Diet - low sodium heart healthy   Complete by: As directed    Increase activity slowly   Complete by: As directed        Discharge Medications   Allergies as of 08/12/2021       Reactions   Apixaban    Other reaction(s): failed  this with clot in heart. resolved on xarelto   Atorvastatin    Other reaction(s): felt bad, draggy, rough. Other reaction(s): felt bad, draggy, rough.   Celecoxib Itching, Dermatitis   Other reaction(s): dermatitis   Pravastatin    Other reaction(s): made him feel bad. Other reaction(s): made him feel bad.        Medication List     TAKE these medications    amLODipine 5 MG tablet Commonly known as: NORVASC TAKE 1 TABLET(5 MG) BY MOUTH DAILY What changed: See the new  instructions.   buPROPion 150 MG 24 hr tablet Commonly known as: WELLBUTRIN XL Take 150 mg by mouth daily.   colchicine 0.6 MG tablet Take 1 tablet (0.6 mg total) by mouth 2 (two) times daily.   Entresto 97-103 MG Generic drug: sacubitril-valsartan Take 1 tablet by mouth 2 (two) times daily.   ezetimibe 10 MG tablet Commonly known as: ZETIA Take 10 mg by mouth daily.   ibuprofen 800 MG tablet Commonly known as: ADVIL Take 1 tablet (800 mg total) by mouth 2 (two) times daily.   metoprolol succinate 100 MG 24 hr tablet Commonly known as: TOPROL-XL TAKE 1 TABLET BY MOUTH DAILY WITH OR IMMEDIATELY FOLLOWING A MEAL What changed: See the new instructions.   rivaroxaban 20 MG Tabs tablet Commonly known as: XARELTO Take 1 tablet (20 mg total) by mouth daily with supper.   sertraline 100 MG tablet Commonly known as: ZOLOFT Take 100 mg by mouth daily.           Outstanding Labs/Studies   None   Duration of Discharge Encounter   Greater than 30 minutes including physician time.  Signed, Rosaria Ferries, PA-C 08/12/2021, 11:55 AM

## 2021-08-12 NOTE — ED Notes (Signed)
Reviewed discharge instructions with patient and spouse. Follow-up care and pain management reviewed. Patient and spouse verbalized understanding. Patient A&Ox4, VSS, and ambulatory with steady gait upon discharge.

## 2021-08-12 NOTE — Progress Notes (Signed)
Progress Note  Patient Name: Juan Montes Date of Encounter: 08/12/2021  Primary Cardiologist:   Buford Dresser, MD   Subjective   Chest pain is much improved.  No SOB. Not coughing this morning.    Inpatient Medications    Scheduled Meds:  amLODipine  5 mg Oral Daily   aspirin  325 mg Oral Daily   buPROPion  150 mg Oral Daily   colchicine  0.6 mg Oral BID   ezetimibe  10 mg Oral Daily   metoprolol succinate  100 mg Oral Daily   rivaroxaban  20 mg Oral Q supper   sacubitril-valsartan  1 tablet Oral BID   sertraline  100 mg Oral Daily   sodium chloride flush  3 mL Intravenous Q12H   Continuous Infusions:  sodium chloride     PRN Meds: sodium chloride, acetaminophen, ALPRAZolam, nitroGLYCERIN, ondansetron (ZOFRAN) IV, sodium chloride flush, traMADol, zolpidem   Vital Signs    Vitals:   08/12/21 0415 08/12/21 0500 08/12/21 0600 08/12/21 0700  BP: 114/88 104/81 112/80 117/79  Pulse: 79 83 82 82  Resp: 20 20 20    Temp:      TempSrc:      SpO2: 94% 92% 91% 92%   No intake or output data in the 24 hours ending 08/12/21 0900 There were no vitals filed for this visit.  Telemetry    NA - Personally Reviewed  ECG    NSR, diffuse ST elevation consistent more pronounced than previous.  I suspect pericarditis.  QT prolonged.  - Personally Reviewed  Physical Exam   GEN: No acute distress.   Neck: No  JVD Cardiac: RRR, no murmurs, rubs, or gallops.  Respiratory: Clear  to auscultation bilaterally. GI: Soft, nontender, non-distended  MS: No  edema; No deformity. Neuro:  Nonfocal  Psych: Normal affect   Labs    Chemistry Recent Labs  Lab 08/06/21 1216 08/11/21 1242  NA 140 134*  K 4.4 3.7  CL 103 104  CO2 28 25  GLUCOSE 112* 134*  BUN 19 11  CREATININE 1.27 1.01  CALCIUM 9.7 8.1*  GFRNONAA  --  >60  ANIONGAP  --  5     Hematology Recent Labs  Lab 08/06/21 1216 08/11/21 1242  WBC 5.7 9.0  RBC 5.19 4.49  HGB 16.5 14.4  HCT 48.7 43.4   MCV 94 96.7  MCH 31.8 32.1  MCHC 33.9 33.2  RDW 14.5 13.2  PLT 195 143*    Cardiac EnzymesNo results for input(s): TROPONINI in the last 168 hours. No results for input(s): TROPIPOC in the last 168 hours.   BNP Recent Labs  Lab 08/11/21 1357 08/11/21 2038  BNP 313.4* 471.4*     DDimer  Recent Labs  Lab 08/11/21 1357  DDIMER 0.52*     Radiology    DG Chest 2 View  Result Date: 08/11/2021 CLINICAL DATA:  71 year old male with history of chest pain. EXAM: CHEST - 2 VIEW COMPARISON:  Chest x-ray 03/04/2007. FINDINGS: Lung volumes are slightly low. There is cephalization of the pulmonary vasculature and slight indistinctness of the interstitial markings suggestive of mild pulmonary edema. No pleural effusions. Mild cardiomegaly with left atrial dilatation. Upper mediastinal contours are within normal limits. IMPRESSION: 1. The appearance the chest suggests very mild congestive heart failure. Electronically Signed   By: Vinnie Langton M.D.   On: 08/11/2021 12:55   ECHOCARDIOGRAM COMPLETE  Result Date: 08/11/2021    ECHOCARDIOGRAM REPORT   Patient Name:   [facility]  A Odor Date of Exam: 08/11/2021 Medical Rec #:  [id number]     Height:       67.0 in Accession #:    [id number]    Weight:       195.0 lb Date of Birth:  [birth year]     BSA:          2.001 m Patient Age:    3 years      BP:           133/97 mmHg Patient Gender: M             HR:           78 bpm. Exam Location:  Inpatient Procedure: 2D Echo STAT ECHO Indications:    chest pain  History:        Patient has prior history of Echocardiogram examinations, most                 recent 06/04/2021. Cardiomyopathy, ablation; Risk Factors:Sleep                 Apnea, Dyslipidemia and Hypertension.  Sonographer:    Johny Chess RDCS Referring Phys: 3 [place]  1. Left ventricular ejection fraction, by estimation, is 30 to 35%. The left ventricle has moderately decreased function. The left ventricle demonstrates  global hypokinesis. There is mild concentric left ventricular hypertrophy. Indeterminate diastolic filling due to E-A fusion.  2. Right ventricular systolic function is normal. The right ventricular size is normal. Tricuspid regurgitation signal is inadequate for assessing PA pressure.  3. Left atrial size was severely dilated.  4. The pericardial effusion is surrounding the apex but trivial.  5. The mitral valve is abnormal. 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 This study does not clearly show mitral annular disjunction. No evidence of mitral valve regurgitation.  6. The aortic valve was not well visualized. Aortic valve regurgitation is mild. No aortic stenosis is present. Comparison(s): No significant change from prior study. FINDINGS  Left Ventricle: Left ventricular ejection fraction, by estimation, is 30 to 35%. The left ventricle has moderately decreased function. The left ventricle demonstrates global hypokinesis. The left ventricular internal cavity size was normal in size. There is mild concentric left ventricular hypertrophy. Indeterminate diastolic filling due to E-A fusion. Right Ventricle: The right ventricular size is normal. No increase in right ventricular Spiers thickness. Right ventricular systolic function is normal. Tricuspid regurgitation signal is inadequate for assessing PA pressure. Left Atrium: Left atrial size was severely dilated. Right Atrium: Right atrial size was normal in size. Pericardium: Trivial pericardial effusion is present. The pericardial effusion is surrounding the apex. Mitral Valve: The mitral valve is abnormal. No evidence of mitral valve regurgitation. Tricuspid Valve: The tricuspid valve is normal in structure. Tricuspid valve regurgitation is not demonstrated. No evidence of tricuspid stenosis. Aortic Valve: The aortic valve was not well visualized. Aortic valve regurgitation is mild. No aortic stenosis is present. Pulmonic Valve:  The pulmonic valve was grossly normal. Pulmonic valve regurgitation is trivial. No evidence of pulmonic stenosis. Aorta: The aortic root and ascending aorta are structurally normal, with no evidence of dilitation. IAS/Shunts: No atrial level shunt detected by color flow Doppler.  LEFT VENTRICLE PLAX 2D LVIDd:         5.70 cm     Diastology LVIDs:         4.60 cm     LV e' medial:  7.62 cm/s LV PW:  1.10 cm     LV e' lateral: 8.38 cm/s LV IVS:        1.20 cm LVOT diam:     2.60 cm LV SV:         58 LV SV Index:   29 LVOT Area:     5.31 cm  LV Volumes (MOD) LV vol d, MOD A4C: 99.8 ml LV vol s, MOD A4C: 60.6 ml LV SV MOD A4C:     99.8 ml RIGHT VENTRICLE             IVC RV S prime:     12.10 cm/s  IVC diam: 3.80 cm TAPSE (M-mode): 1.6 cm LEFT ATRIUM              Index        RIGHT ATRIUM           Index LA diam:        4.50 cm  2.25 cm/m   RA Area:     21.40 cm LA Vol (A2C):   104.0 ml 51.98 ml/m  RA Volume:   62.00 ml  30.99 ml/m LA Vol (A4C):   92.6 ml  46.29 ml/m LA Biplane Vol: 104.0 ml 51.98 ml/m  AORTIC VALVE LVOT Vmax:   63.80 cm/s LVOT Vmean:  40.700 cm/s LVOT VTI:    0.110 m  AORTA Ao Root diam: 3.50 cm Ao Asc diam:  3.20 cm  SHUNTS Systemic VTI:  0.11 m Systemic Diam: 2.60 cm Rudean Haskell MD Electronically signed by Rudean Haskell MD Signature Date/Time: 08/11/2021/3:56:14 PM    Final     Cardiac Studies   Echo:  1. Left ventricular ejection fraction, by estimation, is 30 to 35%. The  left ventricle has moderately decreased function. The left ventricle  demonstrates global hypokinesis. There is mild concentric left ventricular  hypertrophy. Indeterminate diastolic  filling due to E-A fusion.   2. Right ventricular systolic function is normal. The right ventricular  size is normal. Tricuspid regurgitation signal is inadequate for assessing  PA pressure.   3. Left atrial size was severely dilated.   4. The pericardial effusion is surrounding the apex but trivial.   5.  The mitral valve is abnormal. There is evidence of bileaflet mitral  valve prolapse and abnormal thickening. This study does not clearly show  mitral annular disjunction. No evidence of mitral valve regurgitation.   6. The aortic valve was not well visualized. Aortic valve regurgitation  is mild. No aortic stenosis is present.   Patient Profile     71 y.o. male with a history of non ischemic cardiomyopathy.  Status post ablation on the 13th for atrial fib.  Presented with chest pain.   Assessment & Plan    Chest pain:  Trop is trending down.  EKG and presentation are consistent with pericarditis.  Treated with tramadol.  He is on colchicine.  I would send him home with 800 bid of Motrin only for 3 days given his need for ongoing DOAC.   Acute systolic HF:   No intake and output documented.  Given IV Lasix once yesterday. OK to send home on previous meds.  No need for daily Lasix.     For questions or updates, please contact [place] Please consult [email or web address] for contact info under Cardiology/STEMI.   Signed, Minus Breeding, MD  08/12/2021, 9:00 AM

## 2021-08-12 NOTE — ED Notes (Signed)
Provider at bedside going over discharge w/ pt

## 2021-08-13 ENCOUNTER — Encounter (HOSPITAL_COMMUNITY): Payer: Self-pay | Admitting: Cardiology

## 2021-08-20 ENCOUNTER — Telehealth (HOSPITAL_BASED_OUTPATIENT_CLINIC_OR_DEPARTMENT_OTHER): Payer: Self-pay | Admitting: Cardiology

## 2021-08-20 NOTE — Telephone Encounter (Signed)
Hi there, any blood pressure change recommendations?

## 2021-08-20 NOTE — Telephone Encounter (Signed)
Patient now on colchicine daily, was also given ibuprofen 800 mg tid.  States only took the ibuprofen for 3 days, then stopped.  Has noted BP trending upward for awhile, has been monitored by PCP.    Will have him increase amlodipine to 10 mg and continue with all other medications.  Patient aware that it could take several days for BP to trend down.  If still elevated in 7-10 days he can reach out to the office.  Has appointment with AF clinic in early February.

## 2021-08-20 NOTE — Telephone Encounter (Signed)
Pt c/o BP issue: STAT if pt c/o blurred vision, one-sided weakness or slurred speech  1. What are your last 5 BP readings? 142/98, 145/95, 152/100, 135/91 and 147/96  2. Are you having any other symptoms (ex. Dizziness, headache, blurred vision, passed out)?  Headache, but not at this time  3. What is your BP issue? Blood pressure is running high

## 2021-08-23 DIAGNOSIS — G4733 Obstructive sleep apnea (adult) (pediatric): Secondary | ICD-10-CM | POA: Diagnosis not present

## 2021-08-28 ENCOUNTER — Ambulatory Visit: Payer: PPO | Admitting: Cardiology

## 2021-08-28 ENCOUNTER — Other Ambulatory Visit: Payer: Self-pay

## 2021-09-03 DIAGNOSIS — I1310 Hypertensive heart and chronic kidney disease without heart failure, with stage 1 through stage 4 chronic kidney disease, or unspecified chronic kidney disease: Secondary | ICD-10-CM | POA: Diagnosis not present

## 2021-09-03 DIAGNOSIS — I4891 Unspecified atrial fibrillation: Secondary | ICD-10-CM | POA: Diagnosis not present

## 2021-09-03 DIAGNOSIS — R059 Cough, unspecified: Secondary | ICD-10-CM | POA: Diagnosis not present

## 2021-09-03 DIAGNOSIS — N1831 Chronic kidney disease, stage 3a: Secondary | ICD-10-CM | POA: Diagnosis not present

## 2021-09-03 DIAGNOSIS — Z1152 Encounter for screening for COVID-19: Secondary | ICD-10-CM | POA: Diagnosis not present

## 2021-09-03 DIAGNOSIS — R0981 Nasal congestion: Secondary | ICD-10-CM | POA: Diagnosis not present

## 2021-09-03 DIAGNOSIS — J029 Acute pharyngitis, unspecified: Secondary | ICD-10-CM | POA: Diagnosis not present

## 2021-09-03 DIAGNOSIS — J069 Acute upper respiratory infection, unspecified: Secondary | ICD-10-CM | POA: Diagnosis not present

## 2021-09-03 DIAGNOSIS — I5020 Unspecified systolic (congestive) heart failure: Secondary | ICD-10-CM | POA: Diagnosis not present

## 2021-09-07 ENCOUNTER — Ambulatory Visit (HOSPITAL_COMMUNITY)
Admit: 2021-09-07 | Discharge: 2021-09-07 | Disposition: A | Discharge status: Home / Self Care | Payer: PPO | Attending: Physician Assistant | Admitting: Physician Assistant

## 2021-09-07 ENCOUNTER — Other Ambulatory Visit: Payer: Self-pay

## 2021-09-07 ENCOUNTER — Encounter (HOSPITAL_COMMUNITY): Payer: Self-pay | Admitting: Physician Assistant

## 2021-09-07 VITALS — BP 122/76 | HR 69 | Ht 67.0 in | Wt 193.0 lb

## 2021-09-07 DIAGNOSIS — D6869 Other thrombophilia: Secondary | ICD-10-CM | POA: Diagnosis not present

## 2021-09-07 DIAGNOSIS — Z79899 Other long term (current) drug therapy: Secondary | ICD-10-CM | POA: Diagnosis not present

## 2021-09-07 DIAGNOSIS — I110 Hypertensive heart disease with heart failure: Secondary | ICD-10-CM | POA: Diagnosis not present

## 2021-09-07 DIAGNOSIS — I4819 Other persistent atrial fibrillation: Secondary | ICD-10-CM | POA: Diagnosis not present

## 2021-09-07 DIAGNOSIS — G4733 Obstructive sleep apnea (adult) (pediatric): Secondary | ICD-10-CM | POA: Diagnosis not present

## 2021-09-07 DIAGNOSIS — I2510 Atherosclerotic heart disease of native coronary artery without angina pectoris: Secondary | ICD-10-CM | POA: Diagnosis not present

## 2021-09-07 DIAGNOSIS — I5042 Chronic combined systolic (congestive) and diastolic (congestive) heart failure: Secondary | ICD-10-CM | POA: Diagnosis not present

## 2021-09-07 DIAGNOSIS — E785 Hyperlipidemia, unspecified: Secondary | ICD-10-CM | POA: Insufficient documentation

## 2021-09-07 DIAGNOSIS — Z7901 Long term (current) use of anticoagulants: Secondary | ICD-10-CM | POA: Insufficient documentation

## 2021-09-07 DIAGNOSIS — Z86718 Personal history of other venous thrombosis and embolism: Secondary | ICD-10-CM | POA: Diagnosis not present

## 2021-09-07 DIAGNOSIS — Z9989 Dependence on other enabling machines and devices: Secondary | ICD-10-CM | POA: Insufficient documentation

## 2021-09-07 NOTE — Progress Notes (Signed)
Primary Care Physician: Crist Infante, MD Primary Cardiologist: Dr [name] Primary Electrophysiologist: Dr Curt Bears Referring Physician: Dr Caren Hazy is a 71 y.o. male with a history of chronic systolic heart failure, HTN, OSA, HLD, prior LV thrombus with resolution (2015) and paroxysmal atrial fibrillation who presents for follow up in the [facility].  The patient was initially diagnosed with atrial fibrillation remotely at a work event and appeared to be related to stress/dental procedures. Patient was at his PCP and found to be in rate controlled afib along with symptoms of fatigue and chest discomfort. He was restarted on Eliquis on 01/21/19 for a CHADS2VASC score of 4. He denies significant alcohol use. At his visit with Dr Claiborne Billings on 01/30/21 he was noted to be in rate controlled afib. He initially had an ablation attempt, but had a thrombus in his left atrial appendage on TEE 04/24/21.  He was switched to Xarelto.  A repeat imaging showed resolution of his thrombus.  On follow up today, patient is s/p afib ablation 08/10/21. He did present to the ED 08/11/21 with acute chest pain and fluid overload. Felt to be post ablation pericarditis, treated with colchicine and ibuprofen. He reports that he has felt very well since then, no tachypalpitations. He has more energy. No further CP, swallowing pain, or groin issues.   Today, he denies symptoms of palpitations, chest pain, shortness of breath, orthopnea, PND, lower extremity edema, dizziness, presyncope, syncope, bleeding, or neurologic sequela. The patient is tolerating medications without difficulties and is otherwise without complaint today.    Atrial Fibrillation Risk Factors:  he does have symptoms of sleep apnea.  He is compliant with CPAP. he does not have a history of rheumatic fever. he does have a history of alcohol use. The patient does not have a history of early familial atrial fibrillation  or other arrhythmias.  he has a BMI of Body mass index is 30.23 kg/m.Marland Kitchen Filed Weights   09/07/21 1103  Weight: 87.5 kg     Family History  Problem Relation Age of Onset   Kidney Stones Mother    Hypertension Father    Heart attack Brother 38       had CABG   Hypertension Brother      Atrial Fibrillation Management history:  Previous antiarrhythmic drugs: none Previous cardioversions: none Previous ablations: 08/10/21 CHADS2VASC score: 4 Anticoagulation history: Eliquis   Past Medical History:  Diagnosis Date   A-fib ([place]) 09/20/2013   Anxiety disorder 12/19/2009   Arthritis of knee, degenerative 12/19/2009   BP (high blood pressure) 12/19/2009   Chronic combined systolic and diastolic CHF (congestive heart failure) ([place])    a. MV/LHC 2019 EF 30-45%; b.Echo 2020 EF 30-35%, mod red LVSF, mod LAE, mild aortic root dialtion, 40 mm; c.TEE 9/22 EF 20-25%, red LVF, LAA thrombus, severe LAE, mild MR/AR, mild plaque des aorta; d. TEE 10/22 EF 30%, dec LVF, RVSF, sev LAE, mod RAE, no evidence of LAA thrombus, no aortic root dilation.   Clinical depression 12/19/2009   Coronary artery disease, non-occlusive    a. abnormal MV 2019, EF 30-43%; b. LHC 03/2018 [place] lesion is 40% stenosed. Otherwise minimal CAD, EF 45%.   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   Elevated fasting blood sugar 12/19/2009   GERD (gastroesophageal reflux disease)    HLD (hyperlipidemia) 12/19/2009   HTN (hypertension)    Sleep apnea  Past Surgical History:  Procedure Laterality Date   ATRIAL FIBRILLATION ABLATION N/A 08/10/2021   Procedure: ATRIAL FIBRILLATION ABLATION;  Surgeon: Constance Haw, MD;  Location: [place] [facility];  Service: Cardiovascular;  Laterality: N/A;   HERNIA REPAIR     KNEE SURGERY Left    LEFT HEART CATH AND CORONARY ANGIOGRAPHY N/A 04/15/2018   Procedure: LEFT HEART CATH AND CORONARY ANGIOGRAPHY;   Surgeon: Leonie Man, MD;  Location: [place] [facility];  Service: Cardiovascular;  Laterality: N/A;   TEE WITHOUT CARDIOVERSION N/A 04/24/2021   Procedure: TRANSESOPHAGEAL ECHOCARDIOGRAM (TEE);  Surgeon: Lelon Perla, MD;  Location: [facility];  Service: Cardiovascular;  Laterality: N/A;   TEE WITHOUT CARDIOVERSION N/A 06/04/2021   Procedure: TRANSESOPHAGEAL ECHOCARDIOGRAM (TEE);  Surgeon: Elouise Munroe, MD;  Location: [place];  Service: Cardiology;  Laterality: N/A;   TONSILLECTOMY     TOTAL HIP ARTHROPLASTY Right 06/14/2020   Procedure: TOTAL HIP ARTHROPLASTY ANTERIOR APPROACH;  Surgeon: Gaynelle Arabian, MD;  Location: [facility];  Service: Orthopedics;  Laterality: Right;   WISDOM TOOTH EXTRACTION      Current Outpatient Medications  Medication Sig Dispense Refill   amLODipine (NORVASC) 5 MG tablet TAKE 1 TABLET(5 MG) BY MOUTH DAILY 90 tablet 3   buPROPion (WELLBUTRIN XL) 150 MG 24 hr tablet Take 150 mg by mouth daily.     doxycycline (VIBRA-TABS) 100 MG tablet Take 100 mg by mouth 2 (two) times daily.     ezetimibe (ZETIA) 10 MG tablet Take 10 mg by mouth daily.     ibuprofen (ADVIL) 800 MG tablet Take 1 tablet (800 mg total) by mouth 2 (two) times daily. 6 tablet 0   metoprolol succinate (TOPROL-XL) 100 MG 24 hr tablet TAKE 1 TABLET BY MOUTH DAILY WITH OR IMMEDIATELY FOLLOWING A MEAL 90 tablet 1   rivaroxaban (XARELTO) 20 MG TABS tablet Take 1 tablet (20 mg total) by mouth daily with supper. 30 tablet 6   sacubitril-valsartan (ENTRESTO) 97-103 MG Take 1 tablet by mouth 2 (two) times daily. 60 tablet 6   sertraline (ZOLOFT) 100 MG tablet Take 100 mg by mouth daily.      No current facility-administered medications for this encounter.    Allergies  Allergen Reactions   Apixaban     Other reaction(s): failed this with clot in heart. resolved on xarelto   Atorvastatin     Other reaction(s): felt bad, draggy, rough. Other reaction(s): felt bad, draggy, rough.    Celecoxib Itching and Dermatitis    Other reaction(s): dermatitis   Pravastatin     Other reaction(s): made him feel bad. Other reaction(s): made him feel bad.    Social History   Socioeconomic History   Marital status: Married    Spouse name: Not on file   Number of children: Not on file   Years of education: Not on file   Highest education level: Not on file  Occupational History   Occupation: retired  Tobacco Use   Smoking status: Former    Types: Cigars   Smokeless tobacco: Never   Tobacco comments:    Quit about 3-4 months ago   Scientific laboratory technician Use: Never used  Substance and Sexual Activity   Alcohol use: No   Drug use: No   Sexual activity: Not on file  Other Topics Concern   Not on file  Social History Narrative   Not on file   Social Determinants of Health   Financial Resource Strain: Not  on file  Food Insecurity: Not on file  Transportation Needs: Not on file  Physical Activity: Not on file  Stress: Not on file  Social Connections: Not on file  Intimate Partner Violence: Not on file     ROS- All systems are reviewed and negative except as per the HPI above.  Physical Exam: Vitals:   09/07/21 1103  BP: 122/76  Pulse: 69  Weight: 87.5 kg  Height: 5\' 7"  (1.702 m)    GEN- The patient is a well appearing male, alert and oriented x 3 today.   HEENT-head normocephalic, atraumatic, sclera clear, conjunctiva pink, hearing intact, trachea midline. Lungs- Clear to ausculation bilaterally, normal work of breathing Heart- Regular rate and rhythm, no murmurs, rubs or gallops  GI- soft, NT, ND, + BS Extremities- no clubbing, cyanosis, or edema MS- no significant deformity or atrophy Skin- no rash or lesion Psych- euthymic mood, full affect Neuro- strength and sensation are intact   Wt Readings from Last 3 Encounters:  09/07/21 87.5 kg  08/10/21 88.5 kg  08/08/21 86.9 kg    EKG today demonstrates  SR Vent. rate 69 BPM PR interval 194 ms QRS  duration 104 ms QT/QTcB 436/467 ms  Echo 02/20/21 demonstrated   1. Left ventricular ejection fraction, by estimation, is 30 to 35%. Left  ventricular ejection fraction by PLAX is 33 %. The left ventricle has  moderately decreased function. The left ventricle demonstrates global  hypokinesis. The left ventricular internal cavity size was mildly dilated. Left ventricular diastolic function could not be evaluated.   2. Right ventricular systolic function is mildly reduced. The right  ventricular size is normal. There is normal pulmonary artery systolic  pressure. The estimated right ventricular systolic pressure is 56.2 mmHg.   3. Left atrial size was severely dilated.   4. The mitral valve is abnormal. Mild mitral valve regurgitation.   5. The aortic valve is tricuspid. Aortic valve regurgitation is mild.   6. Aortic dilatation noted. There is mild dilatation of the aortic root,  measuring 39 mm.   7. The inferior vena cava is normal in size with greater than 50%  respiratory variability, suggesting right atrial pressure of 3 mmHg.   Comparison(s): Changes from prior study are noted. 03/04/2019: LVEF 30-35%, global HK.   Epic records are reviewed at length today  CHA2DS2-VASc Score = 4  The patient's score is based upon: CHF History: 1 HTN History: 1 Diabetes History: 0 Stroke History: 0 Vascular Disease History: 1 Age Score: 1 Gender Score: 0       ASSESSMENT AND PLAN: 1. Persistent Atrial Fibrillation (ICD10:  I48.19) The patient's CHA2DS2-VASc score is 4, indicating a 4.8% annual risk of stroke.   S/p afib ablation 08/10/21 Patient appears to be maintaining SR. Continue Xarelto 20 mg daily with no missed doses for 3 months post ablation. Continue Toprol 100 mg daily  2. Secondary Hypercoagulable State (ICD10:  D68.69) The patient is at significant risk for stroke/thromboembolism based upon his CHA2DS2-VASc Score of 4.  Continue Rivaroxaban (Xarelto).   3. OSA Followed by  Dr Claiborne Billings. Encouraged compliance with CPAP therapy.  4. Combined systolic and diastolic CHF EF 13-08%  No signs or symptoms of fluid overload. If EF not improved, may need ICD, briefly discussed today.  5. HTN Stable, no changes today.  6. CAD Non obstructive on [facility] 03/2018.  No anginal symptoms.   Follow up with Dr [name] and Dr Curt Bears as scheduled.    Ricky Milt Coye PA-C Afib  [facility] [address] [place], [place] [address] [phone] 09/07/2021 11:23 AM

## 2021-09-26 ENCOUNTER — Telehealth (HOSPITAL_BASED_OUTPATIENT_CLINIC_OR_DEPARTMENT_OTHER): Payer: Self-pay | Admitting: Cardiology

## 2021-09-26 NOTE — Telephone Encounter (Signed)
?*  STAT* If patient is at the pharmacy, call can be transferred to refill team. ? ? ?1. Which medications need to be refilled? (please list name of each medication and dose if known) new prescription Amlodipine 10 mg- he said he was told to take 2 5 mg a day ? ?2. Which pharmacy/location (including street and city if local pharmacy) is medication to be sent to?[facility] [place], [place] ? ?3. Do they need a 30 day or 90 day supply? 90 days and refills ? ?

## 2021-09-27 MED ORDER — AMLODIPINE BESYLATE 5 MG PO TABS: ORAL_TABLET | ORAL | 3 refills | Status: DC

## 2021-10-11 NOTE — Progress Notes (Signed)
?Cardiology Office Note:   ? ?Date:  10/12/2021  ? ?ID:  Juan Montes, DOB [birth year], MRN [id number] ? ?PCP:  Crist Infante, MD  ?Cardiologist:  Buford Dresser, MD PhD ? ?Referring MD: Crist Infante, MD  ? ?CC: follow up ? ?History of Present Illness:   ? ?Juan Montes is a 71 y.o. male with a hx of nonischemic cardiomyopathy, chronic systolic and diastolic heart failure, HTN, HLD, atrial fibrillation, prior LV thrombus with resolution (2015),  who is seen for the evaluation and management of his cardiac conditions. He was last seen by Rosaria Ferries on 05/14/18 and previously by Dr. Johnsie Cancel in 2015. ? ?Today: ?Since his last appointment he underwent atrial fibrillation ablation on 08/10/2021. The next day he presented to the ED with sudden onset chest pain that morning. Imagine showed mild CHF. Initial troponin elevated greater than 1500. Diagnosed with pericarditis. ? ?Overall, he is feeling much better since his ablation and ED visit. Lately he has not felt like there was any recurrent arrhythmia. Now he is able to be more active, he no longer has symptoms when walking down his driveway.  ? ?Prior to his ablation, his at home blood pressures were consistently high, 140s/90s and sometimes in the 150s. He was instructed to increase amlodipine to 10 mg, which brought his blood pressure under control. ? ?He denies any palpitations, chest pain, shortness of breath, or peripheral edema. No lightheadedness, headaches, syncope, orthopnea, or PND. ? ? ? ?Past Medical History:  ?Diagnosis Date  ? A-fib ([place]) 09/20/2013  ? Anxiety disorder 12/19/2009  ? Arthritis of knee, degenerative 12/19/2009  ? BP (high blood pressure) 12/19/2009  ? Chronic combined systolic and diastolic CHF (congestive heart failure) ([place])   ? a. MV/LHC 2019 EF 30-45%; b.Echo 2020 EF 30-35%, mod red LVSF, mod LAE, mild aortic root dialtion, 40 mm; c.TEE 9/22 EF 20-25%, red LVF, LAA thrombus, severe LAE, mild MR/AR, mild plaque des aorta; d. TEE  10/22 EF 30%, dec LVF, RVSF, sev LAE, mod RAE, no evidence of LAA thrombus, no aortic root dilation.  ? Clinical depression 12/19/2009  ? Coronary artery disease, non-occlusive   ? a. abnormal MV 2019, EF 30-43%; b. LHC 03/2018 Lat Ramus lesion is 40% stenosed. Otherwise minimal CAD, EF 45%.  ? Depression   ? Dermatologic disease 09/02/2011  ? 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 Elevated fasting blood sugar 12/19/2009  ? GERD (gastroesophageal reflux disease)   ? HLD (hyperlipidemia) 12/19/2009  ? HTN (hypertension)   ? Sleep apnea   ? ? ?Past Surgical History:  ?Procedure Laterality Date  ? ATRIAL FIBRILLATION ABLATION N/A 08/10/2021  ? Procedure: ATRIAL FIBRILLATION ABLATION;  Surgeon: Constance Haw, MD;  Location: [facility];  Service: Cardiovascular;  Laterality: N/A;  ? HERNIA REPAIR    ? KNEE SURGERY Left   ? LEFT HEART CATH AND CORONARY ANGIOGRAPHY N/A 04/15/2018  ? Procedure: LEFT HEART CATH AND CORONARY ANGIOGRAPHY;  Surgeon: Leonie Man, MD;  Location: [place] [facility];  Service: Cardiovascular;  Laterality: N/A;  ? TEE WITHOUT CARDIOVERSION N/A 04/24/2021  ? Procedure: TRANSESOPHAGEAL ECHOCARDIOGRAM (TEE);  Surgeon: Lelon Perla, MD;  [facility];  Service: Cardiovascular;  Laterality: N/A;  ? TEE WITHOUT CARDIOVERSION N/A 06/04/2021  ? Procedure: TRANSESOPHAGEAL ECHOCARDIOGRAM (TEE);  Surgeon: Elouise Munroe, MD;  Location: [facility];  Service: Cardiology;  Laterality: N/A;  ? TONSILLECTOMY    ? TOTAL HIP ARTHROPLASTY Right 06/14/2020  ? Procedure:  TOTAL HIP ARTHROPLASTY ANTERIOR APPROACH;  Surgeon: Gaynelle Arabian, MD;  Location: [facility];  Service: Orthopedics;  Laterality: Right;  ? WISDOM TOOTH EXTRACTION    ? ? ?Current Medications: ?Current Outpatient Medications on File Prior to Visit  ?Medication Sig  ? buPROPion (WELLBUTRIN XL) 150 MG 24 hr tablet Take 150 mg by mouth daily.  ? doxycycline (VIBRA-TABS) 100 MG tablet Take 100 mg by mouth  2 (two) times daily.  ? ezetimibe (ZETIA) 10 MG tablet Take 10 mg by mouth daily.  ? ibuprofen (ADVIL) 800 MG tablet Take 1 tablet (800 mg total) by mouth 2 (two) times daily.  ? metoprolol succinate (TOPROL-XL) 100 MG 24 hr tablet TAKE 1 TABLET BY MOUTH DAILY WITH OR IMMEDIATELY FOLLOWING A MEAL  ? rivaroxaban (XARELTO) 20 MG TABS tablet Take 1 tablet (20 mg total) by mouth daily with supper.  ? sacubitril-valsartan (ENTRESTO) 97-103 MG Take 1 tablet by mouth 2 (two) times daily.  ? sertraline (ZOLOFT) 100 MG tablet Take 100 mg by mouth daily.   ? ?No current facility-administered medications on file prior to visit.  ?  ? ?Allergies:   Apixaban, Atorvastatin, Celecoxib, and Pravastatin  ? ?Social History  ? ?Tobacco Use  ? Smoking status: Former  ?  Types: Cigars  ? Smokeless tobacco: Never  ? Tobacco comments:  ?  Quit about 3-4 months ago   ?Vaping Use  ? Vaping Use: Never used  ?Substance Use Topics  ? Alcohol use: No  ? Drug use: No  ?  ? ? ?Family History: ?[name] includes Heart attack (age of onset: 60) in his brother; Hypertension in his brother and father; Kidney Stones in his mother. Father has severe CAD, med mgmt only option. ? ?ROS:   ?Please see the history of present illness. ?All other systems are reviewed and negative.  ? ? ?EKGs/Labs/Other Studies Reviewed:   ? ?The following studies were reviewed today: ? ?Echo 08/11/2021: ?1. Left ventricular ejection fraction, by estimation, is 30 to 35%. The  ?left ventricle has moderately decreased function. The left ventricle  ?demonstrates global hypokinesis. There is mild concentric left ventricular  ?hypertrophy. Indeterminate diastolic  ?filling due to E-A fusion.  ? 2. Right ventricular systolic function is normal. The right ventricular  ?size is normal. Tricuspid regurgitation signal is inadequate for assessing  ?PA pressure.  ? 3. Left atrial size was severely dilated.  ? 4. The pericardial effusion is surrounding the apex but  trivial.  ? 5. The mitral valve is abnormal. 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 This study does not clearly show  ?mitral annular disjunction. No evidence of mitral valve regurgitation.  ? 6. The aortic valve was not well visualized. Aortic valve regurgitation  ?is mild. No aortic stenosis is present.  ? ?Comparison(s): No significant change from prior study.  ? ?Atrial Fibrillation Ablation 08/10/2021: ?CONCLUSIONS: ?1. Atrial fibrillation upon presentation.   ?2. Successful electrical isolation and anatomical encircling of all four pulmonary veins with radiofrequency current.  A WACA approach was used ?3. Additional left atrial ablation was performed with a standard box lesion created along the posterior Lisowski of the left atrium ?4. Atrial fibrillation successfully cardioverted to sinus rhythm. ?5. No early apparent complications. ? ?Cardiac CTA 08/07/2021: ?FINDINGS: ?A 120 kV prospective scan was triggered in the descending thoracic ?aorta at 111 HU's. Gantry rotation speed was 280 msecs and ?collimation was .9 mm. No beta blockade and no NTG was given.  The 3D ?data set was reconstructed in 5% intervals of the 60-80 % of the R-R ?cycle. Diastolic phases were analyzed on a dedicated work station ?using MPR, MIP and VRT modes. The patient received 80 cc of ?contrast. ?  ?Left atrium: Severely dilated. Filling defect at tip of appendage on ?initial images, but appears to fill on delayed images (increase in ?HU from 40 to 80), suggesting slow filling but no thrombus in the ?left atrium or left atrial appendage. ?  ?Left ventricle: Normal in size. ?  ?Right atrium: Moderately dilated ?  ?Right ventricle: Mildly enlarged ?  ?Pericardium: Normal thickness ?  ?Pulmonary veins: ?  ?Left superior pulmonary vein: Measures 2.5cm x 1.6cm in diameter ?with area 3.3cm^2. ?  ?Left inferior pulmonary vein: Measures 2.0cm x 1.1cm in diameter ?with area 1.7cm^2. ?  ?Right superior  pulmonary vein: Measures 2.2cm x 1.8cm in diameter ?with area 3.1cm^2. ?  ?Right inferior pulmonary vein: Measures 2.4cm x 2.1cm in diameter ?with area 3.8cm^2. ?  ?Coronary Arteries: Normal coronary origin. Right

## 2021-10-12 ENCOUNTER — Other Ambulatory Visit: Payer: Self-pay

## 2021-10-12 ENCOUNTER — Encounter (HOSPITAL_BASED_OUTPATIENT_CLINIC_OR_DEPARTMENT_OTHER): Payer: Self-pay | Admitting: Cardiology

## 2021-10-12 ENCOUNTER — Ambulatory Visit (HOSPITAL_BASED_OUTPATIENT_CLINIC_OR_DEPARTMENT_OTHER): Payer: PPO | Admitting: Cardiology

## 2021-10-12 VITALS — BP 110/74 | HR 67 | Ht 67.0 in | Wt 200.1 lb

## 2021-10-12 DIAGNOSIS — D6869 Other thrombophilia: Secondary | ICD-10-CM

## 2021-10-12 DIAGNOSIS — I10 Essential (primary) hypertension: Secondary | ICD-10-CM | POA: Diagnosis not present

## 2021-10-12 DIAGNOSIS — I480 Paroxysmal atrial fibrillation: Secondary | ICD-10-CM

## 2021-10-12 DIAGNOSIS — E7800 Pure hypercholesterolemia, unspecified: Secondary | ICD-10-CM | POA: Diagnosis not present

## 2021-10-12 DIAGNOSIS — I428 Other cardiomyopathies: Secondary | ICD-10-CM | POA: Diagnosis not present

## 2021-10-12 DIAGNOSIS — I2510 Atherosclerotic heart disease of native coronary artery without angina pectoris: Secondary | ICD-10-CM | POA: Diagnosis not present

## 2021-10-12 MED ORDER — AMLODIPINE BESYLATE 10 MG PO TABS: 10.0000 mg | ORAL_TABLET | Freq: Every day | ORAL | 3 refills | Status: DC

## 2021-10-12 NOTE — Patient Instructions (Signed)
Medication Instructions:  Your physician recommends that you continue on your current medications as directed. Please refer to the Current Medication list given to you today.  *If you need a refill on your cardiac medications before your next appointment, please call your pharmacy*  Lab Work: NONE  Testing/Procedures: NONE  Follow-Up: At [facility], you and your health needs are our priority.  As part of our continuing mission to provide you with exceptional heart care, we have created designated Provider Care Teams.  These Care Teams include your primary Cardiologist (physician) and Advanced Practice Providers (APPs -  Physician Assistants and Nurse Practitioners) who all work together to provide you with the care you need, when you need it.  We recommend signing up for the patient portal called "MyChart".  Sign up information is provided on this After Visit Summary.  MyChart is used to connect with patients for Virtual Visits (Telemedicine).  Patients are able to view lab/test results, encounter notes, upcoming appointments, etc.  Non-urgent messages can be sent to your provider as well.   To learn more about what you can do with MyChart, go to [email or web address].    Your next appointment:   6 month(s)  The format for your next appointment:   In Person  Provider:   Bridgette Christopher, MD     

## 2021-11-06 ENCOUNTER — Encounter: Payer: Self-pay | Admitting: Cardiology

## 2021-11-06 ENCOUNTER — Ambulatory Visit: Payer: PPO | Admitting: Cardiology

## 2021-11-06 VITALS — BP 122/82 | HR 73 | Ht 67.0 in | Wt 196.8 lb

## 2021-11-06 DIAGNOSIS — I5042 Chronic combined systolic (congestive) and diastolic (congestive) heart failure: Secondary | ICD-10-CM | POA: Diagnosis not present

## 2021-11-06 DIAGNOSIS — G4733 Obstructive sleep apnea (adult) (pediatric): Secondary | ICD-10-CM | POA: Diagnosis not present

## 2021-11-06 DIAGNOSIS — I4819 Other persistent atrial fibrillation: Secondary | ICD-10-CM

## 2021-11-06 NOTE — Progress Notes (Signed)
? ?Electrophysiology Office Note ? ? ?Date:  11/06/2021  ? ?ID:  Juan Montes, DOB 1951/04/14, MRN [id number] ? ?PCP:  Juan Infante, MD  ?Cardiologist:  [name] ?Primary Electrophysiologist:  Nyjah Denio Meredith Leeds, MD   ? ?Chief Complaint: AF ?  ?History of Present Illness: ?Juan Montes is a 71 y.o. male who is being seen today for the evaluation of AF at the request of Juan Infante, MD. Presenting today for electrophysiology evaluation. ? ?He has a history significant for chronic systolic heart failure, hypertension, OSA, hyperlipidemia, persistent atrial fibrillation.  He is now status post atrial fibrillation ablation on 08/10/2021.  Repeat echo after his ablation showed a persistently low ejection fraction at 30 to 35%. ? ?Today, denies symptoms of palpitations, chest pain, shortness of breath, orthopnea, PND, lower extremity edema, claudication, dizziness, presyncope, syncope, bleeding, or neurologic sequela. The patient is tolerating medications without difficulties.  Since his ablation he has done well.  He is noted no further episodes of atrial fibrillation.  He feels much improved with more energy and less fatigue. ? ? ?Past Medical History:  ?Diagnosis Date  ? A-fib ([place]) 09/20/2013  ? Anxiety disorder 12/19/2009  ? Arthritis of knee, degenerative 12/19/2009  ? BP (high blood pressure) 12/19/2009  ? Chronic combined systolic and diastolic CHF (congestive heart failure) ([place])   ? a. MV/LHC 2019 EF 30-45%; b.Echo 2020 EF 30-35%, mod red LVSF, mod LAE, mild aortic root dialtion, 40 mm; c.TEE 9/22 EF 20-25%, red LVF, LAA thrombus, severe LAE, mild MR/AR, mild plaque des aorta; d. TEE 10/22 EF 30%, dec LVF, RVSF, sev LAE, mod RAE, no evidence of LAA thrombus, no aortic root dilation.  ? Clinical depression 12/19/2009  ? Coronary artery disease, non-occlusive   ? a. abnormal MV 2019, EF 30-43%; b. LHC 03/2018 Lat Ramus lesion is 40% stenosed. Otherwise minimal CAD, EF 45%.  ? Depression   ? Dermatologic  disease 09/02/2011  ? 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 Elevated fasting blood sugar 12/19/2009  ? GERD (gastroesophageal reflux disease)   ? HLD (hyperlipidemia) 12/19/2009  ? HTN (hypertension)   ? Sleep apnea   ? ?Past Surgical History:  ?Procedure Laterality Date  ? ATRIAL FIBRILLATION ABLATION N/A 08/10/2021  ? Procedure: ATRIAL FIBRILLATION ABLATION;  Surgeon: Constance Haw, MD;  Location: [place] [facility];  Service: Cardiovascular;  Laterality: N/A;  ? HERNIA REPAIR    ? KNEE SURGERY Left   ? LEFT HEART CATH AND CORONARY ANGIOGRAPHY N/A 04/15/2018  ? Procedure: LEFT HEART CATH AND CORONARY ANGIOGRAPHY;  Surgeon: Leonie Man, MD;  Location: [place] [facility];  Service: Cardiovascular;  Laterality: N/A;  ? TEE WITHOUT CARDIOVERSION N/A 04/24/2021  ? Procedure: TRANSESOPHAGEAL ECHOCARDIOGRAM (TEE);  Surgeon: Lelon Perla, MD;  Location: [facility] ENDOSCOPY;  Service: Cardiovascular;  Laterality: N/A;  ? TEE WITHOUT CARDIOVERSION N/A 06/04/2021  ? Procedure: TRANSESOPHAGEAL ECHOCARDIOGRAM (TEE);  Surgeon: Elouise Munroe, MD;  Location: [place];  Service: Cardiology;  Laterality: N/A;  ? TONSILLECTOMY    ? TOTAL HIP ARTHROPLASTY Right 06/14/2020  ? Procedure: TOTAL HIP ARTHROPLASTY ANTERIOR APPROACH;  Surgeon: Gaynelle Arabian, MD;  [facility]: [facility];  Service: Orthopedics;  Laterality: Right;  ? WISDOM TOOTH EXTRACTION    ? ? ? ?Current Outpatient Medications  ?Medication Sig Dispense Refill  ? amLODipine (NORVASC) 10 MG tablet Take 1 tablet (10 mg total) by mouth daily. 90 tablet 3  ? buPROPion (WELLBUTRIN XL) 150 MG 24 hr tablet Take  150 mg by mouth daily.    ? doxycycline (VIBRA-TABS) 100 MG tablet Take 100 mg by mouth 2 (two) times daily.    ? ezetimibe (ZETIA) 10 MG tablet Take 10 mg by mouth daily.    ? ibuprofen (ADVIL) 800 MG tablet Take 1 tablet (800 mg total) by mouth 2 (two) times daily. 6 tablet 0  ? metoprolol succinate (TOPROL-XL) 100 MG 24 hr tablet TAKE 1  TABLET BY MOUTH DAILY WITH OR IMMEDIATELY FOLLOWING A MEAL 90 tablet 1  ? rivaroxaban (XARELTO) 20 MG TABS tablet Take 1 tablet (20 mg total) by mouth daily with supper. 30 tablet 6  ? sacubitril-valsartan (ENTRESTO) 97-103 MG Take 1 tablet by mouth 2 (two) times daily. 60 tablet 6  ? sertraline (ZOLOFT) 100 MG tablet Take 100 mg by mouth daily.     ? ?No current facility-administered medications for this visit.  ? ? ?Allergies:   Apixaban, Atorvastatin, Celecoxib, and Pravastatin  ? ?Social History:  The patient  reports that he has quit smoking. His smoking use included cigars. He has never used smokeless tobacco. He reports that he does not drink alcohol and does not use drugs.  ? ?Family History:  [name] includes Heart attack (age of onset: 5) in his brother; Hypertension in his brother and father; Kidney Stones in his mother.  ? ?ROS:  Please see the history of present illness.   Otherwise, review of systems is positive for none.   All other systems are reviewed and negative.  ? ?PHYSICAL EXAM: ?VS:  BP 122/82   Pulse 73   Ht '5\' 7"'$  (1.702 m)   Wt 196 lb 12.8 oz (89.3 kg)   SpO2 96%   BMI 30.82 kg/m?  , BMI Body mass index is 30.82 kg/m?. ?GEN: Well nourished, well developed, in no acute distress  ?HEENT: normal  ?Neck: no JVD, carotid bruits, or masses ?Cardiac: RRR; no murmurs, rubs, or gallops,no edema  ?Respiratory:  clear to auscultation bilaterally, normal work of breathing ?GI: soft, nontender, nondistended, + BS ?MS: no deformity or atrophy  ?Skin: warm and dry ?Neuro:  Strength and sensation are intact ?Psych: euthymic mood, full affect ? ?EKG:  EKG is ordered today. ?Personal review of the ekg ordered shows sinus rhythm  ? ?Recent Labs: ?01/31/2021: ALT 12; Magnesium 2.0; TSH 2.750 ?08/11/2021: B Natriuretic Peptide 471.4; BUN 11; Creatinine, Ser 1.01; Hemoglobin 14.4; Platelets 143; Potassium 3.7; Sodium 134  ? ? ?Lipid Panel  ?   ?Component Value Date/Time  ? CHOL 158  01/31/2021 0915  ? TRIG 99 01/31/2021 0915  ? HDL 55 01/31/2021 0915  ? CHOLHDL 2.9 01/31/2021 0915  ? [place] 85 01/31/2021 0915  ? ? ? ?Wt Readings from Last 3 Encounters:  ?11/06/21 196 lb 12.8 oz (89.3 kg)  ?10/12/21 200 lb 1.6 oz (90.8 kg)  ?09/07/21 193 lb (87.5 kg)  ?  ? ? ?Other studies Reviewed: ?Additional studies/ records that were reviewed today include: TTE 02/20/21  ?Review of the above records today demonstrates:  ? 1. Left ventricular ejection fraction, by estimation, is 30 to 35%. Left  ?ventricular ejection fraction by PLAX is 33 %. The left ventricle has  ?moderately decreased function. The left ventricle demonstrates global  ?hypokinesis. The left ventricular  ?internal cavity size was mildly dilated. Left ventricular diastolic  ?function could not be evaluated.  ? 2. Right ventricular systolic function is mildly reduced. The right  ?ventricular size is normal. There is normal pulmonary artery  systolic  ?pressure. The estimated right ventricular systolic pressure is 60.4 mmHg.  ? 3. Left atrial size was severely dilated.  ? 4. The mitral valve is abnormal. Mild mitral valve regurgitation.  ? 5. The aortic valve is tricuspid. Aortic valve regurgitation is mild.  ? 6. Aortic dilatation noted. There is mild dilatation of the aortic root,  ?measuring 39 mm.  ? 7. The inferior vena cava is normal in size with greater than 50%  ?respiratory variability, suggesting right atrial pressure of 3 mmHg. ? ? ?ASSESSMENT AND PLAN: ? ?1.  Persistent atrial fibrillation: Currently on Xarelto 20 mg daily, Toprol-XL 100 mg daily.  He had a thrombus in his left atrial appendage and thus his initial ablation attempt was canceled.  He is now status post atrial fibrillation ablation 08/10/2021.  He is remained in sinus rhythm.  We Stacey Maura continue with current management. ?  ? ? ?2.  Obstructive sleep apnea: CPAP compliance encouraged ? ?3.  Chronic systolic and diastolic heart failure: Ejection fraction 30 to 35%.   Currently on optimal medical therapy.  His ejection fraction remains low.  He would benefit from ICD therapy.  We have discussed the procedure.  He would like to discuss this further with his wife.  He Berkeley Veldman get back to [place] with a

## 2021-11-06 NOTE — Patient Instructions (Signed)
Medication Instructions:  ?Your physician recommends that you continue on your current medications as directed. Please refer to the Current Medication list given to you today. ?*If you need a refill on your cardiac medications before your next appointment, please call your pharmacy* ? ?Lab Work: ?None. ?If you have labs (blood work) drawn today and your tests are completely normal, you will receive your results only by: ?MyChart Message (if you have MyChart) OR ?A paper copy in the mail ?If you have any lab test that is abnormal or we need to change your treatment, we will call you to review the results. ? ?Testing/Procedures: ?None. ? ?Follow-Up: ?At [facility], you and your health needs are our priority.  As part of our continuing mission to provide you with exceptional heart care, we have created designated Provider Care Teams.  These Care Teams include your primary Cardiologist (physician) and Advanced Practice Providers (APPs -  Physician Assistants and Nurse Practitioners) who all work together to provide you with the care you need, when you need it. ? ?Your physician wants you to follow-up in: 3 months with  one of the following Advanced Practice Providers on your designated Care Team:   ? ?Tommye Standard, PA-C ?Legrand Como "Jonni Sanger" [place], PA-C ?  You will receive a reminder letter in the mail two months in advance. If you don't receive a letter, please call our office to schedule the follow-up appointment. ? ?We recommend signing up for the patient portal called "MyChart".  Sign up information is provided on this After Visit Summary.  MyChart is used to connect with patients for Virtual Visits (Telemedicine).  Patients are able to view lab/test results, encounter notes, upcoming appointments, etc.  Non-urgent messages can be sent to your provider as well.   ?To learn more about what you can do with MyChart, go to [email or web address].   ? ?Any Other Special Instructions Will Be Listed Below (If  Applicable). ? ? ? ? ?  ? ? ?

## 2021-11-12 ENCOUNTER — Encounter (HOSPITAL_BASED_OUTPATIENT_CLINIC_OR_DEPARTMENT_OTHER): Payer: Self-pay | Admitting: Cardiology

## 2021-11-12 DIAGNOSIS — R067 Sneezing: Secondary | ICD-10-CM | POA: Diagnosis not present

## 2021-11-12 DIAGNOSIS — R059 Cough, unspecified: Secondary | ICD-10-CM | POA: Diagnosis not present

## 2021-11-12 DIAGNOSIS — Z1152 Encounter for screening for COVID-19: Secondary | ICD-10-CM | POA: Diagnosis not present

## 2021-11-12 DIAGNOSIS — J069 Acute upper respiratory infection, unspecified: Secondary | ICD-10-CM | POA: Diagnosis not present

## 2021-11-12 DIAGNOSIS — R0981 Nasal congestion: Secondary | ICD-10-CM | POA: Diagnosis not present

## 2021-11-12 DIAGNOSIS — R5383 Other fatigue: Secondary | ICD-10-CM | POA: Diagnosis not present

## 2021-12-09 ENCOUNTER — Other Ambulatory Visit: Payer: Self-pay | Admitting: Cardiology

## 2021-12-10 NOTE — Telephone Encounter (Signed)
Prescription refill request for Xarelto received.  ?Indication: Atrial Fib ?Last office visit: 11/06/21  Elliot Cousin MD ?Weight: 89.3kg ?Age: 71 ?Scr: 1.01 on 08/11/21 ?CrCl:  85.96 ? ?Based on above findings Xarelto '20mg'$  daily is the appropriate dose.  Refill approved. ? ?

## 2021-12-18 DIAGNOSIS — I5020 Unspecified systolic (congestive) heart failure: Secondary | ICD-10-CM | POA: Diagnosis not present

## 2021-12-18 DIAGNOSIS — I428 Other cardiomyopathies: Secondary | ICD-10-CM | POA: Diagnosis not present

## 2021-12-18 DIAGNOSIS — R7301 Impaired fasting glucose: Secondary | ICD-10-CM | POA: Diagnosis not present

## 2021-12-18 DIAGNOSIS — I4891 Unspecified atrial fibrillation: Secondary | ICD-10-CM | POA: Diagnosis not present

## 2021-12-18 DIAGNOSIS — N1831 Chronic kidney disease, stage 3a: Secondary | ICD-10-CM | POA: Diagnosis not present

## 2021-12-18 DIAGNOSIS — I10 Essential (primary) hypertension: Secondary | ICD-10-CM | POA: Diagnosis not present

## 2021-12-18 DIAGNOSIS — I1310 Hypertensive heart and chronic kidney disease without heart failure, with stage 1 through stage 4 chronic kidney disease, or unspecified chronic kidney disease: Secondary | ICD-10-CM | POA: Diagnosis not present

## 2021-12-18 DIAGNOSIS — D6869 Other thrombophilia: Secondary | ICD-10-CM | POA: Diagnosis not present

## 2021-12-18 DIAGNOSIS — F329 Major depressive disorder, single episode, unspecified: Secondary | ICD-10-CM | POA: Diagnosis not present

## 2021-12-18 DIAGNOSIS — E785 Hyperlipidemia, unspecified: Secondary | ICD-10-CM | POA: Diagnosis not present

## 2021-12-18 DIAGNOSIS — I2510 Atherosclerotic heart disease of native coronary artery without angina pectoris: Secondary | ICD-10-CM | POA: Diagnosis not present

## 2021-12-18 DIAGNOSIS — G720 Drug-induced myopathy: Secondary | ICD-10-CM | POA: Diagnosis not present

## 2021-12-26 DIAGNOSIS — I5020 Unspecified systolic (congestive) heart failure: Secondary | ICD-10-CM | POA: Diagnosis not present

## 2021-12-26 DIAGNOSIS — I1310 Hypertensive heart and chronic kidney disease without heart failure, with stage 1 through stage 4 chronic kidney disease, or unspecified chronic kidney disease: Secondary | ICD-10-CM | POA: Diagnosis not present

## 2021-12-31 ENCOUNTER — Other Ambulatory Visit: Payer: Self-pay | Admitting: Cardiology

## 2021-12-31 NOTE — Telephone Encounter (Signed)
Rx request sent to pharmacy.  

## 2022-01-01 ENCOUNTER — Telehealth (HOSPITAL_BASED_OUTPATIENT_CLINIC_OR_DEPARTMENT_OTHER): Payer: Self-pay | Admitting: Cardiology

## 2022-01-01 MED ORDER — ENTRESTO 97-103 MG PO TABS: 1.0000 | ORAL_TABLET | Freq: Two times a day (BID) | ORAL | 2 refills | Status: DC

## 2022-01-01 MED ORDER — METOPROLOL SUCCINATE ER 100 MG PO TB24: 100.0000 mg | ORAL_TABLET | Freq: Every day | ORAL | 2 refills | Status: DC

## 2022-01-01 MED ORDER — ENTRESTO 97-103 MG PO TABS: 1.0000 | ORAL_TABLET | Freq: Two times a day (BID) | ORAL | 11 refills | Status: DC

## 2022-01-01 NOTE — Telephone Encounter (Signed)
Rx(s) sent to pharmacy electronically.  

## 2022-01-01 NOTE — Telephone Encounter (Signed)
RN entered patient information into cover my meds to determine if prior auth was needed to help with cost of medication. Prior Auth Key: [id number]- response PA not required.   RN called pharmacy to verify pricing for 30 day vs 90 supply   30 day- about 1/3 of the price, but could not give exact price  90 day - 53  RN called patient to verify that he has been using the 30 day supply and is able to pay for it. He states that it is expensive for a few months but then it gets a little better. Patient is interested in attempting patient assistance. RN to mail him the patient portion and he will drop it back off once completed for [place] to mail in for him.    Routing to Dr. [name] and Isac Caddy as Juluis Rainier.

## 2022-01-01 NOTE — Telephone Encounter (Signed)
Pt c/o medication issue:  1. Name of Medication: sacubitril-valsartan (ENTRESTO) 97-103 MG  2. How are you currently taking this medication (dosage and times per day)? Take 1 tablet by mouth 2 (two) times daily  3. Are you having a reaction (difficulty breathing--STAT)? no  4. What is your medication issue? Patient calling in because medication is over $400. Seeing if it can be put in for 30 days and not 90

## 2022-01-01 NOTE — Telephone Encounter (Signed)
*  STAT* If patient is at the pharmacy, call can be transferred to refill team.   1. Which medications need to be refilled? (please list name of each medication and dose if known) ENTRESTO 97-103 MG  metoprolol succinate (TOPROL-XL) 100 MG 24 hr tablet  2. Which pharmacy/location (including street and city if local pharmacy) is medication to be sent to? [place] [place] - [address] AT [place]  3. Do they need a 30 day or 90 day supply? [place]

## 2022-02-06 ENCOUNTER — Ambulatory Visit: Payer: PPO | Admitting: Cardiology

## 2022-02-06 ENCOUNTER — Encounter: Payer: Self-pay | Admitting: Cardiology

## 2022-02-06 VITALS — BP 112/74 | HR 74 | Resp 96 | Ht 67.0 in | Wt 198.0 lb

## 2022-02-06 DIAGNOSIS — D6869 Other thrombophilia: Secondary | ICD-10-CM

## 2022-02-06 DIAGNOSIS — I4819 Other persistent atrial fibrillation: Secondary | ICD-10-CM | POA: Diagnosis not present

## 2022-02-06 DIAGNOSIS — I5042 Chronic combined systolic (congestive) and diastolic (congestive) heart failure: Secondary | ICD-10-CM | POA: Diagnosis not present

## 2022-02-06 NOTE — Progress Notes (Signed)
Electrophysiology Office Note   Date:  02/06/2022   ID:  Juan Montes, DOB 28-Oct-1950, MRN [id number]  PCP:  Crist Infante, MD  Cardiologist:  [name] Primary Electrophysiologist:  Fumie Fiallo Meredith Leeds, MD    Chief Complaint: AF   History of Present Illness: Juan Montes is a 71 y.o. male who is being seen today for the evaluation of AF at the request of Crist Infante, MD. Presenting today for electrophysiology evaluation.  He has a history significant for chronic systolic heart failure, hypertension, OSA, hyperlipidemia, persistent atrial fibrillation.  He is post atrial fibrillation ablation 08/10/2021.  Today, denies symptoms of palpitations, chest pain, shortness of breath, orthopnea, PND, lower extremity edema, claudication, dizziness, presyncope, syncope, bleeding, or neurologic sequela. The patient is tolerating medications without difficulties.  Since being seen he has done well.  He is noted no further episodes of atrial fibrillation.  He is overall quite happy with his control.  He has no chest pain or shortness of breath.  He is able to all of his daily activities.  He is planning on beginning to exercise more frequently.   Past Medical History:  Diagnosis Date   A-fib ([place]) 09/20/2013   Anxiety disorder 12/19/2009   Arthritis of knee, degenerative 12/19/2009   BP (high blood pressure) 12/19/2009   Chronic combined systolic and diastolic CHF (congestive heart failure) ([place])    a. MV/LHC 2019 EF 30-45%; b.Echo 2020 EF 30-35%, mod red LVSF, mod LAE, mild aortic root dialtion, 40 mm; c.TEE 9/22 EF 20-25%, red LVF, LAA thrombus, severe LAE, mild MR/AR, mild plaque des aorta; d. TEE 10/22 EF 30%, dec LVF, RVSF, sev LAE, mod RAE, no evidence of LAA thrombus, no aortic root dilation.   Clinical depression 12/19/2009   Coronary artery disease, non-occlusive    a. abnormal MV 2019, EF 30-43%; b. LHC 03/2018 Lat Ramus lesion is 40% stenosed. Otherwise minimal CAD, EF 45%.    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   Elevated fasting blood sugar 12/19/2009   GERD (gastroesophageal reflux disease)    HLD (hyperlipidemia) 12/19/2009   HTN (hypertension)    Sleep apnea    Past Surgical History:  Procedure Laterality Date   ATRIAL FIBRILLATION ABLATION N/A 08/10/2021   Procedure: ATRIAL FIBRILLATION ABLATION;  Surgeon: Constance Haw, MD;  Location: [facility];  Service: Cardiovascular;  Laterality: N/A;   HERNIA REPAIR     KNEE SURGERY Left    LEFT HEART CATH AND CORONARY ANGIOGRAPHY N/A 04/15/2018   Procedure: LEFT HEART CATH AND CORONARY ANGIOGRAPHY;  Surgeon: Leonie Man, MD;  Location: [facility];  Service: Cardiovascular;  Laterality: N/A;   TEE WITHOUT CARDIOVERSION N/A 04/24/2021   Procedure: TRANSESOPHAGEAL ECHOCARDIOGRAM (TEE);  Surgeon: Lelon Perla, MD;  Location: [facility];  Service: Cardiovascular;  Laterality: N/A;   TEE WITHOUT CARDIOVERSION N/A 06/04/2021   Procedure: TRANSESOPHAGEAL ECHOCARDIOGRAM (TEE);  Surgeon: Elouise Munroe, MD;  Location: [facility];  Service: Cardiology;  Laterality: N/A;   TONSILLECTOMY     TOTAL HIP ARTHROPLASTY Right 06/14/2020   Procedure: TOTAL HIP ARTHROPLASTY ANTERIOR APPROACH;  Surgeon: Gaynelle Arabian, MD;  Location: [facility];  Service: Orthopedics;  Laterality: Right;   WISDOM TOOTH EXTRACTION       Current Outpatient Medications  Medication Sig Dispense Refill   amLODipine (NORVASC) 10 MG tablet Take 1 tablet (10 mg total) by mouth daily. 90 tablet 3  buPROPion (WELLBUTRIN XL) 150 MG 24 hr tablet Take 150 mg by mouth daily.     ezetimibe (ZETIA) 10 MG tablet Take 10 mg by mouth daily.     metoprolol succinate (TOPROL-XL) 100 MG 24 hr tablet Take 1 tablet (100 mg total) by mouth daily. Take with or immediately following a meal. 90 tablet 2   sacubitril-valsartan (ENTRESTO) 97-103 MG Take 1 tablet by mouth 2  (two) times daily. 60 tablet 11   sertraline (ZOLOFT) 100 MG tablet Take 100 mg by mouth daily.      XARELTO 20 MG TABS tablet TAKE 1 TABLET(20 MG) BY MOUTH DAILY WITH SUPPER 30 tablet 6   No current facility-administered medications for this visit.    Allergies:   Apixaban, Atorvastatin, Celecoxib, and Pravastatin   Social History:  The patient  reports that he has quit smoking. His smoking use included cigars. He has never used smokeless tobacco. He reports that he does not drink alcohol and does not use drugs.   Family History:  [name] includes Heart attack (age of onset: 12) in his brother; Hypertension in his brother and father; Kidney Stones in his mother.   ROS:  Please see the history of present illness.   Otherwise, review of systems is positive for none.   All other systems are reviewed and negative.   PHYSICAL EXAM: VS:  BP 112/74   Pulse 74   Resp (!) 96   Ht '5\' 7"'$  (1.702 m)   Wt 198 lb (89.8 kg)   BMI 31.01 kg/m  , BMI Body mass index is 31.01 kg/m. GEN: Well nourished, well developed, in no acute distress  HEENT: normal  Neck: no JVD, carotid bruits, or masses Cardiac: RRR; no murmurs, rubs, or gallops,no edema  Respiratory:  clear to auscultation bilaterally, normal work of breathing GI: soft, nontender, nondistended, + BS MS: no deformity or atrophy  Skin: warm and dry Neuro:  Strength and sensation are intact Psych: euthymic mood, full affect  EKG:  EKG is ordered today. Personal review of the ekg ordered shows sinus rhythm, rate 74  Recent Labs: 08/11/2021: B Natriuretic Peptide 471.4; BUN 11; Creatinine, Ser 1.01; Hemoglobin 14.4; Platelets 143; Potassium 3.7; Sodium 134    Lipid Panel     Component Value Date/Time   CHOL 158 01/31/2021 0915   TRIG 99 01/31/2021 0915   HDL 55 01/31/2021 0915   CHOLHDL 2.9 01/31/2021 0915   LDLCALC 85 01/31/2021 0915     Wt Readings from Last 3 Encounters:  02/06/22 198 lb (89.8 kg)   11/06/21 196 lb 12.8 oz (89.3 kg)  10/12/21 200 lb 1.6 oz (90.8 kg)      Other studies Reviewed: Additional studies/ records that were reviewed today include: TTE 02/20/21  Review of the above records today demonstrates:   1. Left ventricular ejection fraction, by estimation, is 30 to 35%. Left  ventricular ejection fraction by PLAX is 33 %. The left ventricle has  moderately decreased function. The left ventricle demonstrates global  hypokinesis. The left ventricular  internal cavity size was mildly dilated. Left ventricular diastolic  function could not be evaluated.   2. Right ventricular systolic function is mildly reduced. The right  ventricular size is normal. There is normal pulmonary artery systolic  pressure. The estimated right ventricular systolic pressure is 77.8 mmHg.   3. Left atrial size was severely dilated.   4. The mitral valve is abnormal. Mild mitral valve regurgitation.   5. The  aortic valve is tricuspid. Aortic valve regurgitation is mild.   6. Aortic dilatation noted. There is mild dilatation of the aortic root,  measuring 39 mm.   7. The inferior vena cava is normal in size with greater than 50%  respiratory variability, suggesting right atrial pressure of 3 mmHg.   ASSESSMENT AND PLAN:  1.  Persistent atrial fibrillation: Currently on Xarelto 20 mg daily, Toprol-XL 100 mg daily.  Status post ablation 08/10/2021.  He remains in sinus rhythm.  He is overall happy with his control and is feeling well.  No changes.  2.  Obstructive sleep apnea: CPAP compliance encouraged  3.  Chronic systolic and diastolic heart failure: Ejection fraction 30 to 35%.  We Jaydeen Odor repeat echo now that he was in sinus rhythm.  Current medicines are reviewed at length with the patient today.   The patient does not have concerns regarding his medicines.  The following changes were made today: None  Labs/ tests ordered today include:  Orders Placed This Encounter  Procedures   EKG  12-Lead   ECHOCARDIOGRAM COMPLETE      Disposition:   FU 6 months  Signed, Enrrique Mierzwa Meredith Leeds, MD  02/06/2022 11:40 AM     [facility] [address] [place] [address] [phone] (office) [phone] (fax)

## 2022-02-06 NOTE — Patient Instructions (Signed)
Medication Instructions:  Your physician recommends that you continue on your current medications as directed. Please refer to the Current Medication list given to you today.  *If you need a refill on your cardiac medications before your next appointment, please call your pharmacy*   Lab Work: None ordered   Testing/Procedures: Your physician has requested that you have an echocardiogram. Echocardiography is a painless test that uses sound waves to create images of your heart. It provides your doctor with information about the size and shape of your heart and how well your heart's chambers and valves are working. This procedure takes approximately one hour. There are no restrictions for this procedure.   Follow-Up: At [facility], you and your health needs are our priority.  As part of our continuing mission to provide you with exceptional heart care, we have created designated Provider Care Teams.  These Care Teams include your primary Cardiologist (physician) and Advanced Practice Providers (APPs -  Physician Assistants and Nurse Practitioners) who all work together to provide you with the care you need, when you need it.  Your next appointment:   To be  determined  after echocardiogram  The format for your next appointment:   In Person  Provider:   Allegra Lai, MD    Thank you for choosing [facility]!!   Trinidad Curet, RN [phone]  Other Instructions   Important Information About Sugar

## 2022-03-07 ENCOUNTER — Ambulatory Visit (HOSPITAL_COMMUNITY): Payer: PPO | Attending: Internal Medicine

## 2022-03-07 DIAGNOSIS — I4819 Other persistent atrial fibrillation: Secondary | ICD-10-CM | POA: Diagnosis not present

## 2022-03-07 DIAGNOSIS — I5042 Chronic combined systolic (congestive) and diastolic (congestive) heart failure: Secondary | ICD-10-CM | POA: Insufficient documentation

## 2022-03-07 LAB — ECHOCARDIOGRAM COMPLETE
Area-P 1/2: 3.2 cm2
P 1/2 time: 1140 msec
S' Lateral: 4.7 cm

## 2022-03-21 DIAGNOSIS — H52203 Unspecified astigmatism, bilateral: Secondary | ICD-10-CM | POA: Diagnosis not present

## 2022-03-21 DIAGNOSIS — H5213 Myopia, bilateral: Secondary | ICD-10-CM | POA: Diagnosis not present

## 2022-03-21 DIAGNOSIS — H25813 Combined forms of age-related cataract, bilateral: Secondary | ICD-10-CM | POA: Diagnosis not present

## 2022-04-15 ENCOUNTER — Ambulatory Visit (HOSPITAL_BASED_OUTPATIENT_CLINIC_OR_DEPARTMENT_OTHER): Payer: PPO | Admitting: Cardiology

## 2022-04-15 ENCOUNTER — Encounter (HOSPITAL_BASED_OUTPATIENT_CLINIC_OR_DEPARTMENT_OTHER): Payer: Self-pay | Admitting: Cardiology

## 2022-04-15 VITALS — BP 118/76 | HR 82 | Ht 67.0 in | Wt 196.8 lb

## 2022-04-15 DIAGNOSIS — Z712 Person consulting for explanation of examination or test findings: Secondary | ICD-10-CM

## 2022-04-15 DIAGNOSIS — I428 Other cardiomyopathies: Secondary | ICD-10-CM

## 2022-04-15 DIAGNOSIS — Z7901 Long term (current) use of anticoagulants: Secondary | ICD-10-CM

## 2022-04-15 DIAGNOSIS — I2510 Atherosclerotic heart disease of native coronary artery without angina pectoris: Secondary | ICD-10-CM | POA: Diagnosis not present

## 2022-04-15 DIAGNOSIS — E7800 Pure hypercholesterolemia, unspecified: Secondary | ICD-10-CM | POA: Diagnosis not present

## 2022-04-15 DIAGNOSIS — G4733 Obstructive sleep apnea (adult) (pediatric): Secondary | ICD-10-CM | POA: Diagnosis not present

## 2022-04-15 DIAGNOSIS — D6869 Other thrombophilia: Secondary | ICD-10-CM

## 2022-04-15 DIAGNOSIS — I480 Paroxysmal atrial fibrillation: Secondary | ICD-10-CM | POA: Diagnosis not present

## 2022-04-15 NOTE — Patient Instructions (Signed)

## 2022-04-15 NOTE — Progress Notes (Signed)
Cardiology Office Note:    Date:  04/15/2022   ID:  Juan Montes, DOB September 07, 1950, MRN [id number]  PCP:  Crist Infante, MD  Cardiologist:  Buford Dresser, MD PhD  Referring MD: Crist Infante, MD   CC: follow up  History of Present Illness:    Juan Montes is a 71 y.o. male with a hx of nonischemic cardiomyopathy, chronic systolic and diastolic heart failure, HTN, HLD, atrial fibrillation, prior LV thrombus with resolution (2015),  who is seen for follow up of his cardiac conditions. He was last seen by Rosaria Ferries on 05/14/18 and previously by Dr. Johnsie Cancel in 2015.  At his last visit, he mentioned that he underwent atrial fibrillation ablation on 08/10/2021. The next day he presented to the ED with sudden onset chest pain that morning. Imaging showed mild CHF. Initial troponin elevated greater than 1500. Diagnosed with pericarditis.  Today: He has been doing well. He states he has not felt his atrial fibrillation since his procedure. He is tolerating DOAC without significant issues.  We reviewed the results of his echo, which showed an improvement in his EF to 45-50%.  He denies any palpitations, chest pain, shortness of breath, or peripheral edema. No lightheadedness, headaches, syncope, orthopnea, or PND.  Past Medical History:  Diagnosis Date   A-fib ([place]) 09/20/2013   Anxiety disorder 12/19/2009   Arthritis of knee, degenerative 12/19/2009   BP (high blood pressure) 12/19/2009   Chronic combined systolic and diastolic CHF (congestive heart failure) ([place])    a. MV/LHC 2019 EF 30-45%; b.Echo 2020 EF 30-35%, mod red LVSF, mod LAE, mild aortic root dialtion, 40 mm; c.TEE 9/22 EF 20-25%, red LVF, LAA thrombus, severe LAE, mild MR/AR, mild plaque des aorta; d. TEE 10/22 EF 30%, dec LVF, RVSF, sev LAE, mod RAE, no evidence of LAA thrombus, no aortic root dilation.   Clinical depression 12/19/2009   Coronary artery disease, non-occlusive    a. abnormal MV 2019, EF 30-43%; b. LHC  03/2018 Lat Ramus lesion is 40% stenosed. Otherwise minimal CAD, EF 45%.   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   Elevated fasting blood sugar 12/19/2009   GERD (gastroesophageal reflux disease)    HLD (hyperlipidemia) 12/19/2009   HTN (hypertension)    Sleep apnea     Past Surgical History:  Procedure Laterality Date   ATRIAL FIBRILLATION ABLATION N/A 08/10/2021   Procedure: ATRIAL FIBRILLATION ABLATION;  Surgeon: Constance Haw, MD;  Location: [place] [facility];  Service: Cardiovascular;  Laterality: N/A;   HERNIA REPAIR     KNEE SURGERY Left    LEFT HEART CATH AND CORONARY ANGIOGRAPHY N/A 04/15/2018   Procedure: LEFT HEART CATH AND CORONARY ANGIOGRAPHY;  Surgeon: Leonie Man, MD;  Location: [place] [facility];  Service: Cardiovascular;  Laterality: N/A;   TEE WITHOUT CARDIOVERSION N/A 04/24/2021   Procedure: TRANSESOPHAGEAL ECHOCARDIOGRAM (TEE);  Surgeon: Lelon Perla, MD;  Location: [facility];  Service: Cardiovascular;  Laterality: N/A;   TEE WITHOUT CARDIOVERSION N/A 06/04/2021   Procedure: TRANSESOPHAGEAL ECHOCARDIOGRAM (TEE);  Surgeon: Elouise Munroe, MD;  Location: [place];  Service: Cardiology;  Laterality: N/A;   TONSILLECTOMY     TOTAL HIP ARTHROPLASTY Right 06/14/2020   Procedure: TOTAL HIP ARTHROPLASTY ANTERIOR APPROACH;  Surgeon: Gaynelle Arabian, MD;  Location: [facility];  Service: Orthopedics;  Laterality: Right;   WISDOM TOOTH EXTRACTION      Current Medications: Current Outpatient Medications on File  Prior to Visit  Medication Sig   amLODipine (NORVASC) 10 MG tablet Take 1 tablet (10 mg total) by mouth daily.   buPROPion (WELLBUTRIN XL) 150 MG 24 hr tablet Take 150 mg by mouth daily.   ezetimibe (ZETIA) 10 MG tablet Take 10 mg by mouth daily.   metoprolol succinate (TOPROL-XL) 100 MG 24 hr tablet Take 1 tablet (100 mg total) by mouth daily. Take with or immediately following a  meal.   sacubitril-valsartan (ENTRESTO) 97-103 MG Take 1 tablet by mouth 2 (two) times daily.   sertraline (ZOLOFT) 100 MG tablet Take 100 mg by mouth daily.    XARELTO 20 MG TABS tablet TAKE 1 TABLET(20 MG) BY MOUTH DAILY WITH SUPPER   No current facility-administered medications on file prior to visit.     Allergies:   Apixaban, Atorvastatin, Celecoxib, and Pravastatin   Social History   Tobacco Use   Smoking status: Former    Types: Cigars   Smokeless tobacco: Never   Tobacco comments:    Quit about 3-4 months ago   Vaping Use   Vaping Use: Never used  Substance Use Topics   Alcohol use: No   Drug use: No      Family History: [name] includes Heart attack (age of onset: 106) in his brother; Hypertension in his brother and father; Kidney Stones in his mother. Father has severe CAD, med mgmt only option.  ROS:   Please see the history of present illness. All other systems are reviewed and negative.    EKGs/Labs/Other Studies Reviewed:    The following studies were reviewed today:  Echo 03/07/2022: 1. Left ventricular ejection fraction, by estimation, is 45 to 50%. Left  ventricular ejection fraction by 3D volume is 46 %. The left ventricle has  mildly decreased function. The left ventricle demonstrates global  hypokinesis. Left ventricular diastolic   parameters were normal.   2. Right ventricular systolic function is normal. The right ventricular  size is normal. There is normal pulmonary artery systolic pressure. The  estimated right ventricular systolic pressure is 17.7 mmHg.   3. Left atrial size was moderately dilated.   4. The mitral valve is grossly normal. Trivial mitral valve  regurgitation.   5. The aortic valve is tricuspid. Aortic valve regurgitation is trivial.  Aortic valve sclerosis is present, with no evidence of aortic valve  stenosis.   6. The inferior vena cava is normal in size with greater than 50%  respiratory  variability, suggesting right atrial pressure of 3 mmHg.   Comparison(s): Changes from prior study are noted. 08/11/2021: LVEF 30-35%,  global hypokinesis.    Echo 08/11/2021: 1. Left ventricular ejection fraction, by estimation, is 30 to 35%. The  left ventricle has moderately decreased function. The left ventricle  demonstrates global hypokinesis. There is mild concentric left ventricular  hypertrophy. Indeterminate diastolic  filling due to E-A fusion.   2. Right ventricular systolic function is normal. The right ventricular  size is normal. Tricuspid regurgitation signal is inadequate for assessing  PA pressure.   3. Left atrial size was severely dilated.   4. The pericardial effusion is surrounding the apex but trivial.   5. The mitral valve is abnormal. There is evidence of bileaflet mitral  valve prolapse and abnormal thickening. This study does not clearly show  mitral annular disjunction. No evidence of mitral valve regurgitation.   6. The aortic valve was not well visualized. Aortic valve regurgitation  is mild. No aortic stenosis is  present.   Comparison(s): No significant change from prior study.   Atrial Fibrillation Ablation 08/10/2021: CONCLUSIONS: 1. Atrial fibrillation upon presentation.   2. Successful electrical isolation and anatomical encircling of all four pulmonary veins with radiofrequency current.  A WACA approach was used 3. Additional left atrial ablation was performed with a standard box lesion created along the posterior Koors of the left atrium 4. Atrial fibrillation successfully cardioverted to sinus rhythm. 5. No early apparent complications.  Cardiac CTA 08/07/2021: FINDINGS: A 120 kV prospective scan was triggered in the descending thoracic aorta at 111 HU's. Gantry rotation speed was 280 msecs and collimation was .9 mm. No beta blockade and no NTG was given. The 3D data set was reconstructed in 5% intervals of the 60-80 % of the R-R cycle. Diastolic  phases were analyzed on a dedicated work station using MPR, MIP and VRT modes. The patient received 80 cc of contrast.   Left atrium: Severely dilated. Filling defect at tip of appendage on initial images, but appears to fill on delayed images (increase in HU from 40 to 80), suggesting slow filling but no thrombus in the left atrium or left atrial appendage.   Left ventricle: Normal in size.   Right atrium: Moderately dilated   Right ventricle: Mildly enlarged   Pericardium: Normal thickness   Pulmonary veins:   Left superior pulmonary vein: Measures 2.5cm x 1.6cm in diameter with area 3.3cm^2.   Left inferior pulmonary vein: Measures 2.0cm x 1.1cm in diameter with area 1.7cm^2.   Right superior pulmonary vein: Measures 2.2cm x 1.8cm in diameter with area 3.1cm^2.   Right inferior pulmonary vein: Measures 2.4cm x 2.1cm in diameter with area 3.8cm^2.   Coronary Arteries: Normal coronary origin. Right dominance. The study was performed without use of NTG and insufficient for plaque evaluation. Calcium score 286 (64th percentile)   Esophagus: Courses posterior to ostium of left sided pulmonary veins   IMPRESSION: 1. There is normal pulmonary vein drainage into the left atrium. Measurements as reported   2. There is a filling defect in left atrial appendage on initial images, but appears to fill on delayed images (increase in HU from 40 to 80 at appendage tip), suggesting slow filling but no thrombus in the left atrial appendage.   3. The esophagus courses posterior to ostium of left sided pulmonary veins   4.  Coronary calcium score 286 (64th percentile)  TEE 04/24/2021: 1. Severe global reduction in LV systolic function; spontaneous contrast  and probable thrombus in left atrial appendage and reduced emptying  velocity (20 cm/s). Results discussed with Dr Curt Bears.   2. Left ventricular ejection fraction, by estimation, is 20 to 25%. The  left ventricle has  severely decreased function. The left ventricle  demonstrates global hypokinesis. The left ventricular internal cavity size  was mildly dilated.   3. Right ventricular systolic function is normal. The right ventricular  size is mildly enlarged.   4. Left atrial size was severely dilated. A left atrial/left atrial  appendage thrombus was detected.   5. Right atrial size was mildly dilated.   6. The mitral valve is normal in structure. Mild mitral valve  regurgitation.   7. The aortic valve is tricuspid. Aortic valve regurgitation is mild.   8. There is mild (Grade II) plaque involving the descending aorta.   Cardiac Gated CTA 04/18/2021: FINDINGS: Calcium score: 227 involving the LAD and circumflex   LM: 0   LAD 151   LCX 75.3   RCA 0  Moderate LAE mild RAE. No ASD/PFO moderate amount of LAA thrombus seen on both immediate contrast and delayed images No pericardial effusion   Normal ascending thoracic aorta 3.6 cm Normal PV anatomy see description below   LUPV:  Ostium 12.2 mm    area 1.7 cm2   LLPV:   Ostium 21 mm   area 2.1 cm2 Elliptical   RUPV:  Ostium 17.8 mm  area 3.1 cm2   RLPV:  Ostium 20.7 mm  area 2.7 cm2   IMPRESSION: 1. Moderate amount of LAA thrombus seen on both immediate and delayed contrast images   2.  Normal PV anatomy see description above   3.  Calcium score 227 which is 60 th percentile for age/sex   4.  Normal ascending aorta 3.6 cm   5.  No pericardial effusion   6.  Moderate LAE mild RAE   7.  No ASD/PFO apparent   Consider f/u TEE after 4 week further anticoagulation after documenting compliance before proceeding with ablation  Echo 02/20/2021: 1. Left ventricular ejection fraction, by estimation, is 30 to 35%. Left  ventricular ejection fraction by PLAX is 33 %. The left ventricle has  moderately decreased function. The left ventricle demonstrates global  hypokinesis. The left ventricular  internal cavity size was mildly dilated.  Left ventricular diastolic  function could not be evaluated.   2. Right ventricular systolic function is mildly reduced. The right  ventricular size is normal. There is normal pulmonary artery systolic  pressure. The estimated right ventricular systolic pressure is 89.3 mmHg.   3. Left atrial size was severely dilated.   4. The mitral valve is abnormal. Mild mitral valve regurgitation.   5. The aortic valve is tricuspid. Aortic valve regurgitation is mild.   6. Aortic dilatation noted. There is mild dilatation of the aortic root,  measuring 39 mm.   7. The inferior vena cava is normal in size with greater than 50%  respiratory variability, suggesting right atrial pressure of 3 mmHg.   Comparison(s): Changes from prior study are noted. 03/04/2019: LVEF 30-35%,  global HK.   Echo 03/04/19  1. The left ventricle has moderate-severely reduced systolic function, with an ejection fraction of 30-35%. The cavity size was mildly dilated. Left ventricular diastolic Doppler parameters are consistent with pseudonormalization. Left ventricular  diffuse hypokinesis.  2. The right ventricle has normal systolic function. The cavity was normal.  3. Left atrial size was moderately dilated.  4. The mitral valve is abnormal. Mild thickening of the mitral valve leaflet.  5. The tricuspid valve is grossly normal.  6. The aortic valve is tricuspid. Mild thickening of the aortic valve. Aortic valve regurgitation is mild by color flow Doppler. No stenosis of the aortic valve.  7. The aorta is abnormal in size and structure.  8. There is mild dilatation of the aortic root measuring 40 mm.  9. Moderate to severe global reduction in LV systolic function; mild LVE; moderate diastolic dysfunction; moderate LAE; mildy dilated aortic root; mild AI; GLS-12.3%. Note cannot exclude apical thrombus; suggest FU limited study with definity to furter  Assess. (my comments: no thrombus seen, just not excluded. He is already on  anticoagulation, so definity study not pursued)    Echo 08/11/18 - Left ventricle: The cavity size was mildly dilated. There was   mild focal basal hypertrophy of the septum. Systolic function was   moderately to severely reduced. The estimated ejection fraction   was in the range of 30% to 35%.  Diffuse hypokinesis. Doppler   parameters are consistent with abnormal left ventricular   relaxation (grade 1 diastolic dysfunction). - Aortic valve: There was trivial regurgitation. - Aortic root: The aortic root was mildly dilated. - Ascending aorta: The ascending aorta was mildly dilated.   Impressions: - Moderate to severe global reduction in LV systolic function; mild   diastolic dysfunction; mild LVE; trace AI; mildly dilated aortic   root/ascending aorta.    ECHO: 03/16/2018 -The cavity size was mildly dilated. Systolic   function was mildly reduced. The estimated ejection fraction was   in the range of 45% to 50%. Diffuse hypokinesis. Doppler   parameters are consistent with abnormal left ventricular   relaxation (grade 1 diastolic dysfunction). - Aortic valve: There was trivial regurgitation. - Aorta: Aortic root dimension: 40 mm (ED). - Ascending aorta: The ascending aorta was mildly dilated. - Mitral valve: Mildly thickened leaflets . There was trivial   regurgitation. - Right ventricle: The cavity size was mildly dilated. [place]   thickness was normal.   CATH: 04/15/2018 Lat Ramus lesion is 40% stenosed. Otherwise minimal CAD. There is mild left ventricular systolic dysfunction. The left ventricular ejection fraction is 45-50% by visual estimate.   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  This could potentially be the reason for his symptoms, but not anginal equivalent.    EKG:  EKG is personally reviewed.  04/15/22: not ordered today. 10/12/2021: NSR at 67 bpm 05/08/2021: atrial fibrillation at 72 bpm 03/20/2021: Atrial fibrillation with a PVC vs. Aberrant  conduction. Rate 77 bpm. 03/30/2019: EKG was not ordered. 02/25/19: NSR, narrow QRS  Recent Labs: 08/11/2021: B Natriuretic Peptide 471.4; BUN 11; Creatinine, Ser 1.01; Hemoglobin 14.4; Platelets 143; Potassium 3.7; Sodium 134   Recent Lipid Panel    Component Value Date/Time   CHOL 158 01/31/2021 0915   TRIG 99 01/31/2021 0915   HDL 55 01/31/2021 0915   CHOLHDL 2.9 01/31/2021 0915   LDLCALC 85 01/31/2021 0915    Physical Exam:    VS:  BP 118/76   Pulse 82   Ht '5\' 7"'$  (1.702 m)   Wt 196 lb 12.8 oz (89.3 kg)   SpO2 98%   BMI 30.82 kg/m     Wt Readings from Last 3 Encounters:  04/15/22 196 lb 12.8 oz (89.3 kg)  02/06/22 198 lb (89.8 kg)  11/06/21 196 lb 12.8 oz (89.3 kg)    GEN: Well nourished, well developed in no acute distress HEENT: Normal, moist mucous membranes NECK: No JVD CARDIAC: regular rhythm, normal S1 and S2, no rubs or gallops. No murmur. VASCULAR: Radial and DP pulses 2+ bilaterally. No carotid bruits RESPIRATORY:  Clear to auscultation without rales, wheezing or rhonchi  ABDOMEN: Soft, non-tender, non-distended MUSCULOSKELETAL:  Ambulates independently SKIN: Warm and dry, no edema NEUROLOGIC:  Alert and oriented x 3. No focal neuro deficits noted. PSYCHIATRIC:  Normal affect    ASSESSMENT:    1. Paroxysmal atrial fibrillation (HCC)   2. Secondary hypercoagulable state ([place])   3. Long term current use of anticoagulant   4. NICM (nonischemic cardiomyopathy) ([place])   5. OSA (obstructive sleep apnea)   6. Pure hypercholesterolemia   7. Encounter to discuss test results   8. Nonocclusive coronary atherosclerosis of native coronary artery      PLAN:    Chronic combined systolic and diastolic heart failure nonischemic cardiomyopathy Improved EF -NYHA class I today. EF improved to 45-50% post ablation, reviewed today -continue metoprolol succinate 100 mg daily -continue entresto  max dose 97-103 BID -we have discussed SGLT2i in the past. Will hold on  this given improvement in EF -counseled on red flag warning signs that need immediate medical attention  Hypertension:  -continue metoprolol, entresto as above -continue amlodipine  History of atrial fibrillation, now s/p ablation LAA thrombus on TEE 04/24/21 -CHA2DS2/VAS Stroke Risk Points=4, continue rivaroxaban -holding sinus rhythm   OSA: using CPAP, encouraged this  Nonobstructive CAD Hypercholesterolemia, LDL goal <70 -last LDL 62, on ezetimibe. Declines statins, as he has had adverse events on atorvastatin and pravastatin  Prevention: -recommend heart healthy/Mediterranean diet, with whole grains, fruits, vegetable, fish, lean meats, nuts, and olive oil. Limit salt. -recommend moderate walking, 3-5 times/week for 30-50 minutes each session. Aim for at least 150 minutes.week. Goal should be pace of 3 miles/hours, or walking 1.5 miles in 30 minutes -recommend avoidance of tobacco products. Avoid excess alcohol.  Plan for follow up: 6 months or sooner as needed.  Buford Dresser, MD, PhD, [facility]    Medication Adjustments/Labs and Tests Ordered: Current medicines are reviewed at length with the patient today.  Concerns regarding medicines are outlined above.   No orders of the defined types were placed in this encounter.  No orders of the defined types were placed in this encounter.  Patient Instructions  Medication Instructions:  Your Physician recommend you continue on your current medication as directed.    *If you need a refill on your cardiac medications before your next appointment, please call your pharmacy*   Lab Work: None ordered today   Testing/Procedures: None ordered today   Follow-Up: At [facility], you and your health needs are our priority.  As part of our continuing mission to provide you with exceptional heart care, we have created designated Provider Care Teams.  These Care Teams include your primary  Cardiologist (physician) and Advanced Practice Providers (APPs -  Physician Assistants and Nurse Practitioners) who all work together to provide you with the care you need, when you need it.  We recommend signing up for the patient portal called "MyChart".  Sign up information is provided on this After Visit Summary.  MyChart is used to connect with patients for Virtual Visits (Telemedicine).  Patients are able to view lab/test results, encounter notes, upcoming appointments, etc.  Non-urgent messages can be sent to your provider as well.   To learn more about what you can do with MyChart, go to [email or web address].    Your next appointment:   6 month(s)  The format for your next appointment:   In Person  Provider:   Buford Dresser, MD              I,Breanna Adamick,acting as a scribe for Buford Dresser, MD.,have documented all relevant documentation on the behalf of Buford Dresser, MD,as directed by  Buford Dresser, MD while in the presence of Buford Dresser, MD.   I, Buford Dresser, MD, have reviewed all documentation for this visit. The documentation on 04/15/22 for the exam, diagnosis, procedures, and orders are all accurate and complete.   Signed, Buford Dresser, MD PhD 04/15/2022   [place]

## 2022-05-10 DIAGNOSIS — R051 Acute cough: Secondary | ICD-10-CM | POA: Diagnosis not present

## 2022-05-10 DIAGNOSIS — U071 COVID-19: Secondary | ICD-10-CM | POA: Diagnosis not present

## 2022-05-24 DIAGNOSIS — N1831 Chronic kidney disease, stage 3a: Secondary | ICD-10-CM | POA: Diagnosis not present

## 2022-05-24 DIAGNOSIS — R059 Cough, unspecified: Secondary | ICD-10-CM | POA: Diagnosis not present

## 2022-05-24 DIAGNOSIS — I5020 Unspecified systolic (congestive) heart failure: Secondary | ICD-10-CM | POA: Diagnosis not present

## 2022-05-24 DIAGNOSIS — J40 Bronchitis, not specified as acute or chronic: Secondary | ICD-10-CM | POA: Diagnosis not present

## 2022-05-24 DIAGNOSIS — I1310 Hypertensive heart and chronic kidney disease without heart failure, with stage 1 through stage 4 chronic kidney disease, or unspecified chronic kidney disease: Secondary | ICD-10-CM | POA: Diagnosis not present

## 2022-05-24 DIAGNOSIS — I4891 Unspecified atrial fibrillation: Secondary | ICD-10-CM | POA: Diagnosis not present

## 2022-05-24 DIAGNOSIS — U071 COVID-19: Secondary | ICD-10-CM | POA: Diagnosis not present

## 2022-06-28 DIAGNOSIS — Z1212 Encounter for screening for malignant neoplasm of rectum: Secondary | ICD-10-CM | POA: Diagnosis not present

## 2022-07-02 DIAGNOSIS — Z125 Encounter for screening for malignant neoplasm of prostate: Secondary | ICD-10-CM | POA: Diagnosis not present

## 2022-07-02 DIAGNOSIS — R7301 Impaired fasting glucose: Secondary | ICD-10-CM | POA: Diagnosis not present

## 2022-07-02 DIAGNOSIS — I10 Essential (primary) hypertension: Secondary | ICD-10-CM | POA: Diagnosis not present

## 2022-07-02 DIAGNOSIS — F419 Anxiety disorder, unspecified: Secondary | ICD-10-CM | POA: Diagnosis not present

## 2022-07-02 DIAGNOSIS — E785 Hyperlipidemia, unspecified: Secondary | ICD-10-CM | POA: Diagnosis not present

## 2022-07-05 DIAGNOSIS — I1310 Hypertensive heart and chronic kidney disease without heart failure, with stage 1 through stage 4 chronic kidney disease, or unspecified chronic kidney disease: Secondary | ICD-10-CM | POA: Diagnosis not present

## 2022-07-05 DIAGNOSIS — I4891 Unspecified atrial fibrillation: Secondary | ICD-10-CM | POA: Diagnosis not present

## 2022-07-05 DIAGNOSIS — I2510 Atherosclerotic heart disease of native coronary artery without angina pectoris: Secondary | ICD-10-CM | POA: Diagnosis not present

## 2022-07-05 DIAGNOSIS — D6869 Other thrombophilia: Secondary | ICD-10-CM | POA: Diagnosis not present

## 2022-07-05 DIAGNOSIS — G720 Drug-induced myopathy: Secondary | ICD-10-CM | POA: Diagnosis not present

## 2022-07-05 DIAGNOSIS — R82998 Other abnormal findings in urine: Secondary | ICD-10-CM | POA: Diagnosis not present

## 2022-07-05 DIAGNOSIS — E785 Hyperlipidemia, unspecified: Secondary | ICD-10-CM | POA: Diagnosis not present

## 2022-07-05 DIAGNOSIS — I428 Other cardiomyopathies: Secondary | ICD-10-CM | POA: Diagnosis not present

## 2022-07-05 DIAGNOSIS — I5020 Unspecified systolic (congestive) heart failure: Secondary | ICD-10-CM | POA: Diagnosis not present

## 2022-07-05 DIAGNOSIS — Z1339 Encounter for screening examination for other mental health and behavioral disorders: Secondary | ICD-10-CM | POA: Diagnosis not present

## 2022-07-05 DIAGNOSIS — N1831 Chronic kidney disease, stage 3a: Secondary | ICD-10-CM | POA: Diagnosis not present

## 2022-07-05 DIAGNOSIS — Z23 Encounter for immunization: Secondary | ICD-10-CM | POA: Diagnosis not present

## 2022-07-05 DIAGNOSIS — Z1331 Encounter for screening for depression: Secondary | ICD-10-CM | POA: Diagnosis not present

## 2022-07-05 DIAGNOSIS — Z0000 Encounter for general adult medical examination without abnormal findings: Secondary | ICD-10-CM | POA: Diagnosis not present

## 2022-07-12 ENCOUNTER — Telehealth (HOSPITAL_BASED_OUTPATIENT_CLINIC_OR_DEPARTMENT_OTHER): Payer: Self-pay | Admitting: Cardiology

## 2022-07-12 ENCOUNTER — Other Ambulatory Visit: Payer: Self-pay | Admitting: Cardiology

## 2022-07-12 DIAGNOSIS — R059 Cough, unspecified: Secondary | ICD-10-CM | POA: Diagnosis not present

## 2022-07-12 DIAGNOSIS — G43909 Migraine, unspecified, not intractable, without status migrainosus: Secondary | ICD-10-CM | POA: Diagnosis not present

## 2022-07-12 DIAGNOSIS — I480 Paroxysmal atrial fibrillation: Secondary | ICD-10-CM

## 2022-07-12 DIAGNOSIS — R0981 Nasal congestion: Secondary | ICD-10-CM | POA: Diagnosis not present

## 2022-07-12 DIAGNOSIS — R197 Diarrhea, unspecified: Secondary | ICD-10-CM | POA: Diagnosis not present

## 2022-07-12 DIAGNOSIS — J029 Acute pharyngitis, unspecified: Secondary | ICD-10-CM | POA: Diagnosis not present

## 2022-07-12 DIAGNOSIS — Z1152 Encounter for screening for COVID-19: Secondary | ICD-10-CM | POA: Diagnosis not present

## 2022-07-12 DIAGNOSIS — J019 Acute sinusitis, unspecified: Secondary | ICD-10-CM | POA: Diagnosis not present

## 2022-07-12 MED ORDER — RIVAROXABAN 20 MG PO TABS: 20.0000 mg | ORAL_TABLET | Freq: Every day | ORAL | 0 refills | Status: DC

## 2022-07-12 NOTE — Telephone Encounter (Signed)
Please review for refill. Thank you! 

## 2022-07-12 NOTE — Telephone Encounter (Signed)
Prescription refill request for Xarelto received.  Indication: Afib  Last office visit: 04/15/22 [name])  Weight: 89.3kg Age: 71 Scr: 1.01 (08/11/21)  CrCl: 85.100m/min  Appropriate dose and refill sent to requested pharmacy.

## 2022-07-12 NOTE — Telephone Encounter (Signed)
*  STAT* If patient is at the pharmacy, call can be transferred to refill team.   1. Which medications need to be refilled? (please list name of each medication and dose if known)   XARELTO 20 MG TABS tablet   2. Which pharmacy/location (including street and city if local pharmacy) is medication to be sent to?  [place], [place] - [address] AT [place]   3. Do they need a 30 day or 90 day supply?   90 day  Patient stated he has 2 days left of this medication.

## 2022-07-25 DIAGNOSIS — J111 Influenza due to unidentified influenza virus with other respiratory manifestations: Secondary | ICD-10-CM | POA: Diagnosis not present

## 2022-07-25 DIAGNOSIS — Z1152 Encounter for screening for COVID-19: Secondary | ICD-10-CM | POA: Diagnosis not present

## 2022-07-25 DIAGNOSIS — R52 Pain, unspecified: Secondary | ICD-10-CM | POA: Diagnosis not present

## 2022-07-25 DIAGNOSIS — J029 Acute pharyngitis, unspecified: Secondary | ICD-10-CM | POA: Diagnosis not present

## 2022-07-25 DIAGNOSIS — I4891 Unspecified atrial fibrillation: Secondary | ICD-10-CM | POA: Diagnosis not present

## 2022-07-25 DIAGNOSIS — I130 Hypertensive heart and chronic kidney disease with heart failure and stage 1 through stage 4 chronic kidney disease, or unspecified chronic kidney disease: Secondary | ICD-10-CM | POA: Diagnosis not present

## 2022-07-25 DIAGNOSIS — R051 Acute cough: Secondary | ICD-10-CM | POA: Diagnosis not present

## 2022-08-07 ENCOUNTER — Encounter: Payer: Self-pay | Admitting: Internal Medicine

## 2022-10-09 ENCOUNTER — Encounter: Payer: Self-pay | Admitting: Cardiology

## 2022-10-09 ENCOUNTER — Ambulatory Visit: Payer: PPO | Attending: Cardiology | Admitting: Cardiology

## 2022-10-09 VITALS — BP 118/76 | HR 69 | Ht 67.0 in | Wt 197.0 lb

## 2022-10-09 DIAGNOSIS — G4733 Obstructive sleep apnea (adult) (pediatric): Secondary | ICD-10-CM | POA: Diagnosis not present

## 2022-10-09 DIAGNOSIS — I5022 Chronic systolic (congestive) heart failure: Secondary | ICD-10-CM

## 2022-10-09 DIAGNOSIS — D6869 Other thrombophilia: Secondary | ICD-10-CM | POA: Diagnosis not present

## 2022-10-09 DIAGNOSIS — I4819 Other persistent atrial fibrillation: Secondary | ICD-10-CM | POA: Diagnosis not present

## 2022-10-09 NOTE — Progress Notes (Signed)
Electrophysiology Office Note   Date:  10/09/2022   ID:  Juan Montes, DOB 1951-02-15, MRN [id number]  PCP:  Crist Infante, MD  Cardiologist:  [name] Primary Electrophysiologist:  Willetta York Meredith Leeds, MD    Chief Complaint: AF   History of Present Illness: Juan Montes is a 72 y.o. male who is being seen today for the evaluation of AF at the request of Crist Infante, MD. Presenting today for electrophysiology evaluation.  He has a history significant for chronic systolic heart failure, hypertension, OSA, hyperlipidemia, persistent atrial fibrillation.  He is post atrial fibrillation ablation 08/10/2021.  His ejection fraction post ablation improved to 45 to 50%.  Today, denies symptoms of  chest pain, shortness of breath, orthopnea, PND, lower extremity edema, claudication, dizziness, presyncope, syncope, bleeding, or neurologic sequela. The patient is tolerating medications without difficulties.  Since his ablation he has done well.  He has had no chest pain or shortness of breath.  Is able to do all of his daily activities without restriction.  He has noted no further episodes of atrial fibrillation.    Past Medical History:  Diagnosis Date   A-fib ([place]) 09/20/2013   Anxiety disorder 12/19/2009   Arthritis of knee, degenerative 12/19/2009   BP (high blood pressure) 12/19/2009   Chronic combined systolic and diastolic CHF (congestive heart failure) ([place])    a. MV/LHC 2019 EF 30-45%; b.Echo 2020 EF 30-35%, mod red LVSF, mod LAE, mild aortic root dialtion, 40 mm; c.TEE 9/22 EF 20-25%, red LVF, LAA thrombus, severe LAE, mild MR/AR, mild plaque des aorta; d. TEE 10/22 EF 30%, dec LVF, RVSF, sev LAE, mod RAE, no evidence of LAA thrombus, no aortic root dilation.   Clinical depression 12/19/2009   Coronary artery disease, non-occlusive    a. abnormal MV 2019, EF 30-43%; b. LHC 03/2018 Lat Ramus lesion is 40% stenosed. Otherwise minimal CAD, EF 45%.   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   Elevated fasting blood sugar 12/19/2009   GERD (gastroesophageal reflux disease)    HLD (hyperlipidemia) 12/19/2009   HTN (hypertension)    Sleep apnea    Past Surgical History:  Procedure Laterality Date   ATRIAL FIBRILLATION ABLATION N/A 08/10/2021   Procedure: ATRIAL FIBRILLATION ABLATION;  Surgeon: Constance Haw, MD;  Location: [facility];  Service: Cardiovascular;  Laterality: N/A;   HERNIA REPAIR     KNEE SURGERY Left    LEFT HEART CATH AND CORONARY ANGIOGRAPHY N/A 04/15/2018   Procedure: LEFT HEART CATH AND CORONARY ANGIOGRAPHY;  Surgeon: Leonie Man, MD;  Location: [facility];  Service: Cardiovascular;  Laterality: N/A;   TEE WITHOUT CARDIOVERSION N/A 04/24/2021   Procedure: TRANSESOPHAGEAL ECHOCARDIOGRAM (TEE);  Surgeon: Lelon Perla, MD;  Location: [facility];  Service: Cardiovascular;  Laterality: N/A;   TEE WITHOUT CARDIOVERSION N/A 06/04/2021   Procedure: TRANSESOPHAGEAL ECHOCARDIOGRAM (TEE);  Surgeon: Elouise Munroe, MD;  Location: [facility];  Service: Cardiology;  Laterality: N/A;   TONSILLECTOMY     TOTAL HIP ARTHROPLASTY Right 06/14/2020   Procedure: TOTAL HIP ARTHROPLASTY ANTERIOR APPROACH;  Surgeon: Gaynelle Arabian, MD;  Location: [facility];  Service: Orthopedics;  Laterality: Right;   WISDOM TOOTH EXTRACTION       Current Outpatient Medications  Medication Sig Dispense Refill   amLODipine (NORVASC) 10 MG tablet Take 1 tablet (10 mg total) by mouth daily. 90 tablet 3   buPROPion (WELLBUTRIN XL) 150  MG 24 hr tablet Take 150 mg by mouth daily.     ezetimibe (ZETIA) 10 MG tablet Take 10 mg by mouth daily.     metoprolol succinate (TOPROL-XL) 100 MG 24 hr tablet Take 1 tablet (100 mg total) by mouth daily. Take with or immediately following a meal. 90 tablet 2   rivaroxaban (XARELTO) 20 MG TABS tablet Take 1 tablet (20 mg total) by mouth daily with supper. 90  tablet 0   sacubitril-valsartan (ENTRESTO) 97-103 MG Take 1 tablet by mouth 2 (two) times daily. 60 tablet 11   sertraline (ZOLOFT) 100 MG tablet Take 100 mg by mouth daily.      No current facility-administered medications for this visit.    Allergies:   Apixaban, Atorvastatin, Celecoxib, and Pravastatin   Social History:  The patient  reports that he has quit smoking. His smoking use included cigars. He has never used smokeless tobacco. He reports that he does not drink alcohol and does not use drugs.   Family History:  [name] includes Heart attack (age of onset: 32) in his brother; Hypertension in his brother and father; Kidney Stones in his mother.   ROS:  Please see the history of present illness.   Otherwise, review of systems is positive for none.   All other systems are reviewed and negative.   PHYSICAL EXAM: VS:  BP 118/76   Pulse 69   Ht '5\' 7"'$  (1.702 m)   Wt 197 lb (89.4 kg)   SpO2 98%   BMI 30.85 kg/m  , BMI Body mass index is 30.85 kg/m. GEN: Well nourished, well developed, in no acute distress  HEENT: normal  Neck: no JVD, carotid bruits, or masses Cardiac: RRR; no murmurs, rubs, or gallops,no edema  Respiratory:  clear to auscultation bilaterally, normal work of breathing GI: soft, nontender, nondistended, + BS MS: no deformity or atrophy  Skin: warm and dry Neuro:  Strength and sensation are intact Psych: euthymic mood, full affect  EKG:  EKG is ordered today. Personal review of the ekg ordered shows sinus rhythm, rate 69  Recent Labs: No results found for requested labs within last 365 days.    Lipid Panel     Component Value Date/Time   CHOL 158 01/31/2021 0915   TRIG 99 01/31/2021 0915   HDL 55 01/31/2021 0915   CHOLHDL 2.9 01/31/2021 0915   LDLCALC 85 01/31/2021 0915     Wt Readings from Last 3 Encounters:  10/09/22 197 lb (89.4 kg)  04/15/22 196 lb 12.8 oz (89.3 kg)  02/06/22 198 lb (89.8 kg)      Other studies  Reviewed: Additional studies/ records that were reviewed today include: TTE 02/20/21  Review of the above records today demonstrates:   1. Left ventricular ejection fraction, by estimation, is 30 to 35%. Left  ventricular ejection fraction by PLAX is 33 %. The left ventricle has  moderately decreased function. The left ventricle demonstrates global  hypokinesis. The left ventricular  internal cavity size was mildly dilated. Left ventricular diastolic  function could not be evaluated.   2. Right ventricular systolic function is mildly reduced. The right  ventricular size is normal. There is normal pulmonary artery systolic  pressure. The estimated right ventricular systolic pressure is [id number] mmHg.   3. Left atrial size was severely dilated.   4. The mitral valve is abnormal. Mild mitral valve regurgitation.   5. The aortic valve is tricuspid. Aortic valve regurgitation is mild.   6.  Aortic dilatation noted. There is mild dilatation of the aortic root,  measuring 39 mm.   7. The inferior vena cava is normal in size with greater than 50%  respiratory variability, suggesting right atrial pressure of 3 mmHg.   ASSESSMENT AND PLAN:  1.  Persistent atrial fibrillation: Currently on Xarelto 20 mg daily, Toprol-XL 100 mg daily.  CHA2DS2-VASc of 4.  Status post ablation 08/10/2021. Remains in sinus rhythm. No changes  2.  Obstructive sleep apnea: CPAP compliance encouraged  3.  Chronic systolic and diastolic heart failure: Ejection fraction improved to 45 to 50% post ablation with maintenance of sinus rhythm.  Plan per primary cardiology.  4.  Second hypercoagulable state: Currently on Xarelto for atrial fibrillation  Current medicines are reviewed at length with the patient today.   The patient does not have concerns regarding his medicines.  The following changes were made today: none  Labs/ tests ordered today include:  Orders Placed This Encounter  Procedures   EKG 12-Lead       Disposition:   FU 6 months  Signed, Kiyonna Tortorelli Meredith Leeds, MD  10/09/2022 3:36 PM     [address] [place] [address] [phone] (office) [phone] (fax)

## 2022-10-09 NOTE — Patient Instructions (Signed)
Medication Instructions:  °Your physician recommends that you continue on your current medications as directed. Please refer to the Current Medication list given to you today. ° °*If you need a refill on your cardiac medications before your next appointment, please call your pharmacy* ° ° °Lab Work: °None ordered ° ° °Testing/Procedures: °None ordered ° ° °Follow-Up: °At [facility], you and your health needs are our priority.  As part of our continuing mission to provide you with exceptional heart care, we have created designated Provider Care Teams.  These Care Teams include your primary Cardiologist (physician) and Advanced Practice Providers (APPs -  Physician Assistants and Nurse Practitioners) who all work together to provide you with the care you need, when you need it. ° °Your next appointment:   °6 month(s) ° °The format for your next appointment:   °In Person ° °Provider:   °Will Camnitz, MD ° ° ° °Thank you for choosing [facility]!! ° ° °Izella Ybanez, RN °[phone] °  °

## 2022-10-18 DIAGNOSIS — J019 Acute sinusitis, unspecified: Secondary | ICD-10-CM | POA: Diagnosis not present

## 2022-10-18 DIAGNOSIS — Z1152 Encounter for screening for COVID-19: Secondary | ICD-10-CM | POA: Diagnosis not present

## 2022-10-18 DIAGNOSIS — R5383 Other fatigue: Secondary | ICD-10-CM | POA: Diagnosis not present

## 2022-10-18 DIAGNOSIS — R52 Pain, unspecified: Secondary | ICD-10-CM | POA: Diagnosis not present

## 2022-10-18 DIAGNOSIS — I4891 Unspecified atrial fibrillation: Secondary | ICD-10-CM | POA: Diagnosis not present

## 2022-10-18 DIAGNOSIS — R0981 Nasal congestion: Secondary | ICD-10-CM | POA: Diagnosis not present

## 2022-10-18 DIAGNOSIS — I130 Hypertensive heart and chronic kidney disease with heart failure and stage 1 through stage 4 chronic kidney disease, or unspecified chronic kidney disease: Secondary | ICD-10-CM | POA: Diagnosis not present

## 2022-10-21 ENCOUNTER — Encounter (HOSPITAL_BASED_OUTPATIENT_CLINIC_OR_DEPARTMENT_OTHER): Payer: Self-pay | Admitting: Cardiology

## 2022-10-21 ENCOUNTER — Ambulatory Visit (HOSPITAL_BASED_OUTPATIENT_CLINIC_OR_DEPARTMENT_OTHER): Payer: PPO | Admitting: Cardiology

## 2022-10-21 VITALS — BP 125/81 | HR 75 | Ht 67.0 in | Wt 198.4 lb

## 2022-10-21 DIAGNOSIS — I5022 Chronic systolic (congestive) heart failure: Secondary | ICD-10-CM | POA: Diagnosis not present

## 2022-10-21 DIAGNOSIS — Z79899 Other long term (current) drug therapy: Secondary | ICD-10-CM | POA: Diagnosis not present

## 2022-10-21 DIAGNOSIS — D6869 Other thrombophilia: Secondary | ICD-10-CM

## 2022-10-21 DIAGNOSIS — E7800 Pure hypercholesterolemia, unspecified: Secondary | ICD-10-CM

## 2022-10-21 DIAGNOSIS — I480 Paroxysmal atrial fibrillation: Secondary | ICD-10-CM

## 2022-10-21 DIAGNOSIS — I428 Other cardiomyopathies: Secondary | ICD-10-CM

## 2022-10-21 DIAGNOSIS — Z7901 Long term (current) use of anticoagulants: Secondary | ICD-10-CM | POA: Diagnosis not present

## 2022-10-21 MED ORDER — ENTRESTO 97-103 MG PO TABS: 1.0000 | ORAL_TABLET | Freq: Two times a day (BID) | ORAL | 3 refills | Status: DC

## 2022-10-21 MED ORDER — METOPROLOL SUCCINATE ER 100 MG PO TB24: 100.0000 mg | ORAL_TABLET | Freq: Every day | ORAL | 3 refills | Status: DC

## 2022-10-21 NOTE — Progress Notes (Signed)
Cardiology Office Note:    Date:  10/21/2022   ID:  Juan Montes, DOB 05-Apr-1951, MRN [id number]  PCP:  Crist Infante, MD  Cardiologist:  Buford Dresser, MD PhD  Referring MD: Crist Infante, MD   CC: follow up  History of Present Illness:    Juan Montes is a 72 y.o. male with a hx of nonischemic cardiomyopathy, chronic systolic and diastolic heart failure, HTN, HLD, atrial fibrillation, prior LV thrombus with resolution (2015),  who is seen for follow up of his cardiac conditions. He was last seen by Rosaria Ferries on 05/14/18 and previously by Dr. Johnsie Cancel in 2015.  Today: Doing well overall. Is prone to getting URI/sinus infections for some reason, but tolerating well. Saw Dr. Curt Bears last week, no symptoms of afib. Weight stable, no swelling. Does not do routine physical activity, but stays busy at work. Plans to get into walking soon.   Denies chest pain, shortness of breath at rest or with normal exertion. No PND, orthopnea, LE edema or unexpected weight gain. No syncope or palpitations.   Past Medical History:  Diagnosis Date   A-fib ([place]) 09/20/2013   Anxiety disorder 12/19/2009   Arthritis of knee, degenerative 12/19/2009   BP (high blood pressure) 12/19/2009   Chronic combined systolic and diastolic CHF (congestive heart failure) ([place])    a. MV/LHC 2019 EF 30-45%; b.Echo 2020 EF 30-35%, mod red LVSF, mod LAE, mild aortic root dialtion, 40 mm; c.TEE 9/22 EF 20-25%, red LVF, LAA thrombus, severe LAE, mild MR/AR, mild plaque des aorta; d. TEE 10/22 EF 30%, dec LVF, RVSF, sev LAE, mod RAE, no evidence of LAA thrombus, no aortic root dilation.   Clinical depression 12/19/2009   Coronary artery disease, non-occlusive    a. abnormal MV 2019, EF 30-43%; b. LHC 03/2018 Lat Ramus lesion is 40% stenosed. Otherwise minimal CAD, EF 45%.   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   Elevated fasting blood sugar 12/19/2009    GERD (gastroesophageal reflux disease)    HLD (hyperlipidemia) 12/19/2009   HTN (hypertension)    Sleep apnea     Past Surgical History:  Procedure Laterality Date   ATRIAL FIBRILLATION ABLATION N/A 08/10/2021   Procedure: ATRIAL FIBRILLATION ABLATION;  Surgeon: Constance Haw, MD;  Location: [facility];  Service: Cardiovascular;  Laterality: N/A;   HERNIA REPAIR     KNEE SURGERY Left    LEFT HEART CATH AND CORONARY ANGIOGRAPHY N/A 04/15/2018   Procedure: LEFT HEART CATH AND CORONARY ANGIOGRAPHY;  Surgeon: Leonie Man, MD;  Location: [facility];  Service: Cardiovascular;  Laterality: N/A;   TEE WITHOUT CARDIOVERSION N/A 04/24/2021   Procedure: TRANSESOPHAGEAL ECHOCARDIOGRAM (TEE);  Surgeon: Lelon Perla, MD;  Location: [facility];  Service: Cardiovascular;  Laterality: N/A;   TEE WITHOUT CARDIOVERSION N/A 06/04/2021   Procedure: TRANSESOPHAGEAL ECHOCARDIOGRAM (TEE);  Surgeon: Elouise Munroe, MD;  Location: [facility];  Service: Cardiology;  Laterality: N/A;   TONSILLECTOMY     TOTAL HIP ARTHROPLASTY Right 06/14/2020   Procedure: TOTAL HIP ARTHROPLASTY ANTERIOR APPROACH;  Surgeon: Gaynelle Arabian, MD;  Location: [facility];  Service: Orthopedics;  Laterality: Right;   WISDOM TOOTH EXTRACTION      Current Medications: Current Outpatient Medications on File Prior to Visit  Medication Sig   amLODipine (NORVASC) 10 MG tablet Take 1 tablet (10 mg total) by mouth daily.   buPROPion (WELLBUTRIN XL) 150 MG 24 hr  tablet Take 150 mg by mouth daily.   cefdinir (OMNICEF) 300 MG capsule one capsule Orally twice a day with food for 10 days   ezetimibe (ZETIA) 10 MG tablet Take 10 mg by mouth daily.   rivaroxaban (XARELTO) 20 MG TABS tablet Take 1 tablet (20 mg total) by mouth daily with supper.   sertraline (ZOLOFT) 100 MG tablet Take 100 mg by mouth daily.    No current facility-administered medications on file prior to visit.     Allergies:   Apixaban,  Atorvastatin, Celecoxib, and Pravastatin   Social History   Tobacco Use   Smoking status: Former    Types: Cigars   Smokeless tobacco: Never   Tobacco comments:    Quit about 3-4 months ago   Vaping Use   Vaping Use: Never used  Substance Use Topics   Alcohol use: No   Drug use: No      Family History: [name] includes Heart attack (age of onset: 34) in his brother; Hypertension in his brother and father; Kidney Stones in his mother. Father has severe CAD, med mgmt only option.  ROS:   Please see the history of present illness. All other systems are reviewed and negative.    EKGs/Labs/Other Studies Reviewed:    The following studies were reviewed today:  Echo 03/07/2022: 1. Left ventricular ejection fraction, by estimation, is 45 to 50%. Left  ventricular ejection fraction by 3D volume is 46 %. The left ventricle has  mildly decreased function. The left ventricle demonstrates global  hypokinesis. Left ventricular diastolic   parameters were normal.   2. Right ventricular systolic function is normal. The right ventricular  size is normal. There is normal pulmonary artery systolic pressure. The  estimated right ventricular systolic pressure is 99991111 mmHg.   3. Left atrial size was moderately dilated.   4. The mitral valve is grossly normal. Trivial mitral valve  regurgitation.   5. The aortic valve is tricuspid. Aortic valve regurgitation is trivial.  Aortic valve sclerosis is present, with no evidence of aortic valve  stenosis.   6. The inferior vena cava is normal in size with greater than 50%  respiratory variability, suggesting right atrial pressure of 3 mmHg.   Comparison(s): Changes from prior study are noted. 08/11/2021: LVEF 30-35%,  global hypokinesis.    Echo 08/11/2021: 1. Left ventricular ejection fraction, by estimation, is 30 to 35%. The  left ventricle has moderately decreased function. The left ventricle  demonstrates global  hypokinesis. There is mild concentric left ventricular  hypertrophy. Indeterminate diastolic  filling due to E-A fusion.   2. Right ventricular systolic function is normal. The right ventricular  size is normal. Tricuspid regurgitation signal is inadequate for assessing  PA pressure.   3. Left atrial size was severely dilated.   4. The pericardial effusion is surrounding the apex but trivial.   5. The mitral valve is abnormal. There is evidence of bileaflet mitral  valve prolapse and abnormal thickening. This study does not clearly show  mitral annular disjunction. No evidence of mitral valve regurgitation.   6. The aortic valve was not well visualized. Aortic valve regurgitation  is mild. No aortic stenosis is present.   Comparison(s): No significant change from prior study.   Atrial Fibrillation Ablation 08/10/2021: CONCLUSIONS: 1. Atrial fibrillation upon presentation.   2. Successful electrical isolation and anatomical encircling of all four pulmonary veins with radiofrequency current.  A WACA approach was used 3. Additional left atrial ablation was  performed with a standard box lesion created along the posterior Pulver of the left atrium 4. Atrial fibrillation successfully cardioverted to sinus rhythm. 5. No early apparent complications.  Cardiac CTA 08/07/2021: IMPRESSION: 1. There is normal pulmonary vein drainage into the left atrium. Measurements as reported   2. There is a filling defect in left atrial appendage on initial images, but appears to fill on delayed images (increase in HU from 40 to 80 at appendage tip), suggesting slow filling but no thrombus in the left atrial appendage.   3. The esophagus courses posterior to ostium of left sided pulmonary veins   4.  Coronary calcium score 286 (64th percentile)  TEE 04/24/2021: 1. Severe global reduction in LV systolic function; spontaneous contrast  and probable thrombus in left atrial appendage and reduced emptying   velocity (20 cm/s). Results discussed with Dr Curt Bears.   2. Left ventricular ejection fraction, by estimation, is 20 to 25%. The  left ventricle has severely decreased function. The left ventricle  demonstrates global hypokinesis. The left ventricular internal cavity size  was mildly dilated.   3. Right ventricular systolic function is normal. The right ventricular  size is mildly enlarged.   4. Left atrial size was severely dilated. A left atrial/left atrial  appendage thrombus was detected.   5. Right atrial size was mildly dilated.   6. The mitral valve is normal in structure. Mild mitral valve  regurgitation.   7. The aortic valve is tricuspid. Aortic valve regurgitation is mild.   8. There is mild (Grade II) plaque involving the descending aorta.   Cardiac Gated CTA 04/18/2021: Moderate LAE mild RAE. No ASD/PFO moderate amount of LAA thrombus seen on both immediate contrast and delayed images No pericardial effusion   IMPRESSION: 1. Moderate amount of LAA thrombus seen on both immediate and delayed contrast images   2.  Normal PV anatomy see description above   3.  Calcium score 227 which is 60 th percentile for age/sex   4.  Normal ascending aorta 3.6 cm   5.  No pericardial effusion   6.  Moderate LAE mild RAE   7.  No ASD/PFO apparent   Consider f/u TEE after 4 week further anticoagulation after documenting compliance before proceeding with ablation  Echo 02/20/2021: 1. Left ventricular ejection fraction, by estimation, is 30 to 35%. Left  ventricular ejection fraction by PLAX is 33 %. The left ventricle has  moderately decreased function. The left ventricle demonstrates global  hypokinesis. The left ventricular  internal cavity size was mildly dilated. Left ventricular diastolic  function could not be evaluated.   2. Right ventricular systolic function is mildly reduced. The right  ventricular size is normal. There is normal pulmonary artery systolic   pressure. The estimated right ventricular systolic pressure is [id number] mmHg.   3. Left atrial size was severely dilated.   4. The mitral valve is abnormal. Mild mitral valve regurgitation.   5. The aortic valve is tricuspid. Aortic valve regurgitation is mild.   6. Aortic dilatation noted. There is mild dilatation of the aortic root,  measuring 39 mm.   7. The inferior vena cava is normal in size with greater than 50%  respiratory variability, suggesting right atrial pressure of 3 mmHg.   Comparison(s): Changes from prior study are noted. 03/04/2019: LVEF 30-35%,  global HK.   Echo 03/04/19  1. The left ventricle has moderate-severely reduced systolic function, with an ejection fraction of 30-35%. The cavity size was mildly dilated. Left  ventricular diastolic Doppler parameters are consistent with pseudonormalization. Left ventricular  diffuse hypokinesis.  2. The right ventricle has normal systolic function. The cavity was normal.  3. Left atrial size was moderately dilated.  4. The mitral valve is abnormal. Mild thickening of the mitral valve leaflet.  5. The tricuspid valve is grossly normal.  6. The aortic valve is tricuspid. Mild thickening of the aortic valve. Aortic valve regurgitation is mild by color flow Doppler. No stenosis of the aortic valve.  7. The aorta is abnormal in size and structure.  8. There is mild dilatation of the aortic root measuring 40 mm.  9. Moderate to severe global reduction in LV systolic function; mild LVE; moderate diastolic dysfunction; moderate LAE; mildy dilated aortic root; mild AI; GLS-12.3%. Note cannot exclude apical thrombus; suggest FU limited study with definity to furter  Assess. (my comments: no thrombus seen, just not excluded. He is already on anticoagulation, so definity study not pursued)    Echo 08/11/18 - Left ventricle: The cavity size was mildly dilated. There was   mild focal basal hypertrophy of the septum. Systolic function was    moderately to severely reduced. The estimated ejection fraction   was in the range of 30% to 35%. Diffuse hypokinesis. Doppler   parameters are consistent with abnormal left ventricular   relaxation (grade 1 diastolic dysfunction). - Aortic valve: There was trivial regurgitation. - Aortic root: The aortic root was mildly dilated. - Ascending aorta: The ascending aorta was mildly dilated.   Impressions: - Moderate to severe global reduction in LV systolic function; mild   diastolic dysfunction; mild LVE; trace AI; mildly dilated aortic   root/ascending aorta.    ECHO: 03/16/2018 -The cavity size was mildly dilated. Systolic   function was mildly reduced. The estimated ejection fraction was   in the range of 45% to 50%. Diffuse hypokinesis. Doppler   parameters are consistent with abnormal left ventricular   relaxation (grade 1 diastolic dysfunction). - Aortic valve: There was trivial regurgitation. - Aorta: Aortic root dimension: 40 mm (ED). - Ascending aorta: The ascending aorta was mildly dilated. - Mitral valve: Mildly thickened leaflets . There was trivial   regurgitation. - Right ventricle: The cavity size was mildly dilated. Noah   thickness was normal.   CATH: 04/15/2018 Lat Ramus lesion is 40% stenosed. Otherwise minimal CAD. There is mild left ventricular systolic dysfunction. The left ventricular ejection fraction is 45-50% by visual estimate.   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  This could potentially be the reason for his symptoms, but not anginal equivalent.    EKG:  EKG is personally reviewed.  10/21/22: not ordered today 04/15/22: not ordered today. 10/12/2021: NSR at 67 bpm 05/08/2021: atrial fibrillation at 72 bpm 03/20/2021: Atrial fibrillation with a PVC vs. Aberrant conduction. Rate 77 bpm. 03/30/2019: EKG was not ordered. 02/25/19: NSR, narrow QRS  Recent Labs: No results found for requested labs within last 365 days.   Recent Lipid  Panel    Component Value Date/Time   CHOL 158 01/31/2021 0915   TRIG 99 01/31/2021 0915   HDL 55 01/31/2021 0915   CHOLHDL 2.9 01/31/2021 0915   LDLCALC 85 01/31/2021 0915    Physical Exam:    VS:  BP 125/81 (BP Location: Left Arm, Patient Position: Sitting, Cuff Size: Large)   Pulse 75   Ht 5\' 7"  (1.702 m)   Wt 198 lb 6.4 oz (90 kg)   SpO2 97%   BMI 31.07 kg/m  Wt Readings from Last 3 Encounters:  10/21/22 198 lb 6.4 oz (90 kg)  10/09/22 197 lb (89.4 kg)  04/15/22 196 lb 12.8 oz (89.3 kg)    GEN: Well nourished, well developed in no acute distress HEENT: Normal, moist mucous membranes NECK: No JVD CARDIAC: regular rhythm, normal S1 and S2, no rubs or gallops. No murmur. VASCULAR: Radial and DP pulses 2+ bilaterally. No carotid bruits RESPIRATORY:  Clear to auscultation without rales, wheezing or rhonchi  ABDOMEN: Soft, non-tender, non-distended MUSCULOSKELETAL:  Ambulates independently SKIN: Warm and dry, no edema NEUROLOGIC:  Alert and oriented x 3. No focal neuro deficits noted. PSYCHIATRIC:  Normal affect    ASSESSMENT:    1. Paroxysmal atrial fibrillation (HCC)   2. Secondary hypercoagulable state ([place])   3. Long term current use of anticoagulant   4. NICM (nonischemic cardiomyopathy) ([place])   5. Chronic systolic (congestive) heart failure (H[facility])   6. Pure hypercholesterolemia   7. Medication management       PLAN:    Chronic combined systolic and diastolic heart failure nonischemic cardiomyopathy Improved EF -NYHA class I today. EF improved to 45-50% post ablation -continue metoprolol succinate 100 mg daily -continue entresto max dose 97-103 BID -we have discussed SGLT2i in the past.  -counseled on red flag warning signs that need immediate medical attention  Hypertension:  -continue metoprolol, entresto as above -continue amlodipine  History of atrial fibrillation, now s/p ablation LAA thrombus on TEE 04/24/21 -CHA2DS2/VAS Stroke Risk  Points=4, continue rivaroxaban -holding sinus rhythm   OSA: using CPAP, encouraged this  Nonobstructive CAD Hypercholesterolemia, LDL goal <70 -last LDL 89, on ezetimibe. Declines statins, as he has had adverse events on atorvastatin and pravastatin  Prevention: -recommend heart healthy/Mediterranean diet, with whole grains, fruits, vegetable, fish, lean meats, nuts, and olive oil. Limit salt. -recommend moderate walking, 3-5 times/week for 30-50 minutes each session. Aim for at least 150 minutes.week. Goal should be pace of 3 miles/hours, or walking 1.5 miles in 30 minutes -recommend avoidance of tobacco products. Avoid excess alcohol.  Plan for follow up: 6 months or sooner as needed.  Buford Dresser, MD, PhD, [facility]    Medication Adjustments/Labs and Tests Ordered: Current medicines are reviewed at length with the patient today.  Concerns regarding medicines are outlined above.   Orders Placed This Encounter  Procedures   CBC   Basic Metabolic Panel (BMET)   Meds ordered this encounter  Medications   sacubitril-valsartan (ENTRESTO) 97-103 MG    Sig: Take 1 tablet by mouth 2 (two) times daily.    Dispense:  180 tablet    Refill:  3   metoprolol succinate (TOPROL-XL) 100 MG 24 hr tablet    Sig: Take 1 tablet (100 mg total) by mouth daily. Take with or immediately following a meal.    Dispense:  90 tablet    Refill:  3   Patient Instructions  Medication Instructions:  Your physician recommends that you continue on your current medications as directed. Please refer to the Current Medication list given to you today.  *If you need a refill on your cardiac medications before your next appointment, please call your pharmacy*   Lab Work: Your physician recommends that you return for lab work today: BMET/CBC  If you have labs (blood work) drawn today and your tests are completely normal, you will receive your results only by: [place]  (if you have MyChart) OR A paper copy in the mail If you have  any lab test that is abnormal or we need to change your treatment, we will call you to review the results.  Follow-Up: At [facility], you and your health needs are our priority.  As part of our continuing mission to provide you with exceptional heart care, we have created designated Provider Care Teams.  These Care Teams include your primary Cardiologist (physician) and Advanced Practice Providers (APPs -  Physician Assistants and Nurse Practitioners) who all work together to provide you with the care you need, when you need it.  We recommend signing up for the patient portal called "MyChart".  Sign up information is provided on this After Visit Summary.  MyChart is used to connect with patients for Virtual Visits (Telemedicine).  Patients are able to view lab/test results, encounter notes, upcoming appointments, etc.  Non-urgent messages can be sent to your provider as well.   To learn more about what you can do with MyChart, go to [email or web address].    Your next appointment:   6 month(s)  Provider:   Buford Dresser, MD      Signed, Buford Dresser, MD PhD 10/21/2022   [place]

## 2022-10-21 NOTE — Patient Instructions (Signed)
Medication Instructions:  Your physician recommends that you continue on your current medications as directed. Please refer to the Current Medication list given to you today.  *If you need a refill on your cardiac medications before your next appointment, please call your pharmacy*   Lab Work: Your physician recommends that you return for lab work today: BMET/CBC  If you have labs (blood work) drawn today and your tests are completely normal, you will receive your results only by: MyChart Message (if you have MyChart) OR A paper copy in the mail If you have any lab test that is abnormal or we need to change your treatment, we will call you to review the results.  Follow-Up: At [facility], you and your health needs are our priority.  As part of our continuing mission to provide you with exceptional heart care, we have created designated Provider Care Teams.  These Care Teams include your primary Cardiologist (physician) and Advanced Practice Providers (APPs -  Physician Assistants and Nurse Practitioners) who all work together to provide you with the care you need, when you need it.  We recommend signing up for the patient portal called "MyChart".  Sign up information is provided on this After Visit Summary.  MyChart is used to connect with patients for Virtual Visits (Telemedicine).  Patients are able to view lab/test results, encounter notes, upcoming appointments, etc.  Non-urgent messages can be sent to your provider as well.   To learn more about what you can do with MyChart, go to [email or web address].    Your next appointment:   6 month(s)  Provider:   Buford Dresser, MD

## 2022-10-22 LAB — BASIC METABOLIC PANEL
BUN/Creatinine Ratio: 13 (ref 10–24)
BUN: 15 mg/dL (ref 8–27)
CO2: 24 mmol/L (ref 20–29)
Calcium: 9.2 mg/dL (ref 8.6–10.2)
Chloride: 101 mmol/L (ref 96–106)
Creatinine, Ser: 1.12 mg/dL (ref 0.76–1.27)
Glucose: 92 mg/dL (ref 70–99)
Potassium: 4.2 mmol/L (ref 3.5–5.2)
Sodium: 140 mmol/L (ref 134–144)
eGFR: 70 mL/min/{1.73_m2} (ref >59)

## 2022-10-22 LAB — CBC
Hematocrit: 46.7 % (ref 37.5–51.0)
Hemoglobin: 16 g/dL (ref 13.0–17.7)
MCH: 32.1 pg (ref 26.6–33.0)
MCHC: 34.3 g/dL (ref 31.5–35.7)
MCV: 94 fL (ref 79–97)
Platelets: 224 10*3/uL (ref 150–450)
RBC: 4.98 x10E6/uL (ref 4.14–5.80)
RDW: 12.5 % (ref 11.6–15.4)
WBC: 6.3 10*3/uL (ref 3.4–10.8)

## 2022-11-18 ENCOUNTER — Telehealth: Payer: Self-pay | Admitting: Cardiology

## 2022-11-18 DIAGNOSIS — I10 Essential (primary) hypertension: Secondary | ICD-10-CM

## 2022-11-18 MED ORDER — AMLODIPINE BESYLATE 10 MG PO TABS: 10.0000 mg | ORAL_TABLET | Freq: Every day | ORAL | 3 refills | Status: DC

## 2022-11-18 NOTE — Telephone Encounter (Signed)
Pt c/o medication issue:  1. Name of Medication: amLODipine (NORVASC) 10 MG tablet   2. How are you currently taking this medication (dosage and times per day)?   Take 1 tablet (10 mg total) by mouth daily.    3. Are you having a reaction (difficulty breathing--STAT)? No  4. What is your medication issue? Patient is needing a refill on this medication. Patient stated that his pharmacy said the refill was denied. Patient has only two tablets left and will be going out of town on Thursday. Patient is wanting to know why his refill was denied and how he can get his medication refilled. Please advise.

## 2022-11-28 ENCOUNTER — Telehealth: Payer: Self-pay | Admitting: Cardiology

## 2022-11-28 NOTE — Telephone Encounter (Signed)
Returned call to patient,   Patient states that he has just noticed his calves are swollen and they are a little hard. Once he is off his feet it goes down. No pain or discoloration at the site. He notices it worse in the evening. Advised patient that we tend to see this morning as the weather warms and not  uncommon. Advised him to sit with legs elevated when able, stay well hydrated and can try compression stockings.

## 2022-11-28 NOTE — Telephone Encounter (Signed)
Pt c/o swelling: STAT is pt has developed SOB within 24 hours  How much weight have you gained and in what time span? Unsure   If swelling, where is the swelling located? Swelling in calves   Are you currently taking a fluid pill? No   Are you currently SOB? No   Do you have a log of your daily weights (if so, list)?   Have you gained 3 pounds in a day or 5 pounds in a week? 3 lbs in a week (but thinks this is due to food)   Have you traveled recently? Yes

## 2023-01-10 ENCOUNTER — Other Ambulatory Visit (HOSPITAL_BASED_OUTPATIENT_CLINIC_OR_DEPARTMENT_OTHER): Payer: Self-pay | Admitting: Cardiology

## 2023-01-10 DIAGNOSIS — I480 Paroxysmal atrial fibrillation: Secondary | ICD-10-CM

## 2023-01-10 NOTE — Telephone Encounter (Signed)
*  STAT* If patient is at the pharmacy, call can be transferred to refill team.   1. Which medications need to be refilled? (please list name of each medication and dose if known) new prescription for Xarelto  2. Which pharmacy/location (including street and city if local pharmacy) is medication to be sent to? [facility] [address], [place]  3. Do they need a 30 day or 90 day supply? 90 days and refills- please call in today, will be out tomorrow

## 2023-01-13 ENCOUNTER — Telehealth: Payer: Self-pay | Admitting: Cardiology

## 2023-01-13 NOTE — Telephone Encounter (Signed)
*  STAT* If patient is at the pharmacy, call can be transferred to refill team.   1. Which medications need to be refilled? (please list name of each medication and dose if known)   rivaroxaban (XARELTO) 20 MG TABS tablet    2. Which pharmacy/location (including street and city if local pharmacy) is medication to be sent to? [id number] - [place], [place] - [address]    3. Do they need a 30 day or 90 day supply? 90 day

## 2023-01-13 NOTE — Telephone Encounter (Signed)
Patient is following up as he is completely out of medication. Please assist.

## 2023-01-14 ENCOUNTER — Other Ambulatory Visit: Payer: Self-pay

## 2023-01-14 DIAGNOSIS — I480 Paroxysmal atrial fibrillation: Secondary | ICD-10-CM

## 2023-01-14 MED ORDER — RIVAROXABAN 20 MG PO TABS: ORAL_TABLET | ORAL | 1 refills | Status: DC

## 2023-01-14 NOTE — Telephone Encounter (Signed)
Please review for refill. Thank you! 

## 2023-01-14 NOTE — Telephone Encounter (Signed)
Prescription refill request for Xarelto received.  Indication:AFIB Last office visit:3/24 Weight:90  kg Age:72 Scr:1.12  3/24 CrCl:77.01  ml/min  Prescription refilled

## 2023-01-14 NOTE — Telephone Encounter (Signed)
Pt last saw Dr Cristal Deer 10/21/22, last labs 10/21/22 Creat 1.12, age 72, weight 90kg, CrCl 77.01, based on CrCl pt is on appropriate dosage of Xarelto 20mg  every day for afib.  Will refill rx.

## 2023-02-04 DIAGNOSIS — F329 Major depressive disorder, single episode, unspecified: Secondary | ICD-10-CM | POA: Diagnosis not present

## 2023-02-04 DIAGNOSIS — M179 Osteoarthritis of knee, unspecified: Secondary | ICD-10-CM | POA: Diagnosis not present

## 2023-02-04 DIAGNOSIS — I5020 Unspecified systolic (congestive) heart failure: Secondary | ICD-10-CM | POA: Diagnosis not present

## 2023-02-04 DIAGNOSIS — I130 Hypertensive heart and chronic kidney disease with heart failure and stage 1 through stage 4 chronic kidney disease, or unspecified chronic kidney disease: Secondary | ICD-10-CM | POA: Diagnosis not present

## 2023-02-04 DIAGNOSIS — R7301 Impaired fasting glucose: Secondary | ICD-10-CM | POA: Diagnosis not present

## 2023-02-04 DIAGNOSIS — E785 Hyperlipidemia, unspecified: Secondary | ICD-10-CM | POA: Diagnosis not present

## 2023-02-04 DIAGNOSIS — I2510 Atherosclerotic heart disease of native coronary artery without angina pectoris: Secondary | ICD-10-CM | POA: Diagnosis not present

## 2023-02-04 DIAGNOSIS — N1831 Chronic kidney disease, stage 3a: Secondary | ICD-10-CM | POA: Diagnosis not present

## 2023-02-04 DIAGNOSIS — I4891 Unspecified atrial fibrillation: Secondary | ICD-10-CM | POA: Diagnosis not present

## 2023-02-04 DIAGNOSIS — F419 Anxiety disorder, unspecified: Secondary | ICD-10-CM | POA: Diagnosis not present

## 2023-02-20 ENCOUNTER — Telehealth (HOSPITAL_BASED_OUTPATIENT_CLINIC_OR_DEPARTMENT_OTHER): Payer: Self-pay | Admitting: Cardiology

## 2023-02-20 ENCOUNTER — Encounter (HOSPITAL_BASED_OUTPATIENT_CLINIC_OR_DEPARTMENT_OTHER): Payer: Self-pay

## 2023-02-20 NOTE — Telephone Encounter (Signed)
Called to speak with patient regarding the scheduling changes for Dr. Scurry Callas appt Friday 04/25/23 at 3:20 pm New appt Thursday 04/24/23 at 3:20 pm----will mail information to patient and will send My Chart message

## 2023-02-26 DIAGNOSIS — Z1152 Encounter for screening for COVID-19: Secondary | ICD-10-CM | POA: Diagnosis not present

## 2023-02-26 DIAGNOSIS — R058 Other specified cough: Secondary | ICD-10-CM | POA: Diagnosis not present

## 2023-02-26 DIAGNOSIS — I4891 Unspecified atrial fibrillation: Secondary | ICD-10-CM | POA: Diagnosis not present

## 2023-02-26 DIAGNOSIS — R0981 Nasal congestion: Secondary | ICD-10-CM | POA: Diagnosis not present

## 2023-02-26 DIAGNOSIS — R5383 Other fatigue: Secondary | ICD-10-CM | POA: Diagnosis not present

## 2023-03-14 ENCOUNTER — Telehealth: Payer: Self-pay | Admitting: Cardiology

## 2023-03-14 DIAGNOSIS — I480 Paroxysmal atrial fibrillation: Secondary | ICD-10-CM

## 2023-03-14 MED ORDER — RIVAROXABAN 20 MG PO TABS: ORAL_TABLET | ORAL | 1 refills | Status: DC

## 2023-03-14 NOTE — Telephone Encounter (Signed)
Rx(s) sent to pharmacy electronically.  

## 2023-03-14 NOTE — Telephone Encounter (Signed)
*  STAT* If patient is at the pharmacy, call can be transferred to refill team.   1. Which medications need to be refilled? (please list name of each medication and dose if known) rivaroxaban (XARELTO) 20 MG TABS tablet  2. Which pharmacy/location (including street and city if local pharmacy) is medication to be sent to?  [id number] - [place], [place] - [address]  3. Do they need a 30 day or 90 day supply?  90 day supply

## 2023-03-24 DIAGNOSIS — H25813 Combined forms of age-related cataract, bilateral: Secondary | ICD-10-CM | POA: Diagnosis not present

## 2023-03-24 DIAGNOSIS — H5213 Myopia, bilateral: Secondary | ICD-10-CM | POA: Diagnosis not present

## 2023-03-24 DIAGNOSIS — H353131 Nonexudative age-related macular degeneration, bilateral, early dry stage: Secondary | ICD-10-CM | POA: Diagnosis not present

## 2023-03-24 DIAGNOSIS — H52203 Unspecified astigmatism, bilateral: Secondary | ICD-10-CM | POA: Diagnosis not present

## 2023-04-15 ENCOUNTER — Ambulatory Visit: Payer: PPO | Admitting: Cardiology

## 2023-04-24 ENCOUNTER — Encounter (HOSPITAL_BASED_OUTPATIENT_CLINIC_OR_DEPARTMENT_OTHER): Payer: Self-pay | Admitting: Cardiology

## 2023-04-24 ENCOUNTER — Ambulatory Visit (HOSPITAL_BASED_OUTPATIENT_CLINIC_OR_DEPARTMENT_OTHER): Payer: PPO | Admitting: Cardiology

## 2023-04-24 VITALS — BP 118/80 | HR 73 | Ht 67.0 in | Wt 199.2 lb

## 2023-04-24 DIAGNOSIS — G4733 Obstructive sleep apnea (adult) (pediatric): Secondary | ICD-10-CM | POA: Diagnosis not present

## 2023-04-24 DIAGNOSIS — I5022 Chronic systolic (congestive) heart failure: Secondary | ICD-10-CM | POA: Diagnosis not present

## 2023-04-24 DIAGNOSIS — I10 Essential (primary) hypertension: Secondary | ICD-10-CM | POA: Diagnosis not present

## 2023-04-24 DIAGNOSIS — E7800 Pure hypercholesterolemia, unspecified: Secondary | ICD-10-CM | POA: Diagnosis not present

## 2023-04-24 DIAGNOSIS — I480 Paroxysmal atrial fibrillation: Secondary | ICD-10-CM | POA: Diagnosis not present

## 2023-04-24 DIAGNOSIS — Z7901 Long term (current) use of anticoagulants: Secondary | ICD-10-CM | POA: Diagnosis not present

## 2023-04-24 DIAGNOSIS — I428 Other cardiomyopathies: Secondary | ICD-10-CM | POA: Diagnosis not present

## 2023-04-24 DIAGNOSIS — D6869 Other thrombophilia: Secondary | ICD-10-CM | POA: Diagnosis not present

## 2023-04-24 DIAGNOSIS — I2510 Atherosclerotic heart disease of native coronary artery without angina pectoris: Secondary | ICD-10-CM | POA: Diagnosis not present

## 2023-04-24 NOTE — Patient Instructions (Signed)
Medication Instructions:  The current medical regimen is effective;  continue present plan and medications as directed. Please refer to the Current Medication list given to you today.  *If you need a refill on your cardiac medications before your next appointment, please call your pharmacy*   Lab Work: NON If you have labs (blood work) drawn today and your tests are completely normal, you will receive your results only by: MyChart Message (if you have MyChart) OR A paper copy in the mail If you have any lab test that is abnormal or we need to change your treatment, we will call you to review the results.   Testing/Procedures: NONE   Follow-Up: At [facility], you and your health needs are our priority.  As part of our continuing mission to provide you with exceptional heart care, we have created designated Provider Care Teams.  These Care Teams include your primary Cardiologist (physician) and Advanced Practice Providers (APPs -  Physician Assistants and Nurse Practitioners) who all work together to provide you with the care you need, when you need it.  We recommend signing up for the patient portal called "MyChart".  Sign up information is provided on this After Visit Summary.  MyChart is used to connect with patients for Virtual Visits (Telemedicine).  Patients are able to view lab/test results, encounter notes, upcoming appointments, etc.  Non-urgent messages can be sent to your provider as well.   To learn more about what you can do with MyChart, go to [email or web address].    Your next appointment:   6 month(s)  Provider:   Jodelle Red, MD

## 2023-04-24 NOTE — Progress Notes (Signed)
Cardiology Office Note:  .   Date:  04/24/2023  ID:  Roland Rack, DOB 14-Dec-1950, MRN [id number] PCP: Rodrigo Ran, MD   HeartCare Providers Cardiologist:  Jodelle Red, MD Electrophysiologist:  Will Jorja Loa, MD {  History of Present Illness: .   Juan Montes is a 72 y.o. 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 He was last seen by Theodore Demark on 05/14/18 and previously by Dr. Eden Emms in 2015.   Today: Doing well overall. Has some fatigue in the afternoon, mild. Staying active. 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 Prior echo in 2023 showed EF improved to 45-50% with being in sinus rhythm. He has not felt any afib recently. Asking if he needs to continue Xarelto, as he is in the donut hole. We discussed today.  ROS: Denies chest pain, shortness of breath at rest or with normal exertion. No PND, orthopnea, LE edema or unexpected weight gain. No syncope or palpitations. ROS otherwise negative except as noted.   Studies Reviewed: Marland Kitchen    EKG:  EKG Interpretation Date/Time:  Thursday April 24 2023 15:05:32 EDT Ventricular Rate:  68 PR Interval:  178 QRS Duration:  106 QT Interval:  412 QTC Calculation: 438 R Axis:   -9  Text Interpretation: Normal sinus rhythm Normal ECG Confirmed by Jodelle Red [phone]) on 04/24/2023 3:43:27 PM    Physical Exam:   VS:  BP 118/80   Pulse 73   Ht 5\' 7"  (1.702 m)   Wt 199 lb 3.2 oz (90.4 kg)   SpO2 98%   BMI 31.20 kg/m    Wt Readings from Last 3 Encounters:  04/24/23 199 lb 3.2 oz (90.4 kg)  10/21/22 198 lb 6.4 oz (90 kg)  10/09/22 197 lb (89.4 kg)    GEN: Well nourished, well developed in no acute distress HEENT: Normal, moist mucous membranes NECK: No JVD CARDIAC: regular rhythm, normal S1 and S2, no rubs or  gallops. No murmur. VASCULAR: Radial and DP pulses 2+ bilaterally. No carotid bruits RESPIRATORY:  Clear to auscultation without rales, wheezing or rhonchi  ABDOMEN: Soft, non-tender, non-distended MUSCULOSKELETAL:  Ambulates independently SKIN: Warm and dry, no edema NEUROLOGIC:  Alert and oriented x 3. No focal neuro deficits noted. PSYCHIATRIC:  Normal affect    ASSESSMENT AND PLAN: .    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 EF improved to 45-50% post ablation -continue metoprolol succinate 100 mg daily -continue entresto max dose 97-103 BID -we have discussed SGLT2i in the past.  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 Declines statins, as he has had adverse events on atorvastatin and pravastatin   CV risk counseling and prevention -recommend heart healthy/Mediterranean diet, with whole grains, fruits, vegetable, fish, lean meats, nuts, and olive oil. Limit salt. -recommend moderate walking, 3-5 times/week for 30-50 minutes each session. Aim for at least 150 minutes.week. Goal should be pace of 3 miles/hours, or walking 1.5 miles  in 30 minutes -recommend avoidance of tobacco products. Avoid excess alcohol.  Dispo: 6 months  Signed, Jodelle Red, MD   Jodelle Red, MD, PhD, [facility]  [facility]  [place]  Heart & Vascular at [facility] at [facility] [address] [place], [place] [address] [phone]

## 2023-04-25 ENCOUNTER — Ambulatory Visit (HOSPITAL_BASED_OUTPATIENT_CLINIC_OR_DEPARTMENT_OTHER): Payer: PPO | Admitting: Cardiology

## 2023-05-15 ENCOUNTER — Ambulatory Visit: Payer: PPO | Admitting: Cardiology

## 2023-05-17 DIAGNOSIS — Z23 Encounter for immunization: Secondary | ICD-10-CM | POA: Diagnosis not present

## 2023-08-07 ENCOUNTER — Other Ambulatory Visit (HOSPITAL_BASED_OUTPATIENT_CLINIC_OR_DEPARTMENT_OTHER): Payer: Self-pay | Admitting: Cardiology

## 2023-08-07 DIAGNOSIS — I428 Other cardiomyopathies: Secondary | ICD-10-CM

## 2023-08-07 DIAGNOSIS — I5022 Chronic systolic (congestive) heart failure: Secondary | ICD-10-CM

## 2023-10-07 ENCOUNTER — Other Ambulatory Visit (HOSPITAL_BASED_OUTPATIENT_CLINIC_OR_DEPARTMENT_OTHER): Payer: Self-pay | Admitting: Cardiology

## 2023-10-07 DIAGNOSIS — I5022 Chronic systolic (congestive) heart failure: Secondary | ICD-10-CM

## 2023-10-07 DIAGNOSIS — I428 Other cardiomyopathies: Secondary | ICD-10-CM

## 2023-10-23 ENCOUNTER — Ambulatory Visit (HOSPITAL_BASED_OUTPATIENT_CLINIC_OR_DEPARTMENT_OTHER): Payer: PPO | Admitting: Cardiology

## 2023-10-23 ENCOUNTER — Other Ambulatory Visit (HOSPITAL_BASED_OUTPATIENT_CLINIC_OR_DEPARTMENT_OTHER): Payer: Self-pay

## 2023-10-23 ENCOUNTER — Encounter (HOSPITAL_BASED_OUTPATIENT_CLINIC_OR_DEPARTMENT_OTHER): Payer: Self-pay | Admitting: Cardiology

## 2023-10-23 VITALS — BP 112/72 | HR 65 | Ht 67.0 in | Wt 199.4 lb

## 2023-10-23 DIAGNOSIS — I5022 Chronic systolic (congestive) heart failure: Secondary | ICD-10-CM | POA: Diagnosis not present

## 2023-10-23 DIAGNOSIS — I10 Essential (primary) hypertension: Secondary | ICD-10-CM | POA: Diagnosis not present

## 2023-10-23 DIAGNOSIS — I2510 Atherosclerotic heart disease of native coronary artery without angina pectoris: Secondary | ICD-10-CM | POA: Diagnosis not present

## 2023-10-23 DIAGNOSIS — I480 Paroxysmal atrial fibrillation: Secondary | ICD-10-CM | POA: Diagnosis not present

## 2023-10-23 DIAGNOSIS — G4733 Obstructive sleep apnea (adult) (pediatric): Secondary | ICD-10-CM

## 2023-10-23 DIAGNOSIS — D6869 Other thrombophilia: Secondary | ICD-10-CM | POA: Diagnosis not present

## 2023-10-23 DIAGNOSIS — Z7901 Long term (current) use of anticoagulants: Secondary | ICD-10-CM

## 2023-10-23 DIAGNOSIS — I428 Other cardiomyopathies: Secondary | ICD-10-CM | POA: Diagnosis not present

## 2023-10-23 MED ORDER — RIVAROXABAN 20 MG PO TABS
20.0000 mg | ORAL_TABLET | Freq: Every day | ORAL | 3 refills | Status: DC
  Filled 2023-10-23: qty 90, 90d supply, fill #0

## 2023-10-23 MED ORDER — AMLODIPINE BESYLATE 10 MG PO TABS
10.0000 mg | ORAL_TABLET | Freq: Every day | ORAL | 3 refills | Status: AC
  Filled 2023-10-23: qty 90, 90d supply, fill #0
  Filled 2024-03-18: qty 90, 90d supply, fill #1
  Filled 2024-06-12: qty 90, 90d supply, fill #2
  Filled 2024-07-11: qty 90, 90d supply, fill #3

## 2023-10-23 MED ORDER — ENTRESTO 97-103 MG PO TABS
1.0000 | ORAL_TABLET | Freq: Two times a day (BID) | ORAL | 3 refills | Status: DC
  Filled 2023-10-23: qty 180, 90d supply, fill #0
  Filled 2024-03-02: qty 180, 90d supply, fill #1
  Filled 2024-05-26: qty 180, 90d supply, fill #2

## 2023-10-23 NOTE — Patient Instructions (Signed)
Medication Instructions:  Your physician recommends that you continue on your current medications as directed. Please refer to the Current Medication list given to you today.   Follow-Up: At [facility], you and your health needs are our priority.  As part of our continuing mission to provide you with exceptional heart care, we have created designated Provider Care Teams.  These Care Teams include your primary Cardiologist (physician) and Advanced Practice Providers (APPs -  Physician Assistants and Nurse Practitioners) who all work together to provide you with the care you need, when you need it.  We recommend signing up for the patient portal called "MyChart".  Sign up information is provided on this After Visit Summary.  MyChart is used to connect with patients for Virtual Visits (Telemedicine).  Patients are able to view lab/test results, encounter notes, upcoming appointments, etc.  Non-urgent messages can be sent to your provider as well.   To learn more about what you can do with MyChart, go to [email or web address].    Your next appointment:   6 months with Dr. Cristal Deer

## 2023-10-23 NOTE — Progress Notes (Signed)
 Cardiology Office Note:  .   Date:  10/23/2023  ID:  Juan Montes, DOB [birth year], MRN [id number] PCP: Rodrigo Ran, MD  [facility] Providers Cardiologist:  Jodelle Red, MD Electrophysiologist:  Will Jorja Loa, MD {  History of Present Illness: .   Juan Montes is a 73 y.o. 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 He was last seen by Theodore Demark on 05/14/18 and previously by Dr. Eden Emms in 2015.   Today: Overall doing well. Still with some fatigue, but able to do activities as long as he does them more slowly than he used to. 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   Notes that his left arm feels weak sometimes. No pain, tingling, or numbness. He will monitor this, recommending following up with Dr. Waynard Edwards if this progresses.  Has rare lightning fast, focal chest pain that last less than a breath. No other pains.  ROS: Denies shortness of breath at rest. No PND, orthopnea, LE edema or unexpected weight gain. No syncope or palpitations. ROS otherwise negative except as noted.   Studies Reviewed: Marland Kitchen    EKG:       Physical Exam:   VS:  BP 112/72   Pulse 65   Ht 5\' 7"  (1.702 m)   Wt 199 lb 6.4 oz (90.4 kg)   SpO2 97%   BMI 31.23 kg/m    Wt Readings from Last 3 Encounters:  10/23/23 199 lb 6.4 oz (90.4 kg)  04/24/23 199 lb 3.2 oz (90.4 kg)  10/21/22 198 lb 6.4 oz (90 kg)    GEN: Well nourished, well developed in no acute distress HEENT: Normal, moist mucous membranes NECK: No JVD CARDIAC: regular rhythm, normal S1 and S2, no rubs or gallops. No murmur. VASCULAR: Radial and DP pulses 2+ bilaterally. No carotid bruits RESPIRATORY:  Clear to auscultation without rales, wheezing or rhonchi  ABDOMEN: Soft, non-tender,  non-distended MUSCULOSKELETAL:  Ambulates independently SKIN: Warm and dry, no edema NEUROLOGIC:  Alert and oriented x 3. No focal neuro deficits noted. PSYCHIATRIC:  Normal affect    ASSESSMENT AND PLAN: .    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 EF improved to 45-50% post ablation -continue metoprolol succinate 100 mg daily -continue entresto max dose 97-103 BID -we have discussed SGLT2i in the past.  -counseled on red flag warning signs that need immediate medical attention   Hypertension:  -continue metoprolol, entresto as above -continue amlodipine 10 mg daily. 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 Declines statins, as he has had adverse events on atorvastatin and pravastatin -has annual physical coming up with Dr. Waynard Edwards to recheck lipids   CV risk counseling and prevention -recommend heart healthy/Mediterranean diet, with whole grains, fruits, vegetable, fish, lean meats, nuts, and olive oil. Limit salt. -recommend moderate walking, 3-5 times/week for 30-50 minutes each session. Aim for at least 150 minutes.week. Goal should be pace of 3 miles/hours, or walking 1.5 miles in 30 minutes -recommend avoidance of tobacco products. Avoid  excess alcohol.  Dispo: 6 months or sooner as needed.   Signed, Jodelle Red, MD   Jodelle Red, MD, PhD, [facility]  [facility]  [place]  Heart & Vascular at [facility] at [facility] [address] [place], [place] [address] [phone]

## 2023-11-25 DIAGNOSIS — J40 Bronchitis, not specified as acute or chronic: Secondary | ICD-10-CM | POA: Diagnosis not present

## 2023-11-25 DIAGNOSIS — R051 Acute cough: Secondary | ICD-10-CM | POA: Diagnosis not present

## 2023-11-25 DIAGNOSIS — I1310 Hypertensive heart and chronic kidney disease without heart failure, with stage 1 through stage 4 chronic kidney disease, or unspecified chronic kidney disease: Secondary | ICD-10-CM | POA: Diagnosis not present

## 2023-11-25 DIAGNOSIS — J069 Acute upper respiratory infection, unspecified: Secondary | ICD-10-CM | POA: Diagnosis not present

## 2023-11-25 DIAGNOSIS — R0602 Shortness of breath: Secondary | ICD-10-CM | POA: Diagnosis not present

## 2023-11-25 DIAGNOSIS — K047 Periapical abscess without sinus: Secondary | ICD-10-CM | POA: Diagnosis not present

## 2023-11-25 DIAGNOSIS — I5020 Unspecified systolic (congestive) heart failure: Secondary | ICD-10-CM | POA: Diagnosis not present

## 2023-11-25 DIAGNOSIS — I2510 Atherosclerotic heart disease of native coronary artery without angina pectoris: Secondary | ICD-10-CM | POA: Diagnosis not present

## 2023-11-25 DIAGNOSIS — I4891 Unspecified atrial fibrillation: Secondary | ICD-10-CM | POA: Diagnosis not present

## 2023-12-16 DIAGNOSIS — X32XXXA Exposure to sunlight, initial encounter: Secondary | ICD-10-CM | POA: Diagnosis not present

## 2023-12-16 DIAGNOSIS — L570 Actinic keratosis: Secondary | ICD-10-CM | POA: Diagnosis not present

## 2023-12-16 DIAGNOSIS — L720 Epidermal cyst: Secondary | ICD-10-CM | POA: Diagnosis not present

## 2023-12-16 DIAGNOSIS — M7031 Other bursitis of elbow, right elbow: Secondary | ICD-10-CM | POA: Diagnosis not present

## 2023-12-17 DIAGNOSIS — M7021 Olecranon bursitis, right elbow: Secondary | ICD-10-CM | POA: Diagnosis not present

## 2023-12-17 DIAGNOSIS — M25521 Pain in right elbow: Secondary | ICD-10-CM | POA: Diagnosis not present

## 2024-01-13 ENCOUNTER — Other Ambulatory Visit (HOSPITAL_BASED_OUTPATIENT_CLINIC_OR_DEPARTMENT_OTHER): Payer: Self-pay

## 2024-01-13 MED ORDER — SACUBITRIL-VALSARTAN 97-103 MG PO TABS: 1.0000 | ORAL_TABLET | Freq: Two times a day (BID) | ORAL | 1 refills | Status: DC

## 2024-01-13 MED ORDER — CLINDAMYCIN HCL 300 MG PO CAPS: 300.0000 mg | ORAL_CAPSULE | Freq: Three times a day (TID) | ORAL | 0 refills | Status: DC

## 2024-01-13 MED ORDER — EZETIMIBE 10 MG PO TABS
10.0000 mg | ORAL_TABLET | Freq: Every day | ORAL | 3 refills | Status: DC
  Filled 2024-01-13: qty 90, 90d supply, fill #0

## 2024-01-13 MED ORDER — SERTRALINE HCL 100 MG PO TABS
100.0000 mg | ORAL_TABLET | Freq: Every day | ORAL | 1 refills | Status: DC
  Filled 2024-01-13: qty 100, 100d supply, fill #0

## 2024-01-13 MED ORDER — RIVAROXABAN 20 MG PO TABS: 20.0000 mg | ORAL_TABLET | Freq: Every day | ORAL | 1 refills | Status: DC

## 2024-01-14 ENCOUNTER — Other Ambulatory Visit (HOSPITAL_BASED_OUTPATIENT_CLINIC_OR_DEPARTMENT_OTHER): Payer: Self-pay

## 2024-01-14 MED FILL — Metoprolol Succinate Tab ER 24HR 100 MG (Tartrate Equiv): ORAL | 90 days supply | Qty: 90 | Fill #0 | Status: AC

## 2024-01-22 DIAGNOSIS — R7301 Impaired fasting glucose: Secondary | ICD-10-CM | POA: Diagnosis not present

## 2024-01-22 DIAGNOSIS — Z125 Encounter for screening for malignant neoplasm of prostate: Secondary | ICD-10-CM | POA: Diagnosis not present

## 2024-01-22 DIAGNOSIS — R5383 Other fatigue: Secondary | ICD-10-CM | POA: Diagnosis not present

## 2024-01-22 DIAGNOSIS — E785 Hyperlipidemia, unspecified: Secondary | ICD-10-CM | POA: Diagnosis not present

## 2024-01-22 DIAGNOSIS — Z1212 Encounter for screening for malignant neoplasm of rectum: Secondary | ICD-10-CM | POA: Diagnosis not present

## 2024-01-22 DIAGNOSIS — I10 Essential (primary) hypertension: Secondary | ICD-10-CM | POA: Diagnosis not present

## 2024-01-29 DIAGNOSIS — N182 Chronic kidney disease, stage 2 (mild): Secondary | ICD-10-CM | POA: Diagnosis not present

## 2024-01-29 DIAGNOSIS — Z5181 Encounter for therapeutic drug level monitoring: Secondary | ICD-10-CM | POA: Diagnosis not present

## 2024-01-29 DIAGNOSIS — I428 Other cardiomyopathies: Secondary | ICD-10-CM | POA: Diagnosis not present

## 2024-01-29 DIAGNOSIS — D6869 Other thrombophilia: Secondary | ICD-10-CM | POA: Diagnosis not present

## 2024-01-29 DIAGNOSIS — I5020 Unspecified systolic (congestive) heart failure: Secondary | ICD-10-CM | POA: Diagnosis not present

## 2024-01-29 DIAGNOSIS — I130 Hypertensive heart and chronic kidney disease with heart failure and stage 1 through stage 4 chronic kidney disease, or unspecified chronic kidney disease: Secondary | ICD-10-CM | POA: Diagnosis not present

## 2024-01-29 DIAGNOSIS — E785 Hyperlipidemia, unspecified: Secondary | ICD-10-CM | POA: Diagnosis not present

## 2024-01-29 DIAGNOSIS — I4891 Unspecified atrial fibrillation: Secondary | ICD-10-CM | POA: Diagnosis not present

## 2024-01-29 DIAGNOSIS — G720 Drug-induced myopathy: Secondary | ICD-10-CM | POA: Diagnosis not present

## 2024-01-29 DIAGNOSIS — I2510 Atherosclerotic heart disease of native coronary artery without angina pectoris: Secondary | ICD-10-CM | POA: Diagnosis not present

## 2024-01-29 DIAGNOSIS — M7021 Olecranon bursitis, right elbow: Secondary | ICD-10-CM | POA: Diagnosis not present

## 2024-01-29 DIAGNOSIS — I10 Essential (primary) hypertension: Secondary | ICD-10-CM | POA: Diagnosis not present

## 2024-01-29 DIAGNOSIS — Z0000 Encounter for general adult medical examination without abnormal findings: Secondary | ICD-10-CM | POA: Diagnosis not present

## 2024-01-29 DIAGNOSIS — F419 Anxiety disorder, unspecified: Secondary | ICD-10-CM | POA: Diagnosis not present

## 2024-01-29 DIAGNOSIS — R82998 Other abnormal findings in urine: Secondary | ICD-10-CM | POA: Diagnosis not present

## 2024-02-02 DIAGNOSIS — M19021 Primary osteoarthritis, right elbow: Secondary | ICD-10-CM | POA: Diagnosis not present

## 2024-02-02 DIAGNOSIS — M7021 Olecranon bursitis, right elbow: Secondary | ICD-10-CM | POA: Diagnosis not present

## 2024-02-07 ENCOUNTER — Other Ambulatory Visit (HOSPITAL_BASED_OUTPATIENT_CLINIC_OR_DEPARTMENT_OTHER): Payer: Self-pay | Admitting: Cardiology

## 2024-02-07 DIAGNOSIS — I480 Paroxysmal atrial fibrillation: Secondary | ICD-10-CM

## 2024-02-09 NOTE — Telephone Encounter (Signed)
 Prescription refill request for Xarelto  received.  Indication: a fib Last office visit: 10/23/23 Weight: 199 Age: 73 Scr: 1.0 care everywehre 01/22/24 CrCl:  85 ml/min

## 2024-02-10 ENCOUNTER — Other Ambulatory Visit (HOSPITAL_BASED_OUTPATIENT_CLINIC_OR_DEPARTMENT_OTHER): Payer: Self-pay

## 2024-02-10 MED FILL — Rivaroxaban Tab 20 MG: ORAL | 90 days supply | Qty: 90 | Fill #0 | Status: AC

## 2024-03-02 ENCOUNTER — Other Ambulatory Visit (HOSPITAL_BASED_OUTPATIENT_CLINIC_OR_DEPARTMENT_OTHER): Payer: Self-pay

## 2024-03-23 ENCOUNTER — Ambulatory Visit (INDEPENDENT_AMBULATORY_CARE_PROVIDER_SITE_OTHER): Admitting: Cardiology

## 2024-03-23 ENCOUNTER — Encounter (HOSPITAL_BASED_OUTPATIENT_CLINIC_OR_DEPARTMENT_OTHER): Payer: Self-pay | Admitting: Cardiology

## 2024-03-23 VITALS — BP 116/74 | HR 61 | Ht 67.0 in | Wt 201.8 lb

## 2024-03-23 DIAGNOSIS — I5022 Chronic systolic (congestive) heart failure: Secondary | ICD-10-CM

## 2024-03-23 DIAGNOSIS — I2510 Atherosclerotic heart disease of native coronary artery without angina pectoris: Secondary | ICD-10-CM

## 2024-03-23 DIAGNOSIS — E7800 Pure hypercholesterolemia, unspecified: Secondary | ICD-10-CM | POA: Diagnosis not present

## 2024-03-23 DIAGNOSIS — I428 Other cardiomyopathies: Secondary | ICD-10-CM

## 2024-03-23 DIAGNOSIS — I480 Paroxysmal atrial fibrillation: Secondary | ICD-10-CM

## 2024-03-23 DIAGNOSIS — I10 Essential (primary) hypertension: Secondary | ICD-10-CM

## 2024-03-23 DIAGNOSIS — R5383 Other fatigue: Secondary | ICD-10-CM | POA: Diagnosis not present

## 2024-03-23 DIAGNOSIS — Z7901 Long term (current) use of anticoagulants: Secondary | ICD-10-CM | POA: Diagnosis not present

## 2024-03-23 DIAGNOSIS — D6869 Other thrombophilia: Secondary | ICD-10-CM

## 2024-03-23 NOTE — Patient Instructions (Addendum)
 Medication Instructions:  Your physician recommends that you continue on your current medications as directed. Please refer to the Current Medication list given to you today.  *If you need a refill on your cardiac medications before your next appointment, please call your pharmacy*  Lab Work: NONE  Testing/Procedures: NONE  Follow-Up:  Follow up in 6 mos, you can cancel the visit in September and do 6 mos from now.   Look into the podcast Nothing much happens. Look into sleep hygiene patterns too. I'd recommend stopping screens about an hour before you want to go to bed.

## 2024-03-23 NOTE — Progress Notes (Signed)
 Cardiology Office Note:  .   Date:  03/23/2024  ID:  [name], DOB 1950-12-14, MRN [id number] PCP: Shayne Anes, MD  [facility] Providers Cardiologist:  Shelda Bruckner, MD Electrophysiologist:  Will Gladis Norton, MD {  History of Present Illness: .   Juan Montes is a 73 y.o. 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 He was last seen by Shona Shad on 05/14/18 and previously by Dr. Delford in 2015.   Today: Has had a stressful week. Was worried he was in afib. Has felt fatigued. No clear shortness of breath, no chest pain or other pain. No fevers/chills/cough. No lightheadedness. Wife is with him today. Has gained a little weight, working on getting back to normal eating. No physical limitations. Sleep was off, though better the last few nights.   Checked blood pressure at [facility] on Friday as he felt poorly, was 130/87.  ROS: Denies shortness of breath at rest. No PND, orthopnea, LE edema or unexpected weight gain. No syncope or palpitations. ROS otherwise negative except as noted.   Studies Reviewed: SABRA    EKG:  EKG Interpretation Date/Time:  Tuesday March 23 2024 13:34:15 EDT Ventricular Rate:  61 PR Interval:  200 QRS Duration:  98 QT Interval:  424 QTC Calculation: 426 R Axis:   15  Text Interpretation: Normal sinus rhythm Nonspecific ST abnormality When compared with ECG of 24-Apr-2023 15:05, No significant change was found Confirmed by Bruckner Shelda [phone]) on 03/23/2024 1:43:07 PM    Physical Exam:   VS:  BP 100/70   Pulse 61   Ht 5' 7 (1.702 m)   Wt 201 lb 12.8 oz (91.5 kg)   SpO2 98%   BMI 31.61 kg/m    Wt Readings from Last 3 Encounters:  03/23/24 201 lb 12.8 oz (91.5 kg)  10/23/23 199 lb 6.4 oz (90.4 kg)  04/24/23 199 lb 3.2 oz (90.4 kg)    GEN: Well  nourished, well developed in no acute distress HEENT: Normal, moist mucous membranes NECK: No JVD CARDIAC: regular rhythm, normal S1 and S2, no rubs or gallops. No murmur. VASCULAR: Radial and DP pulses 2+ bilaterally. No carotid bruits RESPIRATORY:  Clear to auscultation without rales, wheezing or rhonchi  ABDOMEN: Soft, non-tender, non-distended MUSCULOSKELETAL:  Ambulates independently SKIN: Warm and dry, no edema NEUROLOGIC:  Alert and oriented x 3. No focal neuro deficits noted. PSYCHIATRIC:  Normal affect    ASSESSMENT AND PLAN: .    Fatigue/feeling off: -concern was for afib, but in sinus today -suspect 2/2 recent stress -reviewed stress management, sleep hygiene  Chronic combined systolic and diastolic heart failure nonischemic cardiomyopathy Improved EF -NYHA class I today. EF improved to 45-50% post ablation -continue metoprolol  succinate 100 mg daily -continue entresto  max dose 97-103 BID -we have discussed SGLT2i in the past.  -counseled on red flag warning signs that need immediate medical attention   Hypertension:  -continue metoprolol , entresto  as above -continue amlodipine  10 mg daily. 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 Declines statins, as he has had  adverse events on atorvastatin and pravastatin   CV risk counseling and prevention -recommend heart healthy/Mediterranean diet, with whole grains, fruits, vegetable, fish, lean meats, nuts, and olive oil. Limit salt. -recommend moderate walking, 3-5 times/week for 30-50 minutes each session. Aim for at least 150 minutes.week. Goal should be pace of 3 miles/hours, or walking 1.5 miles in 30  minutes -recommend avoidance of tobacco products. Avoid excess alcohol .  Dispo: 6 months or sooner as needed.   Signed, Shelda Bruckner, MD   Shelda Bruckner, MD, PhD, [facility] [place]  [facility]  [place]  Heart & Vascular at [facility] at [facility] [address] [place], [place] [address] [phone]

## 2024-04-15 ENCOUNTER — Other Ambulatory Visit: Payer: Self-pay

## 2024-04-15 ENCOUNTER — Other Ambulatory Visit (HOSPITAL_BASED_OUTPATIENT_CLINIC_OR_DEPARTMENT_OTHER): Payer: Self-pay

## 2024-04-15 MED ORDER — SERTRALINE HCL 100 MG PO TABS
100.0000 mg | ORAL_TABLET | Freq: Every day | ORAL | 3 refills | Status: AC
  Filled 2024-04-15: qty 100, 100d supply, fill #0
  Filled 2024-07-11: qty 100, 100d supply, fill #1

## 2024-04-15 MED ORDER — EZETIMIBE 10 MG PO TABS
10.0000 mg | ORAL_TABLET | Freq: Every day | ORAL | 3 refills | Status: AC
  Filled 2024-04-15: qty 100, 100d supply, fill #0
  Filled 2024-07-11: qty 100, 100d supply, fill #1

## 2024-04-15 MED FILL — Metoprolol Succinate Tab ER 24HR 100 MG (Tartrate Equiv): ORAL | 90 days supply | Qty: 90 | Fill #1 | Status: AC

## 2024-04-21 ENCOUNTER — Ambulatory Visit (HOSPITAL_BASED_OUTPATIENT_CLINIC_OR_DEPARTMENT_OTHER): Admitting: Cardiology

## 2024-05-10 ENCOUNTER — Other Ambulatory Visit (HOSPITAL_BASED_OUTPATIENT_CLINIC_OR_DEPARTMENT_OTHER): Payer: Self-pay

## 2024-05-10 DIAGNOSIS — E559 Vitamin D deficiency, unspecified: Secondary | ICD-10-CM | POA: Diagnosis not present

## 2024-05-10 MED FILL — Rivaroxaban Tab 20 MG: ORAL | 90 days supply | Qty: 90 | Fill #1 | Status: AC

## 2024-05-20 ENCOUNTER — Other Ambulatory Visit (HOSPITAL_BASED_OUTPATIENT_CLINIC_OR_DEPARTMENT_OTHER): Payer: Self-pay

## 2024-05-20 MED ORDER — AMOXICILLIN 500 MG PO CAPS
500.0000 mg | ORAL_CAPSULE | Freq: Three times a day (TID) | ORAL | 0 refills | Status: AC
  Filled 2024-05-20: qty 21, 7d supply, fill #0

## 2024-05-27 ENCOUNTER — Telehealth: Payer: Self-pay | Admitting: Pharmacy Technician

## 2024-05-27 ENCOUNTER — Other Ambulatory Visit (HOSPITAL_COMMUNITY): Payer: Self-pay

## 2024-05-27 ENCOUNTER — Other Ambulatory Visit (HOSPITAL_BASED_OUTPATIENT_CLINIC_OR_DEPARTMENT_OTHER): Payer: Self-pay

## 2024-05-27 ENCOUNTER — Other Ambulatory Visit (HOSPITAL_BASED_OUTPATIENT_CLINIC_OR_DEPARTMENT_OTHER): Payer: Self-pay | Admitting: Cardiology

## 2024-05-27 DIAGNOSIS — I5022 Chronic systolic (congestive) heart failure: Secondary | ICD-10-CM

## 2024-05-27 DIAGNOSIS — I428 Other cardiomyopathies: Secondary | ICD-10-CM

## 2024-05-27 NOTE — Telephone Encounter (Signed)
 Insurance will pay for brand entresto , sent message to see if can be sent in for 0.00

## 2024-05-28 ENCOUNTER — Other Ambulatory Visit (HOSPITAL_BASED_OUTPATIENT_CLINIC_OR_DEPARTMENT_OTHER): Payer: Self-pay

## 2024-05-28 MED ORDER — ENTRESTO 97-103 MG PO TABS
1.0000 | ORAL_TABLET | Freq: Two times a day (BID) | ORAL | 3 refills | Status: AC
  Filled 2024-05-28: qty 180, 90d supply, fill #0
  Filled 2024-07-11: qty 180, 90d supply, fill #1
  Filled 2024-08-31 – 2024-09-01 (×2): qty 60, 30d supply, fill #1

## 2024-05-28 NOTE — Telephone Encounter (Signed)
 Juan Montes, CPhT to Juan Montes, [facility]  Cv Div Dwb Triage 05/27/24  1:29 PM Insurance will pay for brand entresto . Can someone please send in brand entresto  with daw y1-brand per provider? insurance will pay for it with copay of 0.00. thank you!  ____________________________________________________  Sent prescription as requested above.

## 2024-06-03 ENCOUNTER — Other Ambulatory Visit (HOSPITAL_BASED_OUTPATIENT_CLINIC_OR_DEPARTMENT_OTHER): Payer: Self-pay

## 2024-06-03 MED ORDER — FLUZONE HIGH-DOSE 0.5 ML IM SUSY
0.5000 mL | PREFILLED_SYRINGE | Freq: Once | INTRAMUSCULAR | 0 refills | Status: AC
  Filled 2024-06-03: qty 0.5, 1d supply, fill #0

## 2024-06-21 DIAGNOSIS — M25561 Pain in right knee: Secondary | ICD-10-CM | POA: Diagnosis not present

## 2024-06-21 DIAGNOSIS — I130 Hypertensive heart and chronic kidney disease with heart failure and stage 1 through stage 4 chronic kidney disease, or unspecified chronic kidney disease: Secondary | ICD-10-CM | POA: Diagnosis not present

## 2024-07-11 ENCOUNTER — Other Ambulatory Visit (HOSPITAL_BASED_OUTPATIENT_CLINIC_OR_DEPARTMENT_OTHER): Payer: Self-pay | Admitting: Cardiology

## 2024-07-11 DIAGNOSIS — I480 Paroxysmal atrial fibrillation: Secondary | ICD-10-CM

## 2024-07-11 MED FILL — Metoprolol Succinate Tab ER 24HR 100 MG (Tartrate Equiv): ORAL | 90 days supply | Qty: 90 | Fill #2 | Status: AC

## 2024-07-12 ENCOUNTER — Other Ambulatory Visit: Payer: Self-pay

## 2024-07-12 ENCOUNTER — Other Ambulatory Visit (HOSPITAL_BASED_OUTPATIENT_CLINIC_OR_DEPARTMENT_OTHER): Payer: Self-pay

## 2024-07-12 MED ORDER — RIVAROXABAN 20 MG PO TABS
20.0000 mg | ORAL_TABLET | Freq: Every day | ORAL | 1 refills | Status: AC
  Filled 2024-07-12 – 2024-08-02 (×2): qty 90, 90d supply, fill #0

## 2024-07-12 NOTE — Telephone Encounter (Signed)
 Pt last saw Dr Lonni 03/23/24, last labs 1.0 on 01/22/24 per care everywhere, age 73, weight 91.5kg, CrCl 86.42, based on CrCl pt is on appropriate dosage of Xarelto  20mg  every day for afib.  Will refill rx.

## 2024-07-20 ENCOUNTER — Other Ambulatory Visit (HOSPITAL_BASED_OUTPATIENT_CLINIC_OR_DEPARTMENT_OTHER): Payer: Self-pay

## 2024-08-02 ENCOUNTER — Other Ambulatory Visit (HOSPITAL_BASED_OUTPATIENT_CLINIC_OR_DEPARTMENT_OTHER): Payer: Self-pay

## 2024-08-04 ENCOUNTER — Other Ambulatory Visit (HOSPITAL_BASED_OUTPATIENT_CLINIC_OR_DEPARTMENT_OTHER): Payer: Self-pay

## 2024-08-31 ENCOUNTER — Other Ambulatory Visit (HOSPITAL_BASED_OUTPATIENT_CLINIC_OR_DEPARTMENT_OTHER): Payer: Self-pay

## 2024-09-01 ENCOUNTER — Other Ambulatory Visit (HOSPITAL_COMMUNITY): Payer: Self-pay

## 2024-09-01 ENCOUNTER — Other Ambulatory Visit (HOSPITAL_BASED_OUTPATIENT_CLINIC_OR_DEPARTMENT_OTHER): Payer: Self-pay
